# Patient Record
Sex: Female | Born: 1970 | Race: White | Hispanic: Yes | Marital: Married | Smoking: Never smoker
Health system: Southern US, Community
[De-identification: ages and names within clinical notes are randomized; demographics above are authoritative.]

## PROBLEM LIST (undated history)

## (undated) DIAGNOSIS — C539 Malignant neoplasm of cervix uteri, unspecified: Secondary | ICD-10-CM

## (undated) DIAGNOSIS — I8390 Asymptomatic varicose veins of unspecified lower extremity: Secondary | ICD-10-CM

## (undated) DIAGNOSIS — Z8742 Personal history of other diseases of the female genital tract: Secondary | ICD-10-CM

## (undated) HISTORY — DX: Asymptomatic varicose veins of unspecified lower extremity: I83.90

---

## 2003-07-25 ENCOUNTER — Inpatient Hospital Stay (HOSPITAL_COMMUNITY): Admission: AD | Admit: 2003-07-25 | Discharge: 2003-07-28 | Payer: Self-pay | Admitting: *Deleted

## 2003-07-25 ENCOUNTER — Encounter (INDEPENDENT_AMBULATORY_CARE_PROVIDER_SITE_OTHER): Payer: Self-pay | Admitting: *Deleted

## 2004-11-09 ENCOUNTER — Encounter (INDEPENDENT_AMBULATORY_CARE_PROVIDER_SITE_OTHER): Payer: Self-pay | Admitting: Nurse Practitioner

## 2004-11-09 LAB — CONVERTED CEMR LAB

## 2004-11-13 ENCOUNTER — Encounter (INDEPENDENT_AMBULATORY_CARE_PROVIDER_SITE_OTHER): Payer: Self-pay | Admitting: Nurse Practitioner

## 2004-11-13 LAB — CONVERTED CEMR LAB: Chlamydia, DNA Probe: NEGATIVE

## 2007-10-29 ENCOUNTER — Ambulatory Visit (HOSPITAL_COMMUNITY): Admission: RE | Admit: 2007-10-29 | Discharge: 2007-10-29 | Payer: Self-pay | Admitting: Obstetrics

## 2007-12-27 ENCOUNTER — Inpatient Hospital Stay (HOSPITAL_COMMUNITY): Admission: AD | Admit: 2007-12-27 | Discharge: 2007-12-27 | Payer: Self-pay | Admitting: Obstetrics

## 2007-12-27 ENCOUNTER — Inpatient Hospital Stay (HOSPITAL_COMMUNITY): Admission: AD | Admit: 2007-12-27 | Discharge: 2007-12-30 | Payer: Self-pay | Admitting: Obstetrics

## 2007-12-28 ENCOUNTER — Encounter: Payer: Self-pay | Admitting: Obstetrics & Gynecology

## 2008-08-15 ENCOUNTER — Encounter (INDEPENDENT_AMBULATORY_CARE_PROVIDER_SITE_OTHER): Payer: Self-pay | Admitting: Internal Medicine

## 2008-08-15 ENCOUNTER — Ambulatory Visit: Payer: Self-pay | Admitting: Nurse Practitioner

## 2008-08-15 LAB — CONVERTED CEMR LAB
Glucose, Urine, Semiquant: NEGATIVE
Nitrite: NEGATIVE
Protein, U semiquant: NEGATIVE

## 2008-08-16 ENCOUNTER — Encounter (INDEPENDENT_AMBULATORY_CARE_PROVIDER_SITE_OTHER): Payer: Self-pay | Admitting: Nurse Practitioner

## 2008-08-23 ENCOUNTER — Encounter (INDEPENDENT_AMBULATORY_CARE_PROVIDER_SITE_OTHER): Payer: Self-pay | Admitting: Nurse Practitioner

## 2008-09-05 ENCOUNTER — Encounter (INDEPENDENT_AMBULATORY_CARE_PROVIDER_SITE_OTHER): Payer: Self-pay | Admitting: Nurse Practitioner

## 2008-09-19 ENCOUNTER — Ambulatory Visit: Payer: Self-pay | Admitting: Nurse Practitioner

## 2008-09-19 ENCOUNTER — Encounter (INDEPENDENT_AMBULATORY_CARE_PROVIDER_SITE_OTHER): Payer: Self-pay | Admitting: Nurse Practitioner

## 2008-09-19 LAB — CONVERTED CEMR LAB
ALT: 10 units/L (ref 0–35)
AST: 13 units/L (ref 0–37)
Basophils Absolute: 0 10*3/uL (ref 0.0–0.1)
Basophils Relative: 1 % (ref 0–1)
Bilirubin Urine: NEGATIVE
Calcium: 8.9 mg/dL (ref 8.4–10.5)
Chlamydia, DNA Probe: NEGATIVE
Chloride: 107 meq/L (ref 96–112)
Creatinine, Ser: 0.62 mg/dL (ref 0.40–1.20)
KOH Prep: NEGATIVE
LDL Cholesterol: 88 mg/dL (ref 0–99)
MCHC: 31.6 g/dL (ref 30.0–36.0)
Monocytes Absolute: 0.5 10*3/uL (ref 0.1–1.0)
Neutro Abs: 3.5 10*3/uL (ref 1.7–7.7)
Neutrophils Relative %: 58 % (ref 43–77)
Potassium: 4.8 meq/L (ref 3.5–5.3)
RDW: 13.5 % (ref 11.5–15.5)
Specific Gravity, Urine: 1.015
TSH: 0.898 microintl units/mL (ref 0.350–4.50)
Total CHOL/HDL Ratio: 3.1
pH: 6.5

## 2008-09-20 ENCOUNTER — Encounter (INDEPENDENT_AMBULATORY_CARE_PROVIDER_SITE_OTHER): Payer: Self-pay | Admitting: Nurse Practitioner

## 2008-10-06 ENCOUNTER — Ambulatory Visit: Payer: Self-pay | Admitting: Nurse Practitioner

## 2009-09-19 ENCOUNTER — Ambulatory Visit: Payer: Self-pay | Admitting: Nurse Practitioner

## 2009-09-19 DIAGNOSIS — R3129 Other microscopic hematuria: Secondary | ICD-10-CM | POA: Insufficient documentation

## 2009-09-19 LAB — CONVERTED CEMR LAB
ALT: 10 U/L
AST: 12 U/L
Albumin: 4 g/dL
Alkaline Phosphatase: 87 U/L
BUN: 18 mg/dL
Basophils Absolute: 0 10*3/uL
Basophils Relative: 0 %
CO2: 26 meq/L
Calcium: 8.4 mg/dL
Chlamydia, DNA Probe: NEGATIVE
Chloride: 106 meq/L
Cholesterol: 154 mg/dL
Creatinine, Ser: 0.63 mg/dL
Eosinophils Absolute: 0.1 10*3/uL
Eosinophils Relative: 1 %
GC Probe Amp, Genital: NEGATIVE
Glucose, Bld: 82 mg/dL
Glucose, Urine, Semiquant: NEGATIVE
HCT: 41.1 %
HDL: 48 mg/dL
Hemoglobin: 12.9 g/dL
KOH Prep: NEGATIVE
Ketones, urine, test strip: NEGATIVE
LDL Cholesterol: 90 mg/dL
Lymphocytes Relative: 33 %
Lymphs Abs: 2.2 10*3/uL
MCHC: 31.4 g/dL
MCV: 89 fL
Monocytes Absolute: 0.5 10*3/uL
Monocytes Relative: 7 %
Neutro Abs: 3.9 10*3/uL
Neutrophils Relative %: 59 %
Platelets: 179 10*3/uL
Potassium: 4.5 meq/L
RBC: 4.62 M/uL
RDW: 13.8 %
Sodium: 141 meq/L
Specific Gravity, Urine: 1.02
TSH: 1.426 u[IU]/mL
Total Bilirubin: 0.5 mg/dL
Total CHOL/HDL Ratio: 3.2
Total Protein: 7.2 g/dL
Triglycerides: 78 mg/dL
VLDL: 16 mg/dL
WBC: 6.7 10*3/uL
pH: 7

## 2009-09-20 ENCOUNTER — Ambulatory Visit: Payer: Self-pay | Admitting: Nurse Practitioner

## 2009-10-27 ENCOUNTER — Ambulatory Visit: Payer: Self-pay | Admitting: Nurse Practitioner

## 2009-10-27 DIAGNOSIS — IMO0002 Reserved for concepts with insufficient information to code with codable children: Secondary | ICD-10-CM | POA: Insufficient documentation

## 2009-10-27 DIAGNOSIS — B354 Tinea corporis: Secondary | ICD-10-CM | POA: Insufficient documentation

## 2010-09-23 ENCOUNTER — Encounter: Payer: Self-pay | Admitting: Obstetrics

## 2010-10-02 NOTE — Letter (Signed)
Summary: *HSN Results Follow up  [facility]  [address] [place], [place] [address]   Phone: [phone]  Fax: [phone]      09/20/2009   Krista Cross [address] [place], [place]  [address]   Dear  Ms. Krista Cross,                            ____S.Drinkard,FNP   ____D. Gore,FNP       ____B. McPherson,MD   ____V. Rankins,MD    ____E. Mulberry,MD    _X___N. Daphine Deutscher, FNP  ____D. Reche Dixon, MD    ____K. Philipp Deputy, MD    ____Other     This letter is to inform you that your recent test(s):  ___X____Pap Smear    ___X____Lab Test     _______X-ray    ___X____ is within acceptable limits  _______ requires a medication change  _______ requires a follow-up lab visit  _______ requires a follow-up visit with your provider   Comments: Labs done during recent office visit are normal.  Pap Smear results _____________________________.     _________________________________________________________ If you have any questions, please contact our office [phone].                    Sincerely,    Lehman Prom FNP [facility]

## 2010-10-02 NOTE — Progress Notes (Signed)
Summary: Office Visit/DEPRESSION SCREENING  Office Visit/DEPRESSION SCREENING   Imported By: Arta Bruce 11/02/2009 12:55:22  _____________________________________________________________________  External Attachment:    Type:   Image     Comment:   External Document

## 2010-10-02 NOTE — Assessment & Plan Note (Signed)
Summary: CLEANING OF LEFT EAR///GK  Nurse Visit   Allergies: No Known Drug Allergies  Orders Added: 1)  Est. Patient Nurse visit [09003] 2)  Cerumen Impaction Removal [16109] Pt tolerated procedure well................... Tiffany McCoy CMA  September 20, 2009 10:10 AM

## 2010-10-02 NOTE — Assessment & Plan Note (Signed)
Summary: Complete Physical Exam   Vital Signs:  Patient profile:   40 year old female Menstrual status:  regular LMP:     08/22/2009 Height:      58.50 inches Weight:      139.1 pounds BMI:     28.68 BSA:     1.57 Temp:     98.0 degrees F oral Pulse rate:   77 / minute Pulse rhythm:   regular Resp:     20 per minute BP sitting:   94 / 65  (left arm) Cuff size:   regular  Vitals Entered By: Levon Hedger (September 19, 2009 9:23 AM) CC: CPP Is Patient Diabetic? No Pain Assessment Patient in pain? no       Does patient need assistance? Functional Status Self care Ambulation Normal LMP (date): 08/22/2009 LMP - Character: normal     Menstrual flow (days): 6 Menstrual Status regular Enter LMP: 08/22/2009 Last PAP Result  Specimen Adequacy: Satisfactory for evaluation.   Interpretation/Result:Negative for intraepithelial Lesion or Malignancy.      CC:  CPP.  History of Present Illness:  Pt into the office for a complete physical exam  PAP - done last year. All normal PAP's S/p tubal ligation 4 children Regular menses - monthly No family hx of cervical or ovarian cancer  Mammogram - never had a mammogram. No family hx of breast cancer  Optho - no glasses or contacts. no current problems with her eyes  Dental - no recent dental exam.  She has been but it has been over 1 year.  tdap - up to date; done last in 2004  No current medications.  No acute problems today  Habits & Providers  Alcohol-Tobacco-Diet     Alcohol drinks/day: 0     Tobacco Status: never  Exercise-Depression-Behavior     Does Patient Exercise: yes     Exercise Counseling: to improve exercise regimen     Type of exercise: gym     Have you felt down or hopeless? no     Have you felt little pleasure in things? no     Depression Counseling: not indicated; screening negative for depression     Drug Use: no     Seat Belt Use: 100     Sun Exposure: occasionally  Comments: PHQ- 9  score = 1  Medications Prior to Update: 1)  None  Allergies (verified): No Known Drug Allergies  Review of Systems General:  Denies fever. Eyes:  Denies blurring. ENT:  Denies earache. CV:  Denies chest pain or discomfort. Resp:  Denies cough. GI:  Denies abdominal pain, constipation, nausea, and vomiting. GU:  Denies discharge. MS:  Denies joint pain. Derm:  Denies rash. Neuro:  Denies headaches. Psych:  Denies anxiety and depression. Endo:  Denies excessive urination.  Physical Exam  General:  alert.   Head:  normocephalic.   Eyes:  vision grossly intact, pupils equal, and pupils round.   Ears:  left ear - cerumen impaction right ear - TM visible, no erythema Nose:  no external deformity.   Mouth:  pharynx pink and moist and fair dentition.   Neck:  supple.   Chest Wall:  no mass.   Breasts:  skin/areolae normal, no masses, and no abnormal thickening.   Lungs:  normal breath sounds.   Heart:  normal rate and regular rhythm.   Abdomen:  soft, non-tender, and normal bowel sounds.   Rectal:  no external abnormalities.    Pelvic Exam  Vulva:  normal appearance.   Urethra and Bladder:      Urethra--normal.   Vagina:      physiologic discharge.   Cervix:      left side deviation Uterus:      smooth.   Adnexa:      nontender bilaterally.      Impression & Recommendations:  Problem # 1:  ROUTINE GYNECOLOGICAL EXAMINATION (ICD-V72.31) PAP done labs done PHQ- 9 score is 1 rec optho and dental exams self breast exam handout given Orders: UA Dipstick w/o Micro (manual) (16109) T-Lipid Profile (60454-09811) T-Comprehensive Metabolic Panel [phone]) T-CBC w/Diff (13086-57846) T-HIV Antibody  (Reflex) [phone]) T-TSH [phone]) T- GC Chlamydia (36644) KOH/ WET Mount [phone]) Pap Smear, Thin Prep ( Collection of) (Q5956) T-Culture, Urine ([id number])  Problem # 2:  NEED PROPHYLACTIC VACCINATION&INOCULATION FLU (ICD-V04.81) given today in  office  Other Orders: Flu Vaccine 63yrs + (51884) Admin 1st Vaccine (16606) Admin 1st Vaccine [facility]) (30160F)  Patient Instructions: 1)  You will be informed of any abnormal lab results. 2)  Schedule a nurse visit for cleaning of your left ear. 3)  Follow up for a complete physical in 1 year or sooner if necessary  Laboratory Results   Urine Tests  Date/Time Received: September 19, 2009 9:32 AM  Date/Time Reported: September 19, 2009 9:32 AM   Routine Urinalysis   Color: yellow Appearance: Clear Glucose: negative   (Normal Range: Negative) Bilirubin: negative   (Normal Range: Negative) Ketone: negative   (Normal Range: Negative) Spec. Gravity: 1.020   (Normal Range: 1.003-1.035) Blood: trace-lysed   (Normal Range: Negative) pH: 7.0   (Normal Range: 5.0-8.0) Protein: trace   (Normal Range: Negative) Urobilinogen: 0.2   (Normal Range: 0-1) Nitrite: negative   (Normal Range: Negative) Leukocyte Esterace: negative   (Normal Range: Negative)    Date/Time Received: September 19, 2009 1:46 PM   Wet Mount/KOH Source: vaginal WBC/hpf: 1-5 Bacteria/hpf: rare Clue cells/hpf: none Yeast/hpf: none Trichomonas/hpf: none    Prevention & Chronic Care Immunizations   Influenza vaccine: Fluvax 3+  (09/19/2009)   Influenza vaccine due: 05/03/2010    Tetanus booster: 09/02/2002: historical per pt    Pneumococcal vaccine: Not documented  Other Screening   Pap smear:  Specimen Adequacy: Satisfactory for evaluation.   Interpretation/Result:Negative for intraepithelial Lesion or Malignancy.     (09/19/2008)   Pap smear action/deferral: PAP smear done  (09/19/2008)   Pap smear due: 09/19/2010   Smoking status: never  (09/19/2009)  Lipids   Total Cholesterol: 150  (09/19/2008)   Lipid panel action/deferral: Lipid Panel ordered   LDL: 88  (09/19/2008)   LDL Direct: Not documented   HDL: 49  (09/19/2008)   Triglycerides: 63  (09/19/2008)   Nursing Instructions: Give Flu  vaccine today    Influenza Vaccine    Vaccine Type: Fluvax 3+    Site: right deltoid    Mfr: Sanofi Pasteur    Dose: 0.5 ml    Route: IM    Given by: Levon Hedger    Exp. Date: 03/01/2010    Lot #: [id number]    VIS given: 03/26/07 version given September 19, 2009.  Flu Vaccine Consent Questions    Do you have a history of severe allergic reactions to this vaccine? no    Any prior history of allergic reactions to egg and/or gelatin? no    Do you have a sensitivity to the preservative Thimersol? no    Do you have a past history of Guillan-Barre Syndrome? no  Do you currently have an acute febrile illness? no    Have you ever had a severe reaction to latex? no    Vaccine information given and explained to patient? yes    Are you currently pregnant? no   Laboratory Results   Urine Tests    Routine Urinalysis   Color: yellow Appearance: Clear Glucose: negative   (Normal Range: Negative) Bilirubin: negative   (Normal Range: Negative) Ketone: negative   (Normal Range: Negative) Spec. Gravity: 1.020   (Normal Range: 1.003-1.035) Blood: trace-lysed   (Normal Range: Negative) pH: 7.0   (Normal Range: 5.0-8.0) Protein: trace   (Normal Range: Negative) Urobilinogen: 0.2   (Normal Range: 0-1) Nitrite: negative   (Normal Range: Negative) Leukocyte Esterace: negative   (Normal Range: Negative)      Wet Mount Wet Mount KOH: Negative

## 2010-10-02 NOTE — Letter (Signed)
Summary: Handout Printed  Printed Handout:  - Ringworm - Body (Tinea Corporis) 

## 2010-10-02 NOTE — Assessment & Plan Note (Signed)
Summary: Acute - Tinea Corporis   Vital Signs:  Patient profile:   40 year old female Menstrual status:  regular Weight:      137.3 pounds Temp:     98.3 degrees F oral Pulse rate:   82 / minute Pulse rhythm:   regular Resp:     20 per minute BP sitting:   92 / 61  (left arm) Cuff size:   regular  Vitals Entered By: Levon Hedger (October 27, 2009 2:15 PM)  History of Present Illness:  Pt into the office with complaints of pain in her back. Right mid - back present for the past week. Denies nausea or vomiting No fever Denies any heavy lifting or straining No previous history of pain in this area in the past  Skin rash - presented about 2 weeks ago.  She has been using a cream to the affected area. She has been applying vasoline to the affected area Only itching when she applies the cream otherwise no itching area on left upper arm present initially +discoloration Reports that daughter had a similar rash on her face and her provider gave her cream which she used and it improved.  Pt states that she used some of her daughter's cream but her areas did not resolve. No pets  Pt is not employed outside her home  Spanish speaking  Allergies: No Known Drug Allergies  Review of Systems General:  Denies fever. CV:  Denies chest pain or discomfort. Resp:  Denies cough. GI:  Denies abdominal pain, nausea, and vomiting. MS:  left posterior shoulder. Derm:  Complains of lesion(s) and rash; rash to arm and legs.  Physical Exam  General:  alert.   Head:  normocephalic.   Eyes:  vision grossly intact, pupils equal, and pupils round.   Lungs:  normal breath sounds.   Heart:  normal rate and regular rhythm.   Msk:  tenderness with palpation of right scapula active Rom  Neurologic:  alert & oriented X3.   Skin:  left upper arm (1)  right upper arm (1) right lower ext (1) circumscribed, central clearing Psych:  Oriented X3.     Impression & Recommendations:  Problem  # 1:  TINEA CORPORIS (ICD-110.5) handout given pt advised of Dx  Problem # 2:  SPRAIN&STRAIN UNSPEC SITE SHOULDER&UPPER ARM (ICD-840.9) anti-inflammatories as needed   Complete Medication List: 1)  Ibuprofen 800 Mg Tabs (Ibuprofen) .... One tablet by mouth two times a day as needed for pain 2)  Clotrimazole-betamethasone 1-0.05 % Crea (Clotrimazole-betamethasone) .... One application topically two times a day to affected area until healed  Patient Instructions: 1)  Rash - apply to affected area two times a day  2)  right shoulder - most likely from overuse 3)  Take ibuprofen twice daily for 2 days then as needed 4)  Follow up as needed Prescriptions: CLOTRIMAZOLE-BETAMETHASONE 1-0.05 % CREA (CLOTRIMAZOLE-BETAMETHASONE) One application topically two times a day to affected area until healed  #30gm x 1   Entered and Authorized by:   Lehman Prom FNP   Signed by:   Lehman Prom FNP on 10/27/2009   Method used:   Print then Give to Patient   RxID:   [id number] IBUPROFEN 800 MG TABS (IBUPROFEN) One tablet by mouth two times a day as needed for pain  #30 x 0   Entered and Authorized by:   Lehman Prom FNP   Signed by:   Lehman Prom FNP on 10/27/2009   Method used:  Print then Give to Patient   RxID:   [id number]

## 2011-01-15 NOTE — Op Note (Signed)
Krista Cross, Krista Cross NO.:  [id number]   MEDICAL RECORD NO.:  [id number]          PATIENT TYPE:  INP   LOCATION:  9141                          FACILITY:  [facility]   PHYSICIAN:  Roseanna Rainbow, M.D.DATE OF BIRTH:  22-Jul-1971   DATE OF PROCEDURE:  12/28/2007  DATE OF DISCHARGE:                               OPERATIVE REPORT   PREOPERATIVE DIAGNOSES:  1. Intrauterine pregnancy at 35+ weeks.  2. Rule out abruptio placentae.  3. History of previous cesarean delivery.  4. Suspicious fetal heart tracing with variable decelerations.  5. Multiparity, desires a sterilization procedure.   PROCEDURES:  1. Repeat low uterine flap elliptical cesarean delivery.  2. Bilateral tubal ligation.   SURGEON:  Roseanna Rainbow, M.D.   ANESTHESIA:  Epidural.   PATHOLOGY:  Placenta.   ESTIMATED BLOOD LOSS:  800 mL.   PROCEDURE:  The patient was taken to the operating room with an IV  running.  She was placed in the dorsal supine position with a leftward  tilt and prepped and draped in the usual sterile fashion.  After the  time-out had been completed, the previous scar was then incised with the  scalpel and carried down to the underlying fascia.  The fascia was  nicked in the midline.  The fascial incision was then extended  bilaterally.  The superior aspect of the fascial incision was tented up  and the underlying rectus muscles dissected off.  The inferior aspect of  the fascial incision was manipulated in a similar fashion.  The rectus  muscles were separated in the midline.  The parietal peritoneum was  tented up and entered sharply.  This incision was then extended  superiorly and inferiorly with good visualization of the bladder.  There  were adhesions involving the bladder flap.  The vesicouterine peritoneum  was tented up and entered sharply.  This incision was then extended  bilaterally and the bladder flap created sharply.  The bladder blade was  placed.  The  lower uterine segment was then incised in a transverse  fashion with the scalpel.  The incision was then extended bluntly.  The  infant's head was delivered atraumatically.  The oropharynx was  suctioned with the bulb suction.  There was a loose nuchal cord noted.  The infant was then handed off to the awaiting neonatologist.  An  umbilical artery pH was sent.  The placenta was then removed.  The  uterus was then evacuated of any remaining amniotic fluid, clots and  debris with a moistened laparotomy sponge.  The uterine incision was  then reapproximated in a running interlocking fashion using 0 Monocryl.  There was extension of the left margin of the uterine incision into the  broad ligament and there was a bleeder noted, likely a branch of the  uterine vessels.  The uterus was then exteriorized and a figure-of-eight  O'Leary stitch was placed.  Adequate hemostasis was noted.  The left  fallopian tube was then grasped with a Babcock clamp.  A 2-cm segment of  tube was doubly ligated with 0 plain and excised.  Adequate hemostasis  was noted.  The midisthmic  portion of the right fallopian tube was then  grasped with a Babcock clamp.  A window was made in the mesosalpinx.  The afferent and efferent ends of the tube were tied off using 0 plain  sutures.  The intervening segment of tube approximately 1-2 cm in length  was excised.  Adequate hemostasis was noted.  The uterus was returned to  the abdomen.  The paracolic gutters were then irrigated.  The parietal  peritoneum was then reapproximated in a running fashion using 2-0  Vicryl.  The fascia was closed in a running fashion using two running  sutures of 0 PDS.  The skin was closed in a subcuticular fashion using 3-  0 Monocryl.  At the close of the procedure the instrument and pack  counts were said to be correct x2.  The patient was taken to the PACU  awake and in stable condition.      Roseanna Rainbow, M.D.  Electronically  Signed     LAJ/MEDQ  D:  12/28/2007  T:  12/28/2007  Job:  [id number]

## 2011-01-15 NOTE — H&P (Signed)
Krista Cross, Krista Cross NO.:  [id number]   MEDICAL RECORD NO.:  [id number]          PATIENT TYPE:  INP   LOCATION:  9141                          [facility]ILITY:  [facility]   PHYSICIAN:  Roseanna Rainbow, M.D.DATE OF BIRTH:  04/19/1971   DATE OF ADMISSION:  12/27/2007  DATE OF DISCHARGE:                              HISTORY & PHYSICAL   CHIEF COMPLAINT:  The patient is a 40 year old para 3 with an estimated  date of confinement of May 27 with an intrauterine pregnancy at 35 plus  weeks, complaining of contractions.   HISTORY OF PRESENT ILLNESS:  Please see the above.   OB RISK FACTORS:  History of previous cesarean delivery.  A second  trimester ultrasound for anatomy demonstrated a placenta previa per the  patient's report with a subsequent study showing resolution of the  previa.  Chlamydia probe was negative.  Urine culture and sensitivity  November 2008:  GBS.  Pap smear negative, GC probe negative.  One-hour  GTT 135.  Hepatitis B surface antigen negative.  Hematocrit 37.3,  hemoglobin 12/  HIV nonreactive.  Platelets 197,000.  Blood type is A+,  antibody screen negative.  RPR nonreactive.  Rubella immune.  Sickle  cell is negative.   PAST OBSTETRICAL HISTORY:  In April 1991 she was delivered of a liveborn  female, 5 pounds 9 ounces, a vaginal delivery, questionable preterm  delivery.  In March 1996 she was delivered of a liveborn female, 7  pounds 1 ounce, vaginal delivery, full-term, no complications.  In  November 2004 she was delivered of a liveborn female, cesarean delivery,  full-term, 7 pounds 13 ounces.  There is a history of a spontaneous  abortion.   PAST GYN HISTORY:  There is a history of abnormal Pap smears--  >colposcopy.   PAST MEDICAL HISTORY:  No significant history of medical diseases.   PAST SURGICAL HISTORY:  Cesarean delivery, and please see the above.   SOCIAL HISTORY:  She works at [employer].  She is married, living  with her  spouse.  Does not give any significant history of alcohol  usage.  Has no significant smoking history.  Denies illicit drug use.   FAMILY HISTORY:  Ischemic heart disease, arthritis, adult-onset  diabetes, hypercholesterolemia.   PHYSICAL EXAM:  Vital signs stable, afebrile.  Fetal heart tracing reassuring.  Tocodynamometer:  Contractions every 2-  5 minutes.  STERILE VAGINAL EXAM:  There is heavy show.  The cervix is 3 cm dilated,  80% effaced.  The membranes were artificially ruptured for clear fluid.  An intrauterine pressure catheter and fetal scalp electrode were placed.   ASSESSMENT:  1. Multipara at 35+ weeks, early labor.  2. History of a placenta previa on ultrasound with documented      resolution per the patient's report, with heavy      show versus marginal separation.  3. History of a previous cesarean delivery and two vaginal deliveries.   PLAN:  Admission.  Monitor closely.      Roseanna Rainbow, M.D.  Electronically Signed     LAJ/MEDQ  D:  12/27/2007  T:  12/28/2007  Job:  [id number] 

## 2011-01-18 NOTE — Discharge Summary (Signed)
Krista Cross, Krista Cross NO.:  [id number]   MEDICAL RECORD NO.:  [id number]          PATIENT TYPE:  INP   LOCATION:  9141                          [facility]   PHYSICIAN:  Roseanna Rainbow, M.D.DATE OF BIRTH:  Dec 17, 1970   DATE OF ADMISSION:  12/27/2007  DATE OF DISCHARGE:  12/30/2007                               DISCHARGE SUMMARY   CHIEF COMPLAINT:  The patient is a 40 year old, para 3 with an estimated  date of confinement of Jan 27, 2008, with an intrauterine pregnancy at  35 plus weeks, complaining of contractions.   HOSPITAL COURSE:  Please see the dictated history and physical for  further details.  The patient was admitted felt to be in early labor.  She was noted to have heavy show versus marginal placental separation.  There were repetitive moderate variable decelerations.  At this point,  the cervical exam was 4 cm.  In the context of her previous cesarean  delivery, the decision was made to proceed with a repeat cesarean  delivery.  Please see the dictated operative summary.  In the PACU,  there was atony noted that responded to uterotonic agents.  She remained  hemodynamically stable.  On postoperative day #1, her hemoglobin was 8.  The remainder of her hospital course was uneventful.  She was discharged  to home.   DISCHARGED DIAGNOSES:  Intrauterine pregnancy at 35 plus weeks, history  of previous cesarean delivery, abruptio placentae, postpartum hemorrhage  with uterine atony.   CONDITION:  Stable.   DIET:  Regular.   ACTIVITY:  Pelvic rest and progressive activity.   MEDICATIONS:  Included Percocet.   DISPOSITION:  The patient was to follow up in the office in 2 weeks.      Roseanna Rainbow, M.D.  Electronically Signed     LAJ/MEDQ  D:  01/13/2008  T:  01/13/2008  Job:  [id number]

## 2011-01-18 NOTE — Op Note (Signed)
NAMECLORINDA, Krista Cross NO.:  [id number]   MEDICAL RECORD NO.:  [id number]                   PATIENT TYPE:  INP   LOCATION:  9139                                 FACILITY:  [facility]   PHYSICIAN:  Roseanna Rainbow, M.D.         DATE OF BIRTH:  06/23/1971   DATE OF PROCEDURE:  07/26/2003  DATE OF DISCHARGE:                                 OPERATIVE REPORT   PREOPERATIVE DIAGNOSES:  1. Term intrauterine pregnancy.  2. Arrest of dilatation, active phase of labor.   POSTOPERATIVE DIAGNOSES:  1. Term intrauterine pregnancy.  2. Arrest of dilatation, active phase of labor.   PROCEDURE:  Primary low transverse cesarean section via Pfannenstiel.   SURGEON:  Roseanna Rainbow, M.D.   ASSISTANT:  Marlinda Mike, C.N.M.   ANESTHESIA:  Epidural.   COMPLICATIONS:  None.   ESTIMATED BLOOD LOSS:  1 L.   FLUIDS AND URINE OUTPUT:  As per anesthesiology.  At the end of the  procedure there was small heme noted in the urine.   INDICATIONS:  The patient is a 40 year old para 2 at term who progressed to  6-7 cm dilatation with oxytocin without further progress.   FINDINGS:  Female infant in cephalic presentation.  Neonatology present at  delivery.  Apgars 8 and 9.  Weight 7 pounds 3 ounces.  Normal uterus, tubes,  and ovaries.   DESCRIPTION OF PROCEDURE:  The patient was taken to the operating room,  where her epidural anesthetic was found to be adequate.  She was then  prepped and draped in the normal sterile fashion in the dorsal supine  position with a leftward tilt.  A Pfannenstiel skin incision was then made  with a scalpel and carried through to the underlying layer of fascia with  the Bovie.  The fascia was incised in the midline and the incision extended  laterally with the Mayo scissors.  The superior aspect of the fascial  incision was then grasped with Kocher clamps, elevated, and the underlying  rectus muscles dissected off.  Attention was then  turned to the inferior  aspect of this incision, which was manipulated in a similar fashion.  The  rectus muscles were separated in the midline, the parietal peritoneum tented  up and entered sharply with Metzenbaum scissors.  The peritoneal incision  was then extended superiorly and inferiorly with good visualization of the  bladder.  The bladder blade was then inserted and the vesicouterine  peritoneum identified, grasped with pickups, and entered sharply with the  Metzenbaum scissors.  This incision was extended laterally and the bladder  flap created digitally.  The bladder blade was then reinserted and the lower  uterine segment incised in a transverse fashion with a scalpel.  The uterine  incision was then extended bluntly.  The bladder blade was removed and the  infant's head delivered atraumatically.  The nose and mouth were suctioned  with the bulb suction and the cord was clamped and cut.  The infant was  handed off to the awaiting neonatologist.  The placenta was then removed,  the uterus exteriorized and cleared of all clots and debris.  The uterine  incision was then repaired with 0 Monocryl in a running locked fashion.  A  second layer of the same suture was used to obtain excellent hemostasis.  There were bleeding points noted along the peritoneum of the bladder flap,  which were cauterized with the Bovie.  The gutters were cleared of all clots  and the parietal peritoneum was closed with 2-0 Vicryl.  The fascia was  reapproximated with 0 Vicryl in a running fashion.  The skin was closed with  staples.  The patient tolerated the procedure well.  Sponge, lap, and needle  counts were correct x2.  Cefotan 2 g were given at cord clamp.  The patient  was taken to the PACU in stable condition.                                               Roseanna Rainbow, M.D.    Judee Clara  D:  07/26/2003  T:  07/26/2003  Job:  [id number]

## 2011-01-18 NOTE — Discharge Summary (Signed)
NAMEHENRINE, HAYTER NO.:  [id number]   MEDICAL RECORD NO.:  [id number]                   PATIENT TYPE:  INP   LOCATION:  9139                                 [facility]ACILITY:  [facility]   PHYSICIAN:  Roseanna Rainbow, M.D.         DATE OF BIRTH:  1971/03/14   DATE OF ADMISSION:  07/25/2003  DATE OF DISCHARGE:  07/28/2003                                 DISCHARGE SUMMARY   CHIEF COMPLAINT:  The patient is a 40 year old gravida 4, para 2 with  estimated date of confinement July 24, 2003 who presents complaining of  ruptured membranes, clear fluid, and uterine contractions.   HISTORY OF PRESENT ILLNESS:  As above.   PHYSICAL EXAMINATION:  VITAL SIGNS:  Stable.  Afebrile.  HEENT:  Normocephalic, atraumatic.  LUNGS:  Clear to auscultation.  HEART:  Regular rate and rhythm.  ABDOMEN:  Gravid.  Estimated fetal weight 8 pounds.  PELVIC:  Sterile vaginal examination:  Cervix 1-2 cm dilated, 90% effaced  with the vertex at a -3 station.  EXTREMITIES:  Lower extremities nontender.   ASSESSMENT:  Multipara at term in early labor.  GBS negative.   PLAN:  Admission.  Expectant management.   HOSPITAL COURSE:  The patient was admitted, progressed to 6 cm dilatation  and at this point failed to make any further progress and she was brought to  the operating room for a cesarean delivery for arrest of dilatation active  phase.  Please see the dictated operative summary for further details.  The  remainder of her hospital course was uneventful.  She was discharged to home  on postoperative day #3 tolerating a regular diet.   DISCHARGE DIAGNOSES:  1. Intrauterine pregnancy at term.  2. Arrest of dilatation, active phase.   PROCEDURE:  Cesarean delivery.   CONDITION ON DISCHARGE:  Stable.   DIET:  Regular.   ACTIVITY:  No strenuous activity.  Pelvic rest.   MEDICATIONS:  1. Percocet.  2. Ibuprofen.  3. Micronor.  4. Prenatal vitamins.  5. Ferrous  sulfate.   DISPOSITION:  The patient is to follow up in the office in one week.                                               Roseanna Rainbow, M.D.    Judee Clara  D:  08/22/2003  T:  08/22/2003  Job:  [id number]

## 2011-05-28 LAB — CBC
HCT: 37.4
Hemoglobin: 8.1 — ABNORMAL LOW
MCHC: 33.9
MCV: 91
MCV: 92.7
Platelets: 173
RBC: 2.58 — ABNORMAL LOW
RDW: 13.8

## 2011-05-28 LAB — TYPE AND SCREEN: Antibody Screen: NEGATIVE

## 2011-12-08 LAB — HM PAP SMEAR: HM PAP: NORMAL

## 2012-12-09 LAB — HM MAMMOGRAPHY: HM MAMMO: NORMAL

## 2013-06-28 ENCOUNTER — Emergency Department (HOSPITAL_COMMUNITY)
Admission: EM | Admit: 2013-06-28 | Discharge: 2013-06-28 | Disposition: A | Discharge status: Home / Self Care | Payer: BC Managed Care – PPO | Source: Home / Self Care | Attending: Emergency Medicine | Admitting: Emergency Medicine

## 2013-06-28 ENCOUNTER — Encounter (HOSPITAL_COMMUNITY): Payer: Self-pay | Admitting: Emergency Medicine

## 2013-06-28 DIAGNOSIS — N390 Urinary tract infection, site not specified: Secondary | ICD-10-CM

## 2013-06-28 DIAGNOSIS — L509 Urticaria, unspecified: Secondary | ICD-10-CM

## 2013-06-28 LAB — POCT URINALYSIS DIP (DEVICE)
Bilirubin Urine: NEGATIVE
Glucose, UA: NEGATIVE mg/dL
Ketones, ur: NEGATIVE mg/dL
Nitrite: NEGATIVE
Protein, ur: NEGATIVE mg/dL
Specific Gravity, Urine: 1.01 (ref 1.005–1.030)
Urobilinogen, UA: 0.2 mg/dL (ref 0.0–1.0)
pH: 7 (ref 5.0–8.0)

## 2013-06-28 MED ORDER — CEPHALEXIN 500 MG PO CAPS: 500.0000 mg | ORAL_CAPSULE | Freq: Four times a day (QID) | ORAL | Status: DC

## 2013-06-28 NOTE — ED Notes (Signed)
C/o pressure and pain with urinating. Incontinence.  Mild irritation, denies vaginal discharge. No otc meds taken for symptoms. Onset last night.  Denies fever and any other symptoms.

## 2013-06-28 NOTE — ED Provider Notes (Signed)
Medical screening examination/treatment/procedure(s) were performed by non-physician practitioner and as supervising physician I was immediately available for consultation/collaboration.  Kaiyana Bedore, M.D.  Nekeya Briski C Mayla Biddy, MD 06/28/13 1444 

## 2013-06-28 NOTE — ED Provider Notes (Signed)
CSN: [id number]     Arrival date & time 06/28/13  1051 History   First MD Initiated Contact with Patient 06/28/13 1206     Chief Complaint  Patient presents with  . Dysuria    onset last night. having pressure and pain with urninating.   Marland Kitchen Urinary Incontinence   (Consider location/radiation/quality/duration/timing/severity/associated sxs/prior Treatment) HPI Comments: Period stopped yesterday. Has tubal ligation for birth control. At discharge, pt also reports that for the last 2 weeks she has had hives and itching of her face at night while she sleeps. She is fine during the day, but she knows it happens at night because she wakes up with it. The hives and itching go away by themselves, she has not tried anything to help the problem.   Patient is a 42 y.o. female presenting with dysuria. The history is provided by the patient.  Dysuria Pain quality:  Burning Pain severity:  Mild Onset quality:  Sudden Duration: since last night. Timing:  Intermittent (with urination) Progression:  Unchanged Chronicity:  New Recent urinary tract infections: no (last was 5 months ago)   Relieved by:  None tried Worsened by:  Nothing tried Ineffective treatments:  None tried Associated symptoms: abdominal pain   Associated symptoms: no fever, no flank pain, no nausea, no vaginal discharge and no vomiting     History reviewed. No pertinent past medical history. History reviewed. No pertinent past surgical history. History reviewed. No pertinent family history. History  Substance Use Topics  . Smoking status: Never Smoker   . Smokeless tobacco: Not on file  . Alcohol Use: No   OB History   Grav Para Term Preterm Abortions TAB SAB Ect Mult Living                 Review of Systems  Constitutional: Negative for fever and chills.  Gastrointestinal: Positive for abdominal pain. Negative for nausea and vomiting.  Genitourinary: Positive for dysuria and frequency. Negative for hematuria, flank  pain, vaginal discharge and vaginal pain.  Skin:       Hives and itching of face    Allergies  Review of patient's allergies indicates no known allergies.  Home Medications   Current Outpatient Rx  Name  Route  Sig  Dispense  Refill  . cephALEXin (KEFLEX) 500 MG capsule   Oral   Take 1 capsule (500 mg total) by mouth 4 (four) times daily.   20 capsule   0    BP 103/67  Pulse 83  Temp(Src) 98.1 F (36.7 C) (Oral)  Resp 16  SpO2 98%  LMP 06/22/2013 Physical Exam  Constitutional: She appears well-developed and well-nourished. No distress.  Cardiovascular: Normal rate and regular rhythm.   Pulmonary/Chest: Effort normal and breath sounds normal.  Abdominal: Soft. Bowel sounds are normal. She exhibits no distension. There is tenderness in the suprapubic area. There is no rigidity, no rebound, no guarding and no CVA tenderness.  Tenderness to palp is mild  Skin: No rash noted. Rash is not urticarial.    ED Course  Procedures (including critical care time) Labs Review Labs Reviewed  POCT URINALYSIS DIP (DEVICE) - Abnormal; Notable for the following:    Hgb urine dipstick LARGE (*)    Leukocytes, UA SMALL (*)    All other components within normal limits   Imaging Review No results found.  EKG Interpretation     Ventricular Rate:    PR Interval:    QRS Duration:   QT Interval:  QTC Calculation:   R Axis:     Text Interpretation:              MDM   1. UTI (lower urinary tract infection)   2. Hives   rx keflex 500mg  QID for 5 days #20; recommended benadryl one pill at bedtime to help relieve nighttime hives.      Cathlyn Parsons, NP 06/28/13 1259

## 2013-10-07 ENCOUNTER — Telehealth: Payer: Self-pay

## 2013-10-07 NOTE — Telephone Encounter (Signed)
Patient made aware that the provider will be out of the office on Friday, February 6 due to a family emergency and that her appt. will need to be rescheduled.  She stated understanding. Office will reschedule her appt.

## 2013-10-08 ENCOUNTER — Ambulatory Visit: Payer: BC Managed Care – PPO | Admitting: Family Medicine

## 2013-11-24 ENCOUNTER — Ambulatory Visit (INDEPENDENT_AMBULATORY_CARE_PROVIDER_SITE_OTHER): Payer: BC Managed Care – PPO

## 2013-11-24 ENCOUNTER — Encounter: Payer: Self-pay | Admitting: Podiatrist

## 2013-11-24 ENCOUNTER — Ambulatory Visit (INDEPENDENT_AMBULATORY_CARE_PROVIDER_SITE_OTHER): Payer: BC Managed Care – PPO | Admitting: Podiatrist

## 2013-11-24 VITALS — BP 99/55 | HR 93 | Resp 16 | Ht 60.0 in | Wt 140.0 lb

## 2013-11-24 DIAGNOSIS — M79673 Pain in unspecified foot: Secondary | ICD-10-CM

## 2013-11-24 DIAGNOSIS — M2040 Other hammer toe(s) (acquired), unspecified foot: Secondary | ICD-10-CM

## 2013-11-24 DIAGNOSIS — M79609 Pain in unspecified limb: Secondary | ICD-10-CM

## 2013-11-24 DIAGNOSIS — M216X9 Other acquired deformities of unspecified foot: Secondary | ICD-10-CM

## 2013-11-24 DIAGNOSIS — M216X2 Other acquired deformities of left foot: Secondary | ICD-10-CM

## 2013-11-24 DIAGNOSIS — M7150 Other bursitis, not elsewhere classified, unspecified site: Secondary | ICD-10-CM

## 2013-11-24 NOTE — Progress Notes (Signed)
   Subjective:    Patient ID: Krista Cross, female    DOB: 1971-05-25, 43 y.o.   MRN: [id number]  HPI Comments: "I have a hard bump on my foot"  Patient presents with an interpreter, c/o painful callused area plantar forefoot left for over 1 year. She stands a lot and it becomes uncomfortable. She did see a doc months ago and they treated with a "brown liquid" and said to cover for 2 days. Helped but came back.     Review of Systems  All other systems reviewed and are negative.       Objective:   Physical Exam GENERAL APPEARANCE: Alert, conversant. Appropriately groomed. No acute distress.  VASCULAR: Pedal pulses palpable at 2/4 DP and PT bilateral.  Capillary refill time is immediate to all digits,  Proximal to distal cooling it warm to warm.  Digital hair growth is present bilateral  NEUROLOGIC: sensation is intact epicritically and protectively to 5.07 monofilament at 5/5 sites bilateral.  Light touch is intact bilateral, vibratory sensation intact bilateral, achilles tendon reflex is intact bilateral.  MUSCULOSKELETAL: hammertoe of the left 2nd toe is present.  Prominent plantarflexed 2nd metatarsal is also noted causing an overlying hyperkeratotic lesion to form.  Otherwise rectus foot type is seen. DERMATOLOGIC: hyperkeratotic lesion is present submet 2 left.  A ground glass appearance is noted with a waxy plug present.  Intact integument present post debridement    Assessment & Plan:  Prominent 2nd metatarsal with porokeratotic lesion Plan:  Debrided the lesion. Recommended orthotics with an offloading pad on the submet 2 area to decrease pressure and friction on the site.  She was scanned and will return when orthotics are ready for pick up

## 2013-11-24 NOTE — Patient Instructions (Signed)
Callos y callosidades  (Corns and Calluses)  Un callo es una pequeña zona de piel más gruesa que se desarrolla en la parte superior, los costados, y las puntas de un dedo del pie. Contienen un núcleo con forma de cono que puede presionar un nervio que se encuentre debajo y causar dolor. Los callos son áreas de piel engrosada que se desarrollan en las manos, los dedos, las palmas de las manos, las plantas de los pies y los talones. Estas son las áreas que experimentan fricción o presión frecuente.   CAUSAS   Los callos son generalmente el resultado de roce (fricción) o la presión de los zapatos que son demasiado apretados o no se ajustan adecuadamente. Los callos son causados   por la fricción y la presión repetida sobre las zonas afectadas.   SÍNTOMAS  · Un crecimiento de piel dura en los dedos de los pies.  · Dolor o sensibilidad en la piel.  · A veces, enrojecimiento e hinchazón.  · Mayor dolor con el uso de zapatos ajustados.  DIAGNÓSTICO   El médico puede diagnosticar el problema haciendo un examen físico.   TRATAMIENTO   Eliminación de la causa de la fricción o la presión es generalmente el único tratamiento necesario. Sin embargo, a veces pueden utilizarse medicamentos para ayudar a ablandar las zonas endurecidas y engrosadas. Estos medicamentos incluyen apósitos de ácido salicílico y una loción de lactato de amonio al 12%. Estos medicamentos sólo deben utilizarse bajo la supervisión de su médico.   INSTRUCCIONES PARA EL CUIDADO DOMICILIARIO  · Intente eliminar la presión de la zona afectada.  · Puede proteger la piel con almohadillas para callos con forma de aro.  · Puede usar una piedra pómez o una lima de uñas metálica con suavidad para reducir el grosor del callo.  · Use calzado bien ajustado.  · Si tiene callos en las manos, utilice guantes al realizar actividades que puedan causar fricción.  · Las personas diabéticas deber controlar regularmente sus pies y comunicarse con el médico de cabecera si notan  problemas en ellos.  SOLICITE ATENCIÓN MÉDICA DE INMEDIATO SI:  · Ha aumentado el dolor, hinchazón, enrojecimiento o calor en la zona afectada.  · Su callo comienza a drenar el líquido o sangra.  · Usted no está mejorando, incluso con tratamiento.  Document Released: 05/28/2008 Document Revised: 11/11/2011  [employer]® Patient Information ©2014 [employer].

## 2013-12-06 ENCOUNTER — Telehealth: Payer: Self-pay

## 2013-12-06 NOTE — Telephone Encounter (Signed)
Confirmed appt with patient's daughter.  Patient does not speak English and will need an interpreter.  New patient  Pap- 09/19/09- negative Td-

## 2013-12-07 ENCOUNTER — Encounter: Payer: Self-pay | Admitting: Family Medicine

## 2013-12-07 ENCOUNTER — Ambulatory Visit (INDEPENDENT_AMBULATORY_CARE_PROVIDER_SITE_OTHER): Payer: BC Managed Care – PPO | Admitting: Family Medicine

## 2013-12-07 VITALS — BP 106/68 | HR 74 | Temp 98.4°F | Resp 16 | Ht 59.0 in | Wt 142.1 lb

## 2013-12-07 DIAGNOSIS — Z23 Encounter for immunization: Secondary | ICD-10-CM

## 2013-12-07 DIAGNOSIS — Z0000 Encounter for general adult medical examination without abnormal findings: Secondary | ICD-10-CM

## 2013-12-07 LAB — HEPATIC FUNCTION PANEL
ALT: 21 U/L (ref 0–35)
AST: 24 U/L (ref 0–37)
Albumin: 3.9 g/dL (ref 3.5–5.2)
Alkaline Phosphatase: 78 U/L (ref 39–117)
BILIRUBIN TOTAL: 0.6 mg/dL (ref 0.3–1.2)
Bilirubin, Direct: 0 mg/dL (ref 0.0–0.3)
TOTAL PROTEIN: 7.5 g/dL (ref 6.0–8.3)

## 2013-12-07 LAB — CBC WITH DIFFERENTIAL/PLATELET
Basophils Absolute: 0 10*3/uL (ref 0.0–0.1)
Basophils Relative: 0.6 % (ref 0.0–3.0)
EOS ABS: 0.1 10*3/uL (ref 0.0–0.7)
EOS PCT: 1.2 % (ref 0.0–5.0)
HCT: 37.8 % (ref 36.0–46.0)
Hemoglobin: 12.4 g/dL (ref 12.0–15.0)
LYMPHS PCT: 37.1 % (ref 12.0–46.0)
Lymphs Abs: 2.1 10*3/uL (ref 0.7–4.0)
MCHC: 32.9 g/dL (ref 30.0–36.0)
MCV: 84.2 fl (ref 78.0–100.0)
MONO ABS: 0.4 10*3/uL (ref 0.1–1.0)
Monocytes Relative: 7.1 % (ref 3.0–12.0)
NEUTROS PCT: 54 % (ref 43.0–77.0)
Neutro Abs: 3 10*3/uL (ref 1.4–7.7)
PLATELETS: 194 10*3/uL (ref 150.0–400.0)
RBC: 4.49 Mil/uL (ref 3.87–5.11)
RDW: 15.2 % — ABNORMAL HIGH (ref 11.5–14.6)
WBC: 5.6 10*3/uL (ref 4.5–10.5)

## 2013-12-07 LAB — BASIC METABOLIC PANEL
BUN: 14 mg/dL (ref 6–23)
CALCIUM: 8.8 mg/dL (ref 8.4–10.5)
CHLORIDE: 106 meq/L (ref 96–112)
CO2: 28 meq/L (ref 19–32)
CREATININE: 0.6 mg/dL (ref 0.4–1.2)
GFR: 122.92 mL/min (ref >60.00)
GLUCOSE: 83 mg/dL (ref 70–99)
Potassium: 3.8 mEq/L (ref 3.5–5.1)
Sodium: 138 mEq/L (ref 135–145)

## 2013-12-07 LAB — LIPID PANEL
CHOL/HDL RATIO: 3
CHOLESTEROL: 172 mg/dL (ref 0–200)
HDL: 52.8 mg/dL (ref >39.00)
LDL Cholesterol: 104 mg/dL — ABNORMAL HIGH (ref 0–99)
TRIGLYCERIDES: 77 mg/dL (ref 0.0–149.0)
VLDL: 15.4 mg/dL (ref 0.0–40.0)

## 2013-12-07 LAB — TSH: TSH: 0.81 u[IU]/mL (ref 0.35–5.50)

## 2013-12-07 NOTE — Progress Notes (Signed)
Pre visit review using our clinic review tool, if applicable. No additional management support is needed unless otherwise documented below in the visit note. 

## 2013-12-07 NOTE — Assessment & Plan Note (Signed)
Pt UTD on GYN, plans to schedule mammo.  Check labs.  Anticipatory guidance provided.

## 2013-12-07 NOTE — Patient Instructions (Signed)
Follow up in 1 year or as needed We'll notify you of your lab results and make any changes if needed Schedule your mammogram at your convenience Call with any questions or concerns Welcome!  We're glad to have you!!

## 2013-12-07 NOTE — Progress Notes (Signed)
   Subjective:    Patient ID: Krista Cross, female    DOB: 11-01-1970, 43 y.o.   MRN: [id number]  HPI New to establish.  Previous MD- [facility].  GYN- no GYN, last pap 2 yrs ago.  UTD on mammo- plans to schedule.  CPE- no concerns   Review of Systems Patient reports no vision/ hearing changes, adenopathy,fever, weight change,  persistant/recurrent hoarseness , swallowing issues, chest pain, palpitations, edema, persistant/recurrent cough, hemoptysis, dyspnea (rest/exertional/paroxysmal nocturnal), gastrointestinal bleeding (melena, rectal bleeding), abdominal pain, significant heartburn, bowel changes, GU symptoms (dysuria, hematuria, incontinence), Gyn symptoms (abnormal  bleeding, pain),  syncope, focal weakness, memory loss, numbness & tingling, skin/hair/nail changes, abnormal bruising or bleeding, anxiety, or depression.     Objective:   Physical Exam General Appearance:    Alert, cooperative, no distress, appears stated age  Head:    Normocephalic, without obvious abnormality, atraumatic  Eyes:    PERRL, conjunctiva/corneas clear, EOM's intact, fundi    benign, both eyes  Ears:    Normal TM's and external ear canals, both ears  Nose:   Nares normal, septum midline, mucosa normal, no drainage    or sinus tenderness  Throat:   Lips, mucosa, and tongue normal; teeth and gums normal  Neck:   Supple, symmetrical, trachea midline, no adenopathy;    Thyroid: no enlargement/tenderness/nodules  Back:     Symmetric, no curvature, ROM normal, no CVA tenderness  Lungs:     Clear to auscultation bilaterally, respirations unlabored  Chest Wall:    No tenderness or deformity   Heart:    Regular rate and rhythm, S1 and S2 normal, no murmur, rub   or gallop  Breast Exam:    Deferred to mammo  Abdomen:     Soft, non-tender, bowel sounds active all four quadrants,    no masses, no organomegaly  Genitalia:    Deferred to GYN  Rectal:    Extremities:   Extremities normal, atraumatic, no  cyanosis or edema  Pulses:   2+ and symmetric all extremities  Skin:   Skin color, texture, turgor normal, no rashes or lesions  Lymph nodes:   Cervical, supraclavicular, and axillary nodes normal  Neurologic:   CNII-XII intact, normal strength, sensation and reflexes    throughout          Assessment & Plan:

## 2013-12-08 ENCOUNTER — Encounter: Payer: Self-pay | Admitting: General Practice

## 2013-12-10 ENCOUNTER — Ambulatory Visit (INDEPENDENT_AMBULATORY_CARE_PROVIDER_SITE_OTHER): Payer: BC Managed Care – PPO | Admitting: Podiatrist

## 2013-12-10 ENCOUNTER — Encounter: Payer: Self-pay | Admitting: Podiatrist

## 2013-12-10 VITALS — BP 95/44 | HR 77 | Resp 18

## 2013-12-10 DIAGNOSIS — M2040 Other hammer toe(s) (acquired), unspecified foot: Secondary | ICD-10-CM

## 2013-12-10 DIAGNOSIS — M7150 Other bursitis, not elsewhere classified, unspecified site: Secondary | ICD-10-CM

## 2013-12-10 DIAGNOSIS — M216X2 Other acquired deformities of left foot: Secondary | ICD-10-CM

## 2013-12-10 DIAGNOSIS — M216X9 Other acquired deformities of unspecified foot: Secondary | ICD-10-CM

## 2013-12-10 NOTE — Progress Notes (Signed)
Better than it was on my left foot  Patient presents today for followup of soft tissue lesion submetatarsal 2 left foot and to pick up her orthotics.  Objective: Hyperkeratotic lesion present submetatarsal 2 left foot neurovascular status intact  Assessment: Plantarflexed metatarsal with porokeratotic lesion left foot  Plan: Orthotics are not ready for pick up at today's visit. We will call her when these are ready and they will be dispensed for her. Make sure they are spenco or similar soft top cover.

## 2013-12-13 LAB — VITAMIN D 1,25 DIHYDROXY
VITAMIN D 1, 25 (OH) TOTAL: 60 pg/mL (ref 18–72)
VITAMIN D3 1, 25 (OH): 60 pg/mL

## 2013-12-14 ENCOUNTER — Encounter: Payer: Self-pay | Admitting: General Practice

## 2013-12-20 LAB — HM MAMMOGRAPHY: HM MAMMO: NORMAL

## 2013-12-22 ENCOUNTER — Other Ambulatory Visit: Payer: Self-pay | Admitting: *Deleted

## 2013-12-22 DIAGNOSIS — I83893 Varicose veins of bilateral lower extremities with other complications: Secondary | ICD-10-CM

## 2013-12-22 DIAGNOSIS — M7989 Other specified soft tissue disorders: Secondary | ICD-10-CM

## 2013-12-22 DIAGNOSIS — M79609 Pain in unspecified limb: Secondary | ICD-10-CM

## 2013-12-27 ENCOUNTER — Encounter: Payer: Self-pay | Admitting: General Practice

## 2014-01-04 ENCOUNTER — Encounter: Payer: BC Managed Care – PPO | Admitting: Vascular Surgery

## 2014-01-04 ENCOUNTER — Encounter (HOSPITAL_COMMUNITY): Payer: BC Managed Care – PPO

## 2014-01-06 ENCOUNTER — Encounter: Payer: Self-pay | Admitting: Vascular Surgery

## 2014-01-07 ENCOUNTER — Ambulatory Visit: Payer: BC Managed Care – PPO | Admitting: *Deleted

## 2014-01-07 ENCOUNTER — Ambulatory Visit: Payer: BC Managed Care – PPO | Admitting: Podiatrist

## 2014-01-07 ENCOUNTER — Encounter: Payer: Self-pay | Admitting: Vascular Surgery

## 2014-01-07 ENCOUNTER — Ambulatory Visit (INDEPENDENT_AMBULATORY_CARE_PROVIDER_SITE_OTHER): Payer: BC Managed Care – PPO | Admitting: Vascular Surgery

## 2014-01-07 ENCOUNTER — Ambulatory Visit (HOSPITAL_COMMUNITY)
Admission: RE | Admit: 2014-01-07 | Discharge: 2014-01-07 | Disposition: A | Discharge status: Home / Self Care | Payer: BC Managed Care – PPO | Source: Ambulatory Visit | Attending: Vascular Surgery | Admitting: Vascular Surgery

## 2014-01-07 VITALS — BP 96/55 | HR 73 | Ht 59.0 in | Wt 141.0 lb

## 2014-01-07 DIAGNOSIS — I83893 Varicose veins of bilateral lower extremities with other complications: Secondary | ICD-10-CM | POA: Insufficient documentation

## 2014-01-07 DIAGNOSIS — M79609 Pain in unspecified limb: Secondary | ICD-10-CM

## 2014-01-07 DIAGNOSIS — M7989 Other specified soft tissue disorders: Secondary | ICD-10-CM | POA: Insufficient documentation

## 2014-01-07 DIAGNOSIS — M216X2 Other acquired deformities of left foot: Secondary | ICD-10-CM

## 2014-01-07 DIAGNOSIS — I8390 Asymptomatic varicose veins of unspecified lower extremity: Secondary | ICD-10-CM | POA: Insufficient documentation

## 2014-01-07 NOTE — Progress Notes (Signed)
   Subjective:    Patient ID: Krista Cross, female    DOB: 06-May-1971, 43 y.o.   MRN: [id number]  HPI Comments: Pt states she is improving.  Oral and written instructions were given, and encouraged pt to make a 4 week appt.  I placed orthotics in pt's athletic shoes, and informed pt she would benefit from a size larger athletic shoe.  She agreed.     Review of Systems     Objective:   Physical Exam        Assessment & Plan:

## 2014-01-07 NOTE — Patient Instructions (Signed)

## 2014-01-07 NOTE — Progress Notes (Signed)
Referred by:  Dillon Bjork, NP No address on file  Reason for referral: Painful right leg  History of Present Illness  Krista Cross is a 43 y.o. (Feb 20, 1971) female who presents with chief complaint: painful right leg.  History is obtained through a Optometrist.  Patient notes, pain in the right leg years ago, associated with extended standing.  The patient's symptoms include: burning sensation, heaviness, tiredness, and cramping in right leg,.  The patient has had no history of DVT, known history of pregnancy, known history of varicose vein, no history of venous stasis ulcers, no history of  Lymphedema and no history of skin changes in lower legs.  There is known family history of venous disorders.  The patient has never used compression stockings in the past.  Past Medical History  Diagnosis Date  . History of chicken pox   . Hx: UTI (urinary tract infection)     Past Surgical History  Procedure Laterality Date  . Cesarean section      two     History   Social History  . Marital Status: Married    Spouse Name: N/A    Number of Children: N/A  . Years of Education: N/A   Occupational History  . Not on file.   Social History Main Topics  . Smoking status: Never Smoker   . Smokeless tobacco: Not on file  . Alcohol Use: No  . Drug Use: No  . Sexual Activity: Yes    Birth Control/ Protection: None   Other Topics Concern  . Not on file   Social History Narrative  . No narrative on file    Family History  Problem Relation Age of Onset  . Hyperlipidemia Mother   . Diabetes Mother   . Hyperlipidemia Father   . Varicose Veins Sister    No current outpatient prescriptions on file.   No current facility-administered medications for this visit.   Allergies  Allergen Reactions  . Ibuprofen Swelling    REVIEW OF SYSTEMS:  (Positives checked otherwise negative)  CARDIOVASCULAR:  []  chest pain, []  chest pressure, []  palpitations, []  shortness of  breath when laying flat, []  shortness of breath with exertion,  []  pain in feet when walking, []  pain in feet when laying flat, []  history of blood clot in veins (DVT), []  history of phlebitis, []  swelling in legs, [x]  varicose veins  PULMONARY:  []  productive cough, []  asthma, []  wheezing  NEUROLOGIC:  []  weakness in arms or legs, []  numbness in arms or legs, []  difficulty speaking or slurred speech, []  temporary loss of vision in one eye, []  dizziness  HEMATOLOGIC:  []  bleeding problems, []  problems with blood clotting too easily  MUSCULOSKEL:  []  joint pain, []  joint swelling  GASTROINTEST:  []  vomiting blood, []  blood in stool     GENITOURINARY:  []  burning with urination, []  blood in urine  PSYCHIATRIC:  []  history of major depression  INTEGUMENTARY:  []  rashes, []  ulcers  CONSTITUTIONAL:  []  fever, []  chills   Physical Examination Filed Vitals:   01/07/14 1347  BP: 96/55  Pulse: 73  Height: 4\' 11"  (1.499 m)  Weight: 141 lb (63.957 kg)  SpO2: 100%   Body mass index is 28.46 kg/(m^2).  General: A&O x 3, WDWN  Head: [place]/AT  Ear/Nose/Throat: Hearing grossly intact, nares w/o erythema or drainage, oropharynx w/o Erythema/Exudate  Eyes: PERRLA, EOMI  Neck: Supple, no nuchal rigidity, no palpable LAD  Pulmonary: Sym exp, good air movt,  CTAB, no rales, rhonchi, & wheezing  Cardiac: RRR, Nl S1, S2, no Murmurs, rubs or gallops  Vascular: Vessel Right Left  Radial Palpable Palpable  Brachial Palpable Palpable  Carotid Palpable, without bruit Palpable, without bruit  Aorta Not palpable N/A  Femoral Palpable Palpable  Popliteal Not palpable Not palpable  PT Palpable Palpable  DP Palpable Palpable   Gastrointestinal: soft, NTND, -G/R, - HSM, - masses, - CVAT B  Musculoskeletal: M/S 5/5 throughout , Extremities without ischemic changes , no LDS, faint varicosities R>>L  Neurologic: CN 2-12 intact , Pain and light touch intact in extremities , Motor exam as listed  above  Psychiatric: Judgment intact, Mood & affect appropriate for pt's clinical situation  Dermatologic: See M/S exam for extremity exam, no rashes otherwise noted  Lymph : No Cervical, Axillary, or Inguinal lymphadenopathy   Non-Invasive Vascular Imaging  BLE Venous Insufficiency Duplex (Date: 01/07/2014):   RLE: no DVT and SVT, + GSV reflux, + deep venous reflux  LLE: no DVT and SVT, no GSV reflux, no deep venous reflux  Medical Decision Making  Krista Cross is a 43 y.o. female who presents with: RLE chronic venous insufficiency (C2).   Based on the patient's history and examination, I recommend: compressive therapy.  I discussed with the patient the use of her 20-30 mm thigh high compression stockings and need for 3 month trial of such.  The patient will follow up in 3 months with my partners in the [facility] for evaluation for: possible EVLA R GSV.  Thank you for allowing [place] to participate in this patient's care.  Adele Barthel, MD [facility] Office: [phone] Pager: [phone]  01/07/2014, 2:19 PM

## 2014-02-11 ENCOUNTER — Ambulatory Visit: Payer: BC Managed Care – PPO | Admitting: Podiatrist

## 2014-04-04 ENCOUNTER — Encounter: Payer: Self-pay | Admitting: Vascular Surgery

## 2014-04-05 ENCOUNTER — Ambulatory Visit (INDEPENDENT_AMBULATORY_CARE_PROVIDER_SITE_OTHER): Payer: BC Managed Care – PPO | Admitting: Vascular Surgery

## 2014-04-05 ENCOUNTER — Encounter: Payer: Self-pay | Admitting: Vascular Surgery

## 2014-04-05 VITALS — BP 93/60 | HR 85 | Resp 18 | Ht 60.0 in | Wt 142.5 lb

## 2014-04-05 DIAGNOSIS — I83893 Varicose veins of bilateral lower extremities with other complications: Secondary | ICD-10-CM

## 2014-04-05 NOTE — Progress Notes (Signed)
Problems with Activities of Daily Living Secondary to Leg Pain  1. 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  2. Krista Cross states that all activities that require prolonged standing (cooking, cleaning, standing) are difficult for her due to leg pain.       Failure of  Conservative Therapy:  1. Worn 20-30 mm Hg thigh high compression hose >3 months with no relief of symptoms.  2. Frequently elevates legs-no relief of symptoms  3. Taken Ibuprofen 600 Mg TID with no relief of symptoms.  The patient reports today for continued discussion of her venous hypertension. Krista Cross is here with the interpreter. Krista Cross reports that Krista Cross continues to have persistent discomfort in her right medial calf despite the elevation compression and ibuprofen. Krista Cross does have an itchy itching sensation over the medial aspect of her left knee but no swelling and no pain.  Past Medical History  Diagnosis Date  . History of chicken pox   . Hx: UTI (urinary tract infection)   . Varicose veins     History  Substance Use Topics  . Smoking status: Never Smoker   . Smokeless tobacco: Not on file  . Alcohol Use: No    Family History  Problem Relation Age of Onset  . Hyperlipidemia Mother   . Diabetes Mother   . Hyperlipidemia Father   . Varicose Veins Sister     Allergies  Allergen Reactions  . Ibuprofen Swelling    No current outpatient prescriptions on file.  BP 93/60  Pulse 85  Resp 18  Ht 5' (1.524 m)  Wt 142 lb 8 oz (64.638 kg)  BMI 27.83 kg/m2  Body mass index is 27.83 kg/(m^2).       Physical exam Krista Cross does have some small tributary I. reticular veins over the posterior right calf and scattered telangiectasia or both lower extremities.  I reimaged her saphenous veins. This does show enlarged saphenous vein on the right leg from the knee up to the saphenofemoral junction.  I feel that Krista Cross has failed conservative treatment. I have  discussed the option for laser ablation of her right great saphenous vein for improvement of her venous hypertension. Think is an outpatient procedure under local anesthesia. Krista Cross wishes to proceed for symptom relief. I did explain to the telangiectasia with the sclerotherapy which began as a separate setting. I explained this would be done for appearance reasons it would not be covered by insurance. Krista Cross wish to proceed with right leg ablation as soon as possible

## 2014-04-13 ENCOUNTER — Telehealth: Payer: Self-pay | Admitting: Vascular Surgery

## 2014-04-13 ENCOUNTER — Other Ambulatory Visit: Payer: Self-pay | Admitting: *Deleted

## 2014-04-13 DIAGNOSIS — I83893 Varicose veins of bilateral lower extremities with other complications: Secondary | ICD-10-CM

## 2014-04-13 NOTE — Telephone Encounter (Signed)
Message copied by Gena Fray on Wed Apr 13, 2014  3:04 PM ------      Message from: Norberto Sorenson D      Created: Wed Apr 13, 2014  2:27 PM      Regarding: scheduling       Please schedule Rayven Kyne for post LA duplex (right leg, order in EPIC) and VV FU with Dr. Donnetta Hutching on 05-12-2014.  She will need a Spanish interpreter for the office surgery from (8A-10A) on 05-05-2014 and for the LA duplex and FU appt.with Dr. Donnetta Hutching  On 05-12-2014. Thanks!   ------

## 2014-05-04 ENCOUNTER — Encounter: Payer: Self-pay | Admitting: Vascular Surgery

## 2014-05-05 ENCOUNTER — Encounter: Payer: Self-pay | Admitting: Vascular Surgery

## 2014-05-05 ENCOUNTER — Ambulatory Visit (INDEPENDENT_AMBULATORY_CARE_PROVIDER_SITE_OTHER): Payer: BC Managed Care – PPO | Admitting: Vascular Surgery

## 2014-05-05 VITALS — BP 97/51 | HR 73 | Resp 14 | Ht 60.0 in | Wt 140.0 lb

## 2014-05-05 DIAGNOSIS — I83893 Varicose veins of bilateral lower extremities with other complications: Secondary | ICD-10-CM

## 2014-05-05 HISTORY — PX: ENDOVENOUS ABLATION SAPHENOUS VEIN W/ LASER: SUR449

## 2014-05-05 NOTE — Progress Notes (Signed)
   Laser Ablation Procedure      Date: 05/05/2014    Krista Cross DOB:Jan 23, 1971  Consent signed: Yes  Surgeon:T.F. Jabri Blancett  Procedure: Laser Ablation: right Greater Saphenous Vein  BP 97/51  Pulse 73  Resp 14  Ht 5' (1.524 m)  Wt 140 lb (63.504 kg)  BMI 27.34 kg/m2  Start time: 0845   End time: 0935  Tumescent Anesthesia: 325 cc 0.9% NaCl with 50 cc Lidocaine HCL with 1% Epi and 15 cc 8.4% NaHCO3  Local Anesthesia: 2 cc Lidocaine HCL and NaHCO3 (ratio 2:1)  Continuous Mode: 15 Total Energy 1422 Total Time1:34      Patient tolerated procedure well: Yes  Notes: Spanish interpreter was present for the procedure. Ablation from midthigh to saphenofemoral junction  Description of Procedure:  After marking the course of the saphenous vein and the secondary varicosities in the standing position, the patient was placed on the operating table in the supine position, and the right leg was prepped and draped in sterile fashion. Local anesthetic was administered, and under ultrasound guidance the saphenous vein was accessed with a micro needle and guide wire; then the micro puncture sheath was placed. A guide wire was inserted to the saphenofemoral junction, followed by a 5 french sheath.  The position of the sheath and then the laser fiber below the junction was confirmed using the ultrasound and visualization of the aiming beam.  Tumescent anesthesia was administered along the course of the saphenous vein using ultrasound guidance. Protective laser glasses were placed on the patient, and the laser was fired at 15 W continuous mode. For a total of 1422 joules.  A steri strip was applied to the puncture site.   ABD pads and thigh high compression stockings were applied.  Ace wrap bandages were applied  at the top of the saphenofemoral junction.  Blood loss was less than 15 cc.  The patient ambulated out of the operating room having tolerated the procedure well.

## 2014-05-06 ENCOUNTER — Encounter: Payer: Self-pay | Admitting: Vascular Surgery

## 2014-05-10 ENCOUNTER — Telehealth: Payer: Self-pay | Admitting: *Deleted

## 2014-05-10 NOTE — Telephone Encounter (Signed)
    05/10/2014  Time: 9:00 AM   Patient Name: Krista Cross  Patient of: T.F. Early  Procedure:Laser Ablation right greater saphenous vein 05-05-2014  Reached patient at home and checked  Her status  Yes    Comments/Actions Taken: Spoke with Baxter Kail- patient's daughter who speaks [place]).  She states her mother is doing well and not having leg discomfort or swelling.  Reviewed post procedural instructions with Baxter Kail and reminded her of her mother's post laser ablation duplex and VV FU with Dr. Donnetta Hutching on 05-12-2014.      @SIGNATURE @

## 2014-05-11 ENCOUNTER — Encounter: Payer: Self-pay | Admitting: Vascular Surgery

## 2014-05-12 ENCOUNTER — Ambulatory Visit (INDEPENDENT_AMBULATORY_CARE_PROVIDER_SITE_OTHER): Payer: BC Managed Care – PPO | Admitting: Vascular Surgery

## 2014-05-12 ENCOUNTER — Encounter: Payer: Self-pay | Admitting: Vascular Surgery

## 2014-05-12 ENCOUNTER — Ambulatory Visit (HOSPITAL_COMMUNITY)
Admission: RE | Admit: 2014-05-12 | Discharge: 2014-05-12 | Disposition: A | Discharge status: Home / Self Care | Payer: BC Managed Care – PPO | Source: Ambulatory Visit | Attending: Vascular Surgery | Admitting: Vascular Surgery

## 2014-05-12 VITALS — BP 96/66 | HR 74 | Temp 97.5°F | Resp 14 | Ht 60.0 in | Wt 140.0 lb

## 2014-05-12 DIAGNOSIS — Z9889 Other specified postprocedural states: Secondary | ICD-10-CM | POA: Insufficient documentation

## 2014-05-12 DIAGNOSIS — I83893 Varicose veins of bilateral lower extremities with other complications: Secondary | ICD-10-CM | POA: Diagnosis present

## 2014-05-12 NOTE — Progress Notes (Signed)
Today for followup of her laser ablation of right great saphenous vein. She hasn't been very compliant with her compression garments. She has a mild tenderness.  On physical exam she does have mild to moderate bruising in the area of her ablation throughout her thigh. No evidence of skin burn. No distal swelling.  Venous duplex reveals closure of her great saphenous vein from the distal insertion site to approximately 1.2 cm from the saphenofemoral junction and no evidence of DVT  Impression and plan successful laser ablation of right greater is in her usual activities. We'll were compression garments one additional week and then on a when necessary basis. She will see [place] again if she develops recurrent issues.

## 2014-12-26 LAB — HM MAMMOGRAPHY

## 2014-12-29 ENCOUNTER — Encounter: Payer: Self-pay | Admitting: General Practice

## 2015-02-07 ENCOUNTER — Telehealth: Payer: Self-pay | Admitting: Family Medicine

## 2015-02-07 NOTE — Telephone Encounter (Signed)
Pre Visit letter sent  °

## 2015-02-24 ENCOUNTER — Telehealth: Payer: Self-pay | Admitting: Behavioral Health

## 2015-02-24 ENCOUNTER — Encounter: Payer: Self-pay | Admitting: Behavioral Health

## 2015-02-24 NOTE — Telephone Encounter (Signed)
Pre-Visit Call completed with patient and chart updated.   Pre-Visit Info documented in Specialty Comments under Snap[employer].    

## 2015-02-27 ENCOUNTER — Encounter: Payer: Self-pay | Admitting: Family Medicine

## 2015-02-27 ENCOUNTER — Ambulatory Visit (INDEPENDENT_AMBULATORY_CARE_PROVIDER_SITE_OTHER): Payer: BLUE CROSS/BLUE SHIELD | Admitting: Family Medicine

## 2015-02-27 ENCOUNTER — Other Ambulatory Visit (HOSPITAL_COMMUNITY)
Admission: RE | Admit: 2015-02-27 | Discharge: 2015-02-27 | Disposition: A | Discharge status: Home / Self Care | Payer: BLUE CROSS/BLUE SHIELD | Source: Ambulatory Visit | Attending: Family Medicine | Admitting: Family Medicine

## 2015-02-27 VITALS — BP 120/80 | HR 82 | Temp 98.5°F | Resp 16 | Ht 60.0 in | Wt 139.2 lb

## 2015-02-27 DIAGNOSIS — Z124 Encounter for screening for malignant neoplasm of cervix: Secondary | ICD-10-CM | POA: Diagnosis not present

## 2015-02-27 DIAGNOSIS — Z1151 Encounter for screening for human papillomavirus (HPV): Secondary | ICD-10-CM | POA: Insufficient documentation

## 2015-02-27 DIAGNOSIS — Z01411 Encounter for gynecological examination (general) (routine) with abnormal findings: Secondary | ICD-10-CM | POA: Diagnosis present

## 2015-02-27 DIAGNOSIS — Z0000 Encounter for general adult medical examination without abnormal findings: Secondary | ICD-10-CM | POA: Diagnosis not present

## 2015-02-27 DIAGNOSIS — R87810 Cervical high risk human papillomavirus (HPV) DNA test positive: Secondary | ICD-10-CM | POA: Insufficient documentation

## 2015-02-27 NOTE — Progress Notes (Signed)
   Subjective:    Patient ID: Krista Cross, female    DOB: 02-18-1971, 44 y.o.   MRN: [id number]  HPI CPE- UTD on mammo.  Due for pap.   Review of Systems Patient reports no vision/ hearing changes, adenopathy,fever, weight change,  persistant/recurrent hoarseness , swallowing issues, chest pain, palpitations, edema, persistant/recurrent cough, hemoptysis, dyspnea (rest/exertional/paroxysmal nocturnal), gastrointestinal bleeding (melena, rectal bleeding), abdominal pain, significant heartburn, bowel changes, GU symptoms (dysuria, hematuria, incontinence), Gyn symptoms (abnormal  bleeding, pain),  syncope, focal weakness, memory loss, numbness & tingling, skin/hair/nail changes, abnormal bruising or bleeding, anxiety, or depression.     Objective:   Physical Exam  General Appearance:    Alert, cooperative, no distress, appears stated age  Head:    Normocephalic, without obvious abnormality, atraumatic  Eyes:    PERRL, conjunctiva/corneas clear, EOM's intact, fundi    benign, both eyes  Ears:    Normal TM's and external ear canals, both ears  Nose:   Nares normal, septum midline, mucosa normal, no drainage    or sinus tenderness  Throat:   Lips, mucosa, and tongue normal; teeth and gums normal  Neck:   Supple, symmetrical, trachea midline, no adenopathy;    Thyroid: no enlargement/tenderness/nodules  Back:     Symmetric, no curvature, ROM normal, no CVA tenderness  Lungs:     Clear to auscultation bilaterally, respirations unlabored  Chest Wall:    No tenderness or deformity   Heart:    Regular rate and rhythm, S1 and S2 normal, no murmur, rub   or gallop  Breast Exam:    No tenderness, masses, or nipple abnormality  Abdomen:     Soft, non-tender, bowel sounds active all four quadrants,    no masses, no organomegaly  Genitalia:    External genitalia normal, cervix normal in appearance, no CMT, uterus in normal size and position, adnexa w/out mass or tenderness, mucosa pink and  moist, no lesions or discharge present  Rectal:    Normal external appearance  Extremities:   Extremities normal, atraumatic, no cyanosis or edema  Pulses:   2+ and symmetric all extremities  Skin:   Skin color, texture, turgor normal, no rashes or lesions  Lymph nodes:   Cervical, supraclavicular, and axillary nodes normal  Neurologic:   CNII-XII intact, normal strength, sensation and reflexes    throughout          Assessment & Plan:

## 2015-02-27 NOTE — Progress Notes (Signed)
Pre visit review using our clinic review tool, if applicable. No additional management support is needed unless otherwise documented below in the visit note. 

## 2015-02-27 NOTE — Patient Instructions (Signed)
Follow up in 1 year or as needed We'll notify you of your lab results and make any changes if needed Keep up the good work on healthy diet and regular exercise- you look great! Call with any questions or concerns Have a great trip!!!

## 2015-02-28 LAB — HEPATIC FUNCTION PANEL
ALBUMIN: 4 g/dL (ref 3.5–5.2)
ALK PHOS: 83 U/L (ref 39–117)
ALT: 10 U/L (ref 0–35)
AST: 16 U/L (ref 0–37)
Bilirubin, Direct: 0 mg/dL (ref 0.0–0.3)
Total Bilirubin: 0.4 mg/dL (ref 0.2–1.2)
Total Protein: 7.5 g/dL (ref 6.0–8.3)

## 2015-02-28 LAB — LIPID PANEL
Cholesterol: 161 mg/dL (ref 0–200)
HDL: 54.4 mg/dL (ref >39.00)
LDL Cholesterol: 92 mg/dL (ref 0–99)
NONHDL: 106.6
TRIGLYCERIDES: 74 mg/dL (ref 0.0–149.0)
Total CHOL/HDL Ratio: 3
VLDL: 14.8 mg/dL (ref 0.0–40.0)

## 2015-02-28 LAB — CBC WITH DIFFERENTIAL/PLATELET
BASOS PCT: 0.7 % (ref 0.0–3.0)
Basophils Absolute: 0.1 10*3/uL (ref 0.0–0.1)
EOS ABS: 0.1 10*3/uL (ref 0.0–0.7)
Eosinophils Relative: 1.3 % (ref 0.0–5.0)
HCT: 33.2 % — ABNORMAL LOW (ref 36.0–46.0)
Hemoglobin: 10.6 g/dL — ABNORMAL LOW (ref 12.0–15.0)
Lymphocytes Relative: 40 % (ref 12.0–46.0)
Lymphs Abs: 2.8 10*3/uL (ref 0.7–4.0)
MCHC: 32 g/dL (ref 30.0–36.0)
MCV: 80 fl (ref 78.0–100.0)
MONOS PCT: 9.6 % (ref 3.0–12.0)
Monocytes Absolute: 0.7 10*3/uL (ref 0.1–1.0)
NEUTROS PCT: 48.4 % (ref 43.0–77.0)
Neutro Abs: 3.4 10*3/uL (ref 1.4–7.7)
PLATELETS: 196 10*3/uL (ref 150.0–400.0)
RBC: 4.15 Mil/uL (ref 3.87–5.11)
RDW: 15.9 % — ABNORMAL HIGH (ref 11.5–15.5)
WBC: 7 10*3/uL (ref 4.0–10.5)

## 2015-02-28 LAB — BASIC METABOLIC PANEL
BUN: 15 mg/dL (ref 6–23)
CALCIUM: 8.9 mg/dL (ref 8.4–10.5)
CHLORIDE: 105 meq/L (ref 96–112)
CO2: 28 meq/L (ref 19–32)
CREATININE: 0.65 mg/dL (ref 0.40–1.20)
GFR: 105.03 mL/min (ref >60.00)
GLUCOSE: 81 mg/dL (ref 70–99)
Potassium: 4 mEq/L (ref 3.5–5.1)
Sodium: 139 mEq/L (ref 135–145)

## 2015-02-28 LAB — TSH: TSH: 1.74 u[IU]/mL (ref 0.35–4.50)

## 2015-02-28 LAB — VITAMIN D 25 HYDROXY (VIT D DEFICIENCY, FRACTURES): VITD: 13.49 ng/mL — ABNORMAL LOW (ref 30.00–100.00)

## 2015-03-01 ENCOUNTER — Encounter: Payer: Self-pay | Admitting: General Practice

## 2015-03-01 ENCOUNTER — Other Ambulatory Visit: Payer: Self-pay | Admitting: Family Medicine

## 2015-03-01 DIAGNOSIS — D509 Iron deficiency anemia, unspecified: Secondary | ICD-10-CM

## 2015-03-01 LAB — CYTOLOGY - PAP

## 2015-03-01 MED ORDER — VITAMIN D (ERGOCALCIFEROL) 1.25 MG (50000 UNIT) PO CAPS: 50000.0000 [IU] | ORAL_CAPSULE | ORAL | Status: DC

## 2015-03-03 NOTE — Progress Notes (Signed)
Attempted to call patient at [phone] [facility]) using [employer]- interpreter # [phone], but message stated that patient was out of service area.  Will re-attempt at a later time.

## 2015-03-03 NOTE — Progress Notes (Signed)
Called patient at [phone] [facility]) with [employer], interpreter # [phone] and patient was unavailable.  Message stated to call back at a later time due to customer being out of service area.  Will re-attempt at later time.

## 2015-03-06 NOTE — Assessment & Plan Note (Signed)
Pt's PE WNL.  UTD on mammo.  Pap collected today.  Check labs.  Anticipatory guidance provided.

## 2015-03-06 NOTE — Assessment & Plan Note (Signed)
Pap collected. 

## 2015-07-06 ENCOUNTER — Encounter: Payer: Self-pay | Admitting: Family Medicine

## 2015-07-06 ENCOUNTER — Encounter: Payer: Self-pay | Admitting: General Practice

## 2015-07-06 ENCOUNTER — Ambulatory Visit (INDEPENDENT_AMBULATORY_CARE_PROVIDER_SITE_OTHER): Payer: BLUE CROSS/BLUE SHIELD | Admitting: Family Medicine

## 2015-07-06 VITALS — BP 110/78 | HR 83 | Temp 98.2°F | Resp 16 | Ht 60.0 in | Wt 139.5 lb

## 2015-07-06 DIAGNOSIS — E559 Vitamin D deficiency, unspecified: Secondary | ICD-10-CM

## 2015-07-06 DIAGNOSIS — R87619 Unspecified abnormal cytological findings in specimens from cervix uteri: Secondary | ICD-10-CM

## 2015-07-06 DIAGNOSIS — Z23 Encounter for immunization: Secondary | ICD-10-CM

## 2015-07-06 LAB — VITAMIN D 25 HYDROXY (VIT D DEFICIENCY, FRACTURES): VITD: 33.89 ng/mL (ref 30.00–100.00)

## 2015-07-06 NOTE — Assessment & Plan Note (Signed)
Noted on labs done at last visit.  Pt wants to repeat labs and determine if additional medication is required.  Encouraged pt to start daily OTC supplement.  Will follow.

## 2015-07-06 NOTE — Progress Notes (Signed)
Pre visit review using our clinic review tool, if applicable. No additional management support is needed unless otherwise documented below in the visit note. 

## 2015-07-06 NOTE — Patient Instructions (Signed)
Schedule your complete physical for after 02/27/16 We'll notify you of your lab results and make any changes if needed Start taking a daily over the counter Vit D supplement of at least 2,000 units We'll call you with your GYN appt Call with any questions or concerns If you want to join [place] at the new [place] office, any scheduled appointments will automatically transfer and we will see you at [address] [place] [address] [place], [place] [address]  Happy Thanksgiving!!

## 2015-07-06 NOTE — Assessment & Plan Note (Signed)
New.  Noted on pap done in June.  I explained her results today and she understands and is willing to proceed w/ GYN referral.

## 2015-07-06 NOTE — Progress Notes (Signed)
   Subjective:    Patient ID: Krista Cross, female    DOB: Nov 12, 1970, 44 y.o.   MRN: [id number]  HPI Vit D deficiency- pt is here today b/c she wants to repeat Vit D labs after completing 12 weeks of 50,000 units daily.  Not currently taking daily Vit D supplement.  Pt reports a little fatigue.    AGUS pap- pt had abnormal pap in June.  Multiple attempts were made to reach pt but she was in [place] at the time.  Pt needs GYN referral.     Review of Systems For ROS see HPI     Objective:   Physical Exam  Constitutional: She is oriented to person, place, and time. She appears well-developed and well-nourished. No distress.  HENT:  Head: Normocephalic and atraumatic.  Neck: Normal range of motion. Neck supple. No thyromegaly present.  Cardiovascular: Normal rate, regular rhythm, normal heart sounds and intact distal pulses.   Pulmonary/Chest: Effort normal and breath sounds normal. No respiratory distress. She has no wheezes. She has no rales.  Lymphadenopathy:    She has no cervical adenopathy.  Neurological: She is alert and oriented to person, place, and time.  Skin: Skin is warm and dry.  Psychiatric: She has a normal mood and affect. Her behavior is normal. Thought content normal.  Vitals reviewed.         Assessment & Plan:

## 2015-07-24 ENCOUNTER — Encounter: Payer: Self-pay | Admitting: Gynecology

## 2015-07-24 ENCOUNTER — Ambulatory Visit (INDEPENDENT_AMBULATORY_CARE_PROVIDER_SITE_OTHER): Payer: BLUE CROSS/BLUE SHIELD | Admitting: Gynecology

## 2015-07-24 VITALS — BP 108/70 | Ht 59.25 in | Wt 142.4 lb

## 2015-07-24 DIAGNOSIS — R896 Abnormal cytological findings in specimens from other organs, systems and tissues: Secondary | ICD-10-CM

## 2015-07-24 DIAGNOSIS — IMO0002 Reserved for concepts with insufficient information to code with codable children: Secondary | ICD-10-CM

## 2015-07-24 NOTE — Progress Notes (Signed)
    44 year old who was referred to her primary is a courtesy of her primary care physician Dr. Birdie Riddle as a result of patient's recent Pap smear which demonstrated atypical glandular cells of undetermined significance. Patient is days on her first ever menses and will reschedule this colposcopy and possible biopsy for next week when she is not menstruating. She did bring to my attention that in [place] in 2002 she had CO2 laser ablation for some form of cervical dysplasia and subsequent Paps was of been normal until recently. She has had a tubal ligation. There has been no change in sexual partners.

## 2015-07-24 NOTE — Patient Instructions (Signed)
Colposcopa - Cuidados posteriores (Colposcopy, Care After) Siga estas instrucciones durante las prximas semanas. Estas indicaciones le proporcionan informacin general acerca de cmo deber cuidarse despus del procedimiento. El mdico tambin podr darle instrucciones ms especficas. El tratamiento se ha planificado de acuerdo a las prcticas mdicas actuales, pero a veces se producen problemas. Comunquese con el mdico si tiene algn problema o tiene dudas despus del procedimiento. QU ESPERAR DESPUS DEL PROCEDIMIENTO  Despus del procedimiento, es tpico tener las siguientes sensaciones:  Clicos. Generalmente se calman en algunos minutos.  Dolor. [place].  Aturdimiento. Si esto le ocurre, recustese durante algunos minutos. Podr tener un sangrado leve o una secrecin oscura que debe detenerse en [place]. Durante este tiempo deber usar un apsito sanitario. [place] vaginales y el uso de tampones durante 3 das, o segn lo que le indique su mdico.  Tome slo medicamentos de venta libre o recetados, segn las indicaciones del mdico. No tome aspirina, ya que puede causar hemorragias.  Si utiliza pldoras anticonceptivas, contine tomndolas.  No todos los resultados estarn disponibles durante su visita. En este caso, tenga otra entrevista con su mdico para conocerlos. No suponga que es normal si no tiene noticias de su mdico o del establecimiento de salud. Es Building services engineer seguimiento de todos los [place].  Siga los consejos de su mdico con respecto a los [place], [place], visitas y [place] de control. SOLICITE ATENCIN MDICA SI:  Aparece una erupcin cutnea.  Tiene problemas con los medicamentos. SOLICITE ATENCIN MDICA DE INMEDIATO SI:  Tiene una hemorragia abundante o elimina cogulos.  Tiene fiebre.  Tiene flujo vaginal  anormal.  Tiene clicos que no se alivian luego de tomar analgsicos.  Se siente mareada, tiene vahdos o se desmaya.  Siente Research scientist (life sciences).   Esta informacin no tiene Marine scientist el consejo del mdico. Asegrese de hacerle al mdico cualquier pregunta que tenga.   Document Released: 06/09/2013 Elsevier Interactive Patient Education [employer].

## 2015-08-03 ENCOUNTER — Ambulatory Visit (INDEPENDENT_AMBULATORY_CARE_PROVIDER_SITE_OTHER): Payer: BLUE CROSS/BLUE SHIELD | Admitting: Gynecology

## 2015-08-03 ENCOUNTER — Encounter: Payer: Self-pay | Admitting: Gynecology

## 2015-08-03 VITALS — BP 112/80

## 2015-08-03 DIAGNOSIS — R87619 Unspecified abnormal cytological findings in specimens from cervix uteri: Secondary | ICD-10-CM | POA: Diagnosis not present

## 2015-08-03 NOTE — Progress Notes (Signed)
   44 year old who was referred to her primary is a courtesy of her primary care physician Dr. Birdie Riddle as a result of patient's recent Pap smear which demonstrated atypical glandular cells of undetermined significance. She was seen in the office on November 21 but she was right in the middle her menstrual cycles it was rescheduled for today.She did bring to my attention that in [place] in 2002 she had CO2 laser ablation for some form of cervical dysplasia and subsequent Paps was of been normal until recently. She has had a tubal ligation. There has been no change in sexual partners.  Recent Pap smear demonstrated the following: Diagnosis ATYPICAL GLANDULAR CELLS. SEE COMMENT. COMMENT: THERE ARE A FEW GROUPS OF ATYPICAL GLANDULAR CELLS WHICH APPEAR TO BE OF ENDOCERVICAL ORIGIN. TISSUE STUDIES, SUCH AS A CURETTAGE, MAY HELP BETTER EVALUATE THE EXTENT AND SEVERITY OF THESE ATYPICAL CELLS. JOSHUA  Patient underwent a detail colposcopic evaluation. The external genitalia, perineum, and perirectal region were inspected no lesions were seen. The speculum was then introduced into the vagina. Acetic as was applied. Systematic inspection didn't demonstrate any lesions and the vagina or fornices or ectocervix. Endocervical speculum was utilized transformation zone visualized no lesions seen. An ECC was obtained along with a separate endometrial biopsy and submitted for histological evaluation.  Assessment/plan: 44 year old patient who many years ago had CO2 laser ablation for cervical dysplasia in [place] and subsequent patches of been normal until recently at her PCPs office which demonstrated atypical gland cells of undetermined significance. No lesions seen on colposcopic evaluation. Vigorous ECC and endometrial biopsy was submitted for histological evaluation will await the results and manage accordingly. Literature information was provided.

## 2015-08-03 NOTE — Patient Instructions (Signed)
Colposcopa - Cuidados posteriores (Colposcopy, Care After) Siga estas instrucciones durante las prximas semanas. Estas indicaciones le proporcionan informacin general acerca de cmo deber cuidarse despus del procedimiento. El mdico tambin podr darle instrucciones ms especficas. El tratamiento se ha planificado de acuerdo a las prcticas mdicas actuales, pero a veces se producen problemas. Comunquese con el mdico si tiene algn problema o tiene dudas despus del procedimiento. QU ESPERAR DESPUS DEL PROCEDIMIENTO  Despus del procedimiento, es tpico tener las siguientes sensaciones:  Clicos. Generalmente se calman en algunos minutos.  Dolor. [place].  Aturdimiento. Si esto le ocurre, recustese durante algunos minutos. Podr tener un sangrado leve o una secrecin oscura que debe detenerse en [place]. Durante este tiempo deber usar un apsito sanitario. [place] vaginales y el uso de tampones durante 3 das, o segn lo que le indique su mdico.  Tome slo medicamentos de venta libre o recetados, segn las indicaciones del mdico. No tome aspirina, ya que puede causar hemorragias.  Si utiliza pldoras anticonceptivas, contine tomndolas.  No todos los resultados estarn disponibles durante su visita. En este caso, tenga otra entrevista con su mdico para conocerlos. No suponga que es normal si no tiene noticias de su mdico o del establecimiento de salud. Es Building services engineer seguimiento de todos los [place].  Siga los consejos de su mdico con respecto a los [place], [place], visitas y [place] de control. SOLICITE ATENCIN MDICA SI:  Aparece una erupcin cutnea.  Tiene problemas con los medicamentos. SOLICITE ATENCIN MDICA DE INMEDIATO SI:  Tiene una hemorragia abundante o elimina cogulos.  Tiene fiebre.  Tiene flujo vaginal  anormal.  Tiene clicos que no se alivian luego de tomar analgsicos.  Se siente mareada, tiene vahdos o se desmaya.  Siente Research scientist (life sciences).   Esta informacin no tiene Marine scientist el consejo del mdico. Asegrese de hacerle al mdico cualquier pregunta que tenga.   Document Released: 06/09/2013 Elsevier Interactive Patient Education [employer].

## 2015-08-03 NOTE — Addendum Note (Signed)
Addended by: Alen Blew on: 08/03/2015 12:04 PM   Modules accepted: Orders

## 2015-08-07 ENCOUNTER — Telehealth: Payer: Self-pay | Admitting: Gynecology

## 2015-08-07 ENCOUNTER — Telehealth: Payer: Self-pay | Admitting: *Deleted

## 2015-08-07 NOTE — Telephone Encounter (Signed)
Appointment with Dr.Rossi on 08/18/15 @ 11:45am at [facility] [place]. [address] will have claudia relay to patient. Dr.Rossi reviewed the path report and said okay for appointment on 08/18/15.

## 2015-08-07 NOTE — Telephone Encounter (Signed)
-----  Message from Terrance Mass, MD sent at 08/07/2015 12:09 PM EST ----- Krista Cross, please make appointment for this patient with Dr. Roanna Raider oncologist ASAP as a result of her aggressive endocervical cancer detected: Diagnosis 1. 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 SEE COMMENT 2. Endometrium, biopsy, uterus PROLIFERATIVE ENDOMETRIUM WITH SIMPLE HYPERPLASIA WITHOUT ATYPIA NEGATIVE FOR ATYPIA OR MALIGNANCY Microscopic Comment 1. 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 The immunohistochemistry pattern can not clearly differentiates endocervical vs endometrium carcinoma. Clinical correlation is recommended.  I have spoken with patient in [place]. I told her she needs to bring interpreter to the appointment. Make sure that either [place] or Rosemarie Ax gives her the information clearly in [place]

## 2015-08-07 NOTE — Telephone Encounter (Signed)
I spoke with Krista Cross this morning in reference to her recent biopsy of her cervix and uterus that was done because of her Pap smear done at her PCP office demonstrating atypical granular cells of undetermined significance. I explained to her that I had received a phone call from the pathologist as well indicating that her ECC showed evidence of adenocarcinoma an endometrial biopsy simple hyperplasia. I'm going to refer her to the GYN oncologist Dr. Denman George to take over her case so she is going to need possibly a radical hysterectomy. All this was discussed in [place] and Dr. Denman George will have my office note as well be able to review the pathology report as well.

## 2015-08-08 NOTE — Telephone Encounter (Signed)
Claudia informed patient.  °

## 2015-08-18 ENCOUNTER — Ambulatory Visit: Payer: BLUE CROSS/BLUE SHIELD | Attending: Gynecologic Oncology | Admitting: Gynecologic Oncology

## 2015-08-18 ENCOUNTER — Encounter: Payer: Self-pay | Admitting: Gynecologic Oncology

## 2015-08-18 VITALS — BP 106/44 | HR 84 | Temp 98.2°F | Resp 18 | Ht 59.25 in | Wt 142.3 lb

## 2015-08-18 DIAGNOSIS — C539 Malignant neoplasm of cervix uteri, unspecified: Secondary | ICD-10-CM | POA: Diagnosis not present

## 2015-08-18 DIAGNOSIS — C801 Malignant (primary) neoplasm, unspecified: Secondary | ICD-10-CM

## 2015-08-18 DIAGNOSIS — C530 Malignant neoplasm of endocervix: Secondary | ICD-10-CM | POA: Diagnosis not present

## 2015-08-18 NOTE — Patient Instructions (Signed)
Plan for a cold knife conization of the cervix on August 31, 2015 at [facility] on [address] will receive a phone call from the surgery center with instructions.  Please call for any questions or concerns.  Conizacin cervical (Conization of the Cervix) La conizacin es el corte (escisin) de una porcin del cuello del tero en forma de cono. El procedimiento se realiza a travs de la vagina, en el consultorio mdico o en una sala de operaciones. Este procedimiento se realiza generalmente cuando hay una hemorragia anormal que proviene del cuello del tero. Tambin se realiza para evaluar un [place] anormal o anormalidades observadas en el cuello durante un examen. El tejido se examina bajo para determinar si hay clulas precancerosas o cncer.  Este procedimiento no se Metallurgist periodo menstrual o Water quality scientist.  INFORME A SU MDICO:  Cualquier alergia que tenga.   Todos los [employer] [place], incluyendo vitaminas, hierbas, gotas oftlmicas, cremas y medicamentos de venta libre.   Problemas previos que usted o los [employer] de su familia hayan tenido con el uso de anestsicos.   Enfermedades de [employer].   Cirugas previas.   Padecimientos mdicos.   Su hbito de fumar.   Si existe un posible embarazo.  RIESGOS Y COMPLICACIONES  Generalmente, la conizacin del cuello uterino es un procedimiento seguro. Sin embargo, Games developer procedimiento, pueden surgir complicaciones. Las complicaciones posibles son:  Retail buyer un sangrado durante [employer] o semanas despus del procedimiento. Un ligero sangrado o manchado luego del procedimiento es normal.  Infeccin (raro).  Lesin en el cuello uterino o en los rganos que lo rodean (poco frecuente).   Problemas con la anestesia.   Aumento del riesgo de parto prematuro en futuros [employer]. ANTES DEL PROCEDIMIENTO  No debe comer ni beber nada durante las 6 - 8 horas  previas al examen.   No tome aspirina ni anticoagulantes durante la semana previa al procedimiento, o segn le hayan indicado.   Pdale a alguna persona que la lleve a su casa luego del procedimiento.  PROCEDIMIENTO Hay tres mtodos diferentes para Customer service manager conizacin del cuello del tero. Ellos son:   Mtodo con bistur fro: es este mtodo se corta una pequea muestra de tejido con forma de cono con un bistur (escalpelo) en el canal cervical y en la zona de transformacin (dnde terminan las clulas normales y [place] anormales).   El mtodo LEEP: en este mtodo se corta una pequea muestra de tejido en forma de cono con un cable delgado que quema (cauteriza) el tejido del cuello uterino con corriente Art therapist.   Tratamiento con lser: en este mtodo se corta una pequea muestra de tejido en forma de cono y luego se cauteriza con un rayo lser para evitar el sangrado.  El procedimiento se realizar como sigue:   Segn el mtodo, le administrarn medicamentos para Nature conservation officer dormir (anestesia general) o medicamentos para adormecer la zona (anestesia local). Podrn administrarle un medicamento para adormecer el cuello del tero (boqueo cervical).   Se inserta un dispositivo lubricado llamado espculo dentro de la vagina para separar las paredes. Esto ayudar a que el mdico pueda ver mejor el interior de la vagina y el cuello del tero.   El tejido se extirpar y ser examinado.   Los resultados sel procedimiento ayudarn al mdico a decidir si es Public house manager. Tambin ayudar al mdico a decidir el mejor tratamiento, si el resultado no es normal. DESPUS DEL  PROCEDIMIENTO  Si le han administrado anestesia general se sentir mareada durante 2-3 horas despus del procedimiento.   Si le han aplicado un anestsico local, le indicarn que haga reposo en el centro quirrgico o en el consultorio del mdico hasta que se encuentre estable y se sienta lista por  volver a su casa.   La recuperacin puede demorar hasta 3 semanas.   Podr sentir algunos clicos durante alrededor de 1 semana.   El sangrado o la hemorragia vaginal pueden durar entre 2 y 4 semanas.   Puede observar una secrecin de color negro que proviene de la vagina. Es la crema colocada en el cuello para Recruitment consultant. Esto es normal.    Esta informacin no tiene Marine scientist el consejo del mdico. Asegrese de hacerle al mdico cualquier pregunta que tenga.   Document Released: 05/29/2005 Document Revised: 04/21/2013 Elsevier Interactive Patient Education [employer].

## 2015-08-18 NOTE — Progress Notes (Signed)
Consult Note: Gyn-Onc  Consult was requested by Dr. Toney Rakes for the evaluation of Krista Cross 44 y.o. female with adenocarcinoma with villoglandular features on endocervical biopsy.   CC:  Chief Complaint  Patient presents with  . adenocarcinoma    New Consult    Assessment/Plan:  Ms. Krista Cross  is a 44 y.o.  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   I held a discussion with Krista Cross and her spouse with a translator present.  I explained that she has an endocervical cancer but that it is microscopic. I discussed that she will require a hysterectomy for treatment of this malignancy but that the decision for extrafascial vs radical hysterectomy is contingent upon the microscopic dimensions of the tumor. Therefore, we have scheduled Krista Cross for a cold knife conization of the cervix this month. 6 weeks after that procedure we will perform a hysterectomy (extrafascial vs radical).   I discussed risks of CKC including bleeding, infection and damage to internal organs.   HPI: Krista Cross is a 44 year old P4 who is seen in consultation at the request of Dr Toney Rakes for villoglandular adenocarcinoma of the endocervix . The patient had an AGUS pap smear in  June, 2016. 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 Colposcopy and directed biopsies as performed on 08/03/15 which revealed a normal appearing cervix. The endometrium was sampled and showed proliferative endometrium with simple hyperplasia (no atypia), and the endocervical biopsy revealed adenocarcinoma with villoglandular endometrioid features.   The patient denies abnromal bleeding, intermenstrual bleeding, abnormal dyscharge or post coital bleeding. She has a history of a pap smear in 2000 that was abnormal after which she had cryotherapy. But states that all pap smears since that time have been normal until June, 2016.  She has a past surgical history for 2 cesarean sections  and a tubal ligation. She states that she has completed child bearing.    Current Meds:  Outpatient Encounter Prescriptions as of 08/18/2015  Medication Sig  . Cholecalciferol (VITAMIN D) 2000 UNITS tablet Take 2,000 Units by mouth daily.   No facility-administered encounter medications on file as of 08/18/2015.    Allergy:  Allergies  Allergen Reactions  . Ibuprofen Swelling    Social Hx:   Social History   Social History  . Marital Status: Married    Spouse Name: N/A  . Number of Children: N/A  . Years of Education: N/A   Occupational History  . Not on file.   Social History Main Topics  . Smoking status: Never Smoker   . Smokeless tobacco: Never Used  . Alcohol Use: No  . Drug Use: No  . Sexual Activity: Yes    Birth Control/ Protection: None, Surgical   Other Topics Concern  . Not on file   Social History Narrative    Past Surgical Hx:  Past Surgical History  Procedure Laterality Date  . Cesarean section      two   . Endovenous ablation saphenous vein w/ laser Right 05-05-2014    EVLA RIGHT GREATER SAPHENOUS VEIN  BY TODD EARLY MD  . Tubal ligation      Past Medical Hx:  Past Medical History  Diagnosis Date  . History of chicken pox   . Hx: UTI (urinary tract infection)   . Varicose veins     Past Gynecological History:  SVD x2, c/s x 2  Patient's last menstrual period was 07/24/2015.  Family Hx:  Family History  Problem Relation Age of Onset  .  Hyperlipidemia Mother   . Diabetes Mother   . Varicose Veins Sister     Review of Systems:  Constitutional  Feels well,    ENT Normal appearing ears and nares bilaterally Skin/Breast  No rash, sores, jaundice, itching, dryness Cardiovascular  No chest pain, shortness of breath, or edema  Pulmonary  No cough or wheeze.  Gastro Intestinal  No nausea, vomitting, or diarrhoea. No bright red blood per rectum, no abdominal pain, change in bowel movement, or constipation.  Genito Urinary  No  frequency, urgency, dysuria, no abnormal bleeding Musculo Skeletal  No myalgia, arthralgia, joint swelling or pain  Neurologic  No weakness, numbness, change in gait,  Psychology  No depression, anxiety, insomnia.   Vitals:  Blood pressure 106/44, pulse 84, temperature 98.2 F (36.8 C), temperature source Oral, resp. rate 18, height 4' 11.25" (1.505 m), weight 142 lb 4.8 oz (64.547 kg), last menstrual period 07/24/2015, SpO2 100 %.  Physical Exam: WD in NAD Neck  Supple NROM, without any enlargements.  Lymph Node Survey No cervical supraclavicular or inguinal adenopathy Cardiovascular  Pulse normal rate, regularity and rhythm. S1 and S2 normal.  Lungs  Clear to auscultation bilateraly, without wheezes/crackles/rhonchi. Good air movement.  Skin  No rash/lesions/breakdown  Psychiatry  Alert and oriented to person, place, and time  Abdomen  Normoactive bowel sounds, abdomen soft, non-tender and thin without evidence of hernia. Back No CVA tenderness Genito Urinary  Vulva/vagina: Normal external female genitalia.  No lesions. No discharge or bleeding.  Bladder/urethra:  No lesions or masses, well supported bladder  Vagina: grossly normal  Cervix: Normal appearing, no lesions. 3cm diameter. No palpable abnormalities in lower uterine segment or endocervix.  Uterus: Small, mobile, no parametrial involvement or nodularity.  Adnexa: no palpable masses. Rectal  Good tone, no masses no cul de sac nodularity.  Extremities  No bilateral cyanosis, clubbing or edema.   Donaciano Eva, MD  08/18/2015, 3:41 PM

## 2015-08-29 ENCOUNTER — Encounter (HOSPITAL_BASED_OUTPATIENT_CLINIC_OR_DEPARTMENT_OTHER): Payer: Self-pay | Admitting: *Deleted

## 2015-08-29 NOTE — Progress Notes (Signed)
SPOKE W/ PT THRU HER DAUGHTER, PATRICIA , AS INTERPRETOR.  NPO AFTER MN.  ARRIVE AT 0630. NEEDS HG AND URINE PREG.  FEMALE SPANISH INTERPRETER ARRANGED THRU CARE MANAGEMENT TO ARRIVE AT 0615. WILL PUT CONFIRMATION IN CHART.

## 2015-08-30 ENCOUNTER — Telehealth: Payer: Self-pay

## 2015-08-30 NOTE — Anesthesia Preprocedure Evaluation (Signed)
Anesthesia Evaluation  Patient identified by MRN, date of birth, ID band Patient awake    Reviewed: Allergy & Precautions, H&P , NPO status , Patient's Chart, lab work & pertinent test results  Airway Mallampati: II  TM Distance: >3 FB Neck ROM: full    Dental no notable dental hx. (+) Dental Advisory Given, Teeth Intact   Pulmonary neg pulmonary ROS,    Pulmonary exam normal breath sounds clear to auscultation       Cardiovascular Exercise Tolerance: Good negative cardio ROS Normal cardiovascular exam Rhythm:regular Rate:Normal     Neuro/Psych negative neurological ROS  negative psych ROS   GI/Hepatic negative GI ROS, Neg liver ROS,   Endo/Other  negative endocrine ROS  Renal/GU negative Renal ROS  negative genitourinary   Musculoskeletal   Abdominal   Peds  Hematology negative hematology ROS (+)   Anesthesia Other Findings   Reproductive/Obstetrics negative OB ROS Cervical cancer                             Anesthesia Physical Anesthesia Plan  ASA: II  Anesthesia Plan: General   Post-op Pain Management:    Induction: Intravenous  Airway Management Planned: LMA  Additional Equipment:   Intra-op Plan:   Post-operative Plan:   Informed Consent: I have reviewed the patients History and Physical, chart, labs and discussed the procedure including the risks, benefits and alternatives for the proposed anesthesia with the patient or authorized representative who has indicated his/her understanding and acceptance.   Dental Advisory Given  Plan Discussed with: CRNA and Surgeon  Anesthesia Plan Comments:         Anesthesia Quick Evaluation

## 2015-08-30 NOTE — Telephone Encounter (Signed)
Incoming call , spoke with the patient's daughter re: patient is spanish speaking only . Daughter was updated that it is "ok" to proceed with conization of the cervix during her menses . Daughter states understanding , denies further questions at this time .

## 2015-08-31 ENCOUNTER — Encounter (HOSPITAL_BASED_OUTPATIENT_CLINIC_OR_DEPARTMENT_OTHER): Payer: Self-pay | Admitting: *Deleted

## 2015-08-31 ENCOUNTER — Encounter (HOSPITAL_BASED_OUTPATIENT_CLINIC_OR_DEPARTMENT_OTHER)
Admission: RE | Disposition: A | Discharge status: Home / Self Care | Payer: Self-pay | Source: Ambulatory Visit | Attending: Gynecologic Oncology

## 2015-08-31 ENCOUNTER — Ambulatory Visit (HOSPITAL_BASED_OUTPATIENT_CLINIC_OR_DEPARTMENT_OTHER): Payer: BLUE CROSS/BLUE SHIELD | Admitting: Anesthesiology

## 2015-08-31 ENCOUNTER — Ambulatory Visit (HOSPITAL_BASED_OUTPATIENT_CLINIC_OR_DEPARTMENT_OTHER)
Admission: RE | Admit: 2015-08-31 | Discharge: 2015-08-31 | Disposition: A | Discharge status: Home / Self Care | Payer: BLUE CROSS/BLUE SHIELD | Source: Ambulatory Visit | Attending: Gynecologic Oncology | Admitting: Gynecologic Oncology

## 2015-08-31 DIAGNOSIS — C539 Malignant neoplasm of cervix uteri, unspecified: Secondary | ICD-10-CM | POA: Diagnosis present

## 2015-08-31 DIAGNOSIS — Z886 Allergy status to analgesic agent status: Secondary | ICD-10-CM | POA: Diagnosis not present

## 2015-08-31 DIAGNOSIS — C530 Malignant neoplasm of endocervix: Secondary | ICD-10-CM | POA: Insufficient documentation

## 2015-08-31 HISTORY — PX: CERVICAL CONIZATION W/BX: SHX1330

## 2015-08-31 HISTORY — DX: Personal history of other diseases of the female genital tract: Z87.42

## 2015-08-31 HISTORY — DX: Malignant neoplasm of cervix uteri, unspecified: C53.9

## 2015-08-31 LAB — POCT HEMOGLOBIN-HEMACUE: HEMOGLOBIN: 10.5 g/dL — AB (ref 12.0–15.0)

## 2015-08-31 LAB — POCT PREGNANCY, URINE: Preg Test, Ur: NEGATIVE

## 2015-08-31 SURGERY — CONE BIOPSY, CERVIX
Anesthesia: General

## 2015-08-31 MED ORDER — PROPOFOL 10 MG/ML IV BOLUS
INTRAVENOUS | Status: DC | PRN
  Administered 2015-08-31: 200 mg via INTRAVENOUS

## 2015-08-31 MED ORDER — LACTATED RINGERS IV SOLN
INTRAVENOUS | Status: DC
  Filled 2015-08-31: qty 1000

## 2015-08-31 MED ORDER — LIDOCAINE HCL (CARDIAC) 20 MG/ML IV SOLN
INTRAVENOUS | Status: AC
  Filled 2015-08-31: qty 5

## 2015-08-31 MED ORDER — HYDROMORPHONE HCL 1 MG/ML IJ SOLN
0.2500 mg | INTRAMUSCULAR | Status: DC | PRN
  Filled 2015-08-31: qty 1

## 2015-08-31 MED ORDER — CEFAZOLIN SODIUM-DEXTROSE 2-3 GM-% IV SOLR
INTRAVENOUS | Status: AC
  Filled 2015-08-31: qty 50

## 2015-08-31 MED ORDER — 0.9 % SODIUM CHLORIDE (POUR BTL) OPTIME
TOPICAL | Status: DC | PRN
  Administered 2015-08-31: 500 mL

## 2015-08-31 MED ORDER — FENTANYL CITRATE (PF) 100 MCG/2ML IJ SOLN
INTRAMUSCULAR | Status: AC
  Filled 2015-08-31: qty 2

## 2015-08-31 MED ORDER — ONDANSETRON HCL 4 MG/2ML IJ SOLN
INTRAMUSCULAR | Status: AC
  Filled 2015-08-31: qty 2

## 2015-08-31 MED ORDER — FERRIC SUBSULFATE SOLN
Status: DC | PRN
  Administered 2015-08-31: 1 via TOPICAL

## 2015-08-31 MED ORDER — DOCUSATE SODIUM 100 MG PO CAPS: 100.0000 mg | ORAL_CAPSULE | Freq: Two times a day (BID) | ORAL | Status: DC

## 2015-08-31 MED ORDER — ACETIC ACID 5 % SOLN
Status: AC
  Filled 2015-08-31: qty 500

## 2015-08-31 MED ORDER — ONDANSETRON HCL 4 MG/2ML IJ SOLN
INTRAMUSCULAR | Status: DC | PRN
  Administered 2015-08-31: 4 mg via INTRAVENOUS

## 2015-08-31 MED ORDER — DEXAMETHASONE SODIUM PHOSPHATE 10 MG/ML IJ SOLN
INTRAMUSCULAR | Status: AC
  Filled 2015-08-31: qty 1

## 2015-08-31 MED ORDER — OXYCODONE-ACETAMINOPHEN 5-325 MG PO TABS: 1.0000 | ORAL_TABLET | ORAL | Status: DC | PRN

## 2015-08-31 MED ORDER — ACETIC ACID 5 % SOLN
Status: DC | PRN
  Administered 2015-08-31: 1 via TOPICAL

## 2015-08-31 MED ORDER — LACTATED RINGERS IV SOLN
INTRAVENOUS | Status: DC
  Administered 2015-08-31 (×2): via INTRAVENOUS
  Filled 2015-08-31: qty 1000

## 2015-08-31 MED ORDER — PROPOFOL 10 MG/ML IV BOLUS
INTRAVENOUS | Status: AC
  Filled 2015-08-31: qty 20

## 2015-08-31 MED ORDER — FERRIC SUBSULFATE 259 MG/GM EX SOLN
CUTANEOUS | Status: AC
  Filled 2015-08-31: qty 8

## 2015-08-31 MED ORDER — FENTANYL CITRATE (PF) 100 MCG/2ML IJ SOLN
INTRAMUSCULAR | Status: DC | PRN
  Administered 2015-08-31: 25 ug via INTRAVENOUS
  Administered 2015-08-31: 50 ug via INTRAVENOUS
  Administered 2015-08-31: 25 ug via INTRAVENOUS

## 2015-08-31 MED ORDER — DEXAMETHASONE SODIUM PHOSPHATE 10 MG/ML IJ SOLN
INTRAMUSCULAR | Status: DC | PRN
  Administered 2015-08-31: 10 mg via INTRAVENOUS

## 2015-08-31 MED ORDER — LIDOCAINE HCL (CARDIAC) 20 MG/ML IV SOLN
INTRAVENOUS | Status: DC | PRN
  Administered 2015-08-31: 60 mg via INTRAVENOUS

## 2015-08-31 MED ORDER — LIDOCAINE-EPINEPHRINE 0.5 %-1:200000 IJ SOLN
INTRAMUSCULAR | Status: DC | PRN
  Administered 2015-08-31: 10 mL

## 2015-08-31 MED ORDER — MIDAZOLAM HCL 5 MG/5ML IJ SOLN
INTRAMUSCULAR | Status: DC | PRN
  Administered 2015-08-31: 2 mg via INTRAVENOUS

## 2015-08-31 MED ORDER — MIDAZOLAM HCL 2 MG/2ML IJ SOLN
INTRAMUSCULAR | Status: AC
  Filled 2015-08-31: qty 2

## 2015-08-31 MED ORDER — SILVER NITRATE-POT NITRATE 75-25 % EX MISC
CUTANEOUS | Status: AC
  Filled 2015-08-31: qty 1

## 2015-08-31 SURGICAL SUPPLY — 31 items
APPLICATOR COTTON TIP 6IN STRL (MISCELLANEOUS) ×2 IMPLANT
CANISTER SUCTION 2500CC (MISCELLANEOUS) ×1 IMPLANT
CATH ROBINSON RED A/P 16FR (CATHETERS) ×2 IMPLANT
CLEANER CAUTERY TIP 5X5 PAD (MISCELLANEOUS) ×1 IMPLANT
CLOTH BEACON ORANGE TIMEOUT ST (SAFETY) ×1 IMPLANT
COVER BACK TABLE 60X90IN (DRAPES) ×2 IMPLANT
DRAPE LG THREE QUARTER DISP (DRAPES) ×2 IMPLANT
DRAPE UNDERBUTTOCKS STRL (DRAPE) ×2 IMPLANT
ELECT BALL LEEP 5MM RED (ELECTRODE) ×1 IMPLANT
ELECT BLADE 6.5 .24CM SHAFT (ELECTRODE) ×2 IMPLANT
ELECT REM PT RETURN 9FT ADLT (ELECTROSURGICAL) ×2
ELECTRODE REM PT RTRN 9FT ADLT (ELECTROSURGICAL) ×1 IMPLANT
GLOVE BIO SURGEON STRL SZ 6 (GLOVE) ×2 IMPLANT
GOWN STRL REUS W/ TWL LRG LVL3 (GOWN DISPOSABLE) ×1 IMPLANT
GOWN STRL REUS W/TWL LRG LVL3 (GOWN DISPOSABLE) ×2
KIT ROOM TURNOVER WOR (KITS) ×2 IMPLANT
LEGGING LITHOTOMY PAIR STRL (DRAPES) ×2 IMPLANT
NS IRRIG 500ML POUR BTL (IV SOLUTION) ×2 IMPLANT
PAD CLEANER CAUTERY TIP 5X5 (MISCELLANEOUS)
PENCIL BUTTON HOLSTER BLD 10FT (ELECTRODE) ×1 IMPLANT
SCOPETTES 8  STERILE (MISCELLANEOUS) ×1
SCOPETTES 8 STERILE (MISCELLANEOUS) ×1 IMPLANT
SPONGE SURGIFOAM ABS GEL 12-7 (HEMOSTASIS) IMPLANT
SURGIFLO ×1 IMPLANT
SURGIFLO W/THROMBIN 8M KIT (HEMOSTASIS) ×1 IMPLANT
SUT VIC AB 0 CT1 36 (SUTURE) ×2 IMPLANT
SUT VICRYL 0 UR6 27IN ABS (SUTURE) ×4 IMPLANT
SYR BULB IRRIGATION 50ML (SYRINGE) ×2 IMPLANT
TUBE CONNECTING 12X1/4 (SUCTIONS) ×2 IMPLANT
UNDERPAD 30X30 INCONTINENT (UNDERPADS AND DIAPERS) ×2 IMPLANT
YANKAUER SUCT BULB TIP NO VENT (SUCTIONS) ×2 IMPLANT

## 2015-08-31 NOTE — Anesthesia Postprocedure Evaluation (Signed)
Anesthesia Post Note  Patient: Krista Cross  Procedure(s) Performed: Procedure(s) (LRB): CONIZATION CERVIX WITH BIOPSY (N/A)  Patient location during evaluation: PACU Anesthesia Type: General Level of consciousness: awake and alert Pain management: pain level controlled Vital Signs Assessment: post-procedure vital signs reviewed and stable Respiratory status: spontaneous breathing, nonlabored ventilation, respiratory function stable and patient connected to nasal cannula oxygen Cardiovascular status: blood pressure returned to baseline and stable Postop Assessment: no signs of nausea or vomiting Anesthetic complications: no    Last Vitals:  Filed Vitals:   08/31/15 0930 08/31/15 0945  BP: 105/56 101/50  Pulse: 85 74  Temp:    Resp: 17 19    Last Pain:  Filed Vitals:   08/31/15 0948  PainSc: Asleep                 Tydarius Yawn L

## 2015-08-31 NOTE — H&P (View-Only) (Signed)
Consult Note: Gyn-Onc  Consult was requested by Dr. Toney Rakes for the evaluation of Lesly Narayan 44 y.o. female with adenocarcinoma with villoglandular features on endocervical biopsy.   CC:  Chief Complaint  Patient presents with  . adenocarcinoma    New Consult    Assessment/Plan:  Ms. Layce Ryall  is a 44 y.o.  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   I held a discussion with Jakyra and her spouse with a translator present.  I explained that she has an endocervical cancer but that it is microscopic. I discussed that she will require a hysterectomy for treatment of this malignancy but that the decision for extrafascial vs radical hysterectomy is contingent upon the microscopic dimensions of the tumor. Therefore, we have scheduled [place] for a cold knife conization of the cervix this month. 6 weeks after that procedure we will perform a hysterectomy (extrafascial vs radical).   I discussed risks of CKC including bleeding, infection and damage to internal organs.   HPI: Erdine Marcoe is a 44 year old P4 who is seen in consultation at the request of Dr Toney Rakes for villoglandular adenocarcinoma of the endocervix . The patient had an AGUS pap smear in  June, 2016. 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 Colposcopy and directed biopsies as performed on 08/03/15 which revealed a normal appearing cervix. The endometrium was sampled and showed proliferative endometrium with simple hyperplasia (no atypia), and the endocervical biopsy revealed adenocarcinoma with villoglandular endometrioid features.   The patient denies abnromal bleeding, intermenstrual bleeding, abnormal dyscharge or post coital bleeding. She has a history of a pap smear in 2000 that was abnormal after which she had cryotherapy. But states that all pap smears since that time have been normal until June, 2016.  She has a past surgical history for 2 cesarean sections  and a tubal ligation. She states that she has completed child bearing.    Current Meds:  Outpatient Encounter Prescriptions as of 08/18/2015  Medication Sig  . Cholecalciferol (VITAMIN D) 2000 UNITS tablet Take 2,000 Units by mouth daily.   No facility-administered encounter medications on file as of 08/18/2015.    Allergy:  Allergies  Allergen Reactions  . Ibuprofen Swelling    Social Hx:   Social History   Social History  . Marital Status: Married    Spouse Name: N/A  . Number of Children: N/A  . Years of Education: N/A   Occupational History  . Not on file.   Social History Main Topics  . Smoking status: Never Smoker   . Smokeless tobacco: Never Used  . Alcohol Use: No  . Drug Use: No  . Sexual Activity: Yes    Birth Control/ Protection: None, Surgical   Other Topics Concern  . Not on file   Social History Narrative    Past Surgical Hx:  Past Surgical History  Procedure Laterality Date  . Cesarean section      two   . Endovenous ablation saphenous vein w/ laser Right 05-05-2014    EVLA RIGHT GREATER SAPHENOUS VEIN  BY TODD EARLY MD  . Tubal ligation      Past Medical Hx:  Past Medical History  Diagnosis Date  . History of chicken pox   . Hx: UTI (urinary tract infection)   . Varicose veins     Past Gynecological History:  SVD x2, c/s x 2  Patient's last menstrual period was 07/24/2015.  Family Hx:  Family History  Problem Relation Age of Onset  .  Hyperlipidemia Mother   . Diabetes Mother   . Varicose Veins Sister     Review of Systems:  Constitutional  Feels well,    ENT Normal appearing ears and nares bilaterally Skin/Breast  No rash, sores, jaundice, itching, dryness Cardiovascular  No chest pain, shortness of breath, or edema  Pulmonary  No cough or wheeze.  Gastro Intestinal  No nausea, vomitting, or diarrhoea. No bright red blood per rectum, no abdominal pain, change in bowel movement, or constipation.  Genito Urinary  No  frequency, urgency, dysuria, no abnormal bleeding Musculo Skeletal  No myalgia, arthralgia, joint swelling or pain  Neurologic  No weakness, numbness, change in gait,  Psychology  No depression, anxiety, insomnia.   Vitals:  Blood pressure 106/44, pulse 84, temperature 98.2 F (36.8 C), temperature source Oral, resp. rate 18, height 4' 11.25" (1.505 m), weight 142 lb 4.8 oz (64.547 kg), last menstrual period 07/24/2015, SpO2 100 %.  Physical Exam: WD in NAD Neck  Supple NROM, without any enlargements.  Lymph Node Survey No cervical supraclavicular or inguinal adenopathy Cardiovascular  Pulse normal rate, regularity and rhythm. S1 and S2 normal.  Lungs  Clear to auscultation bilateraly, without wheezes/crackles/rhonchi. Good air movement.  Skin  No rash/lesions/breakdown  Psychiatry  Alert and oriented to person, place, and time  Abdomen  Normoactive bowel sounds, abdomen soft, non-tender and thin without evidence of hernia. Back No CVA tenderness Genito Urinary  Vulva/vagina: Normal external female genitalia.  No lesions. No discharge or bleeding.  Bladder/urethra:  No lesions or masses, well supported bladder  Vagina: grossly normal  Cervix: Normal appearing, no lesions. 3cm diameter. No palpable abnormalities in lower uterine segment or endocervix.  Uterus: Small, mobile, no parametrial involvement or nodularity.  Adnexa: no palpable masses. Rectal  Good tone, no masses no cul de sac nodularity.  Extremities  No bilateral cyanosis, clubbing or edema.   Donaciano Eva, MD  08/18/2015, 3:41 PM

## 2015-08-31 NOTE — Interval H&P Note (Signed)
History and Physical Interval Note:  08/31/2015 8:14 AM  Krista Cross  has presented today for surgery, with the diagnosis of CERVICAL CANCER  The various methods of treatment have been discussed with the patient and family. After consideration of risks, benefits and other options for treatment, the patient has consented to  Procedure(s): CONIZATION CERVIX WITH BIOPSY (N/A) as a surgical intervention .  The patient's history has been reviewed, patient examined, no change in status, stable for surgery.  I have reviewed the patient's chart and labs.  Questions were answered to the patient's satisfaction.     Donaciano Eva

## 2015-08-31 NOTE — Transfer of Care (Signed)
Immediate Anesthesia Transfer of Care Note  Patient: Krista Cross  Procedure(s) Performed: Procedure(s) (LRB): CONIZATION CERVIX WITH BIOPSY (N/A)  Patient Location: PACU  Anesthesia Type: General  Level of Consciousness: awake, sedated, patient cooperative and responds to stimulation  Airway & Oxygen Therapy: Patient Spontanous Breathing and Patient connected to face mask oxygen  Post-op Assessment: Report given to PACU RN, Post -op Vital signs reviewed and stable and Patient moving all extremities  Post vital signs: Reviewed and stable  Complications: No apparent anesthesia complications

## 2015-08-31 NOTE — Op Note (Signed)
PATIENT: Krista Cross DATE: 08/31/15   Preop Diagnosis: Cervical cancer  Postoperative Diagnosis: same  Surgery: cold knife conization of cervix   Surgeons:  Donaciano Eva, MD Assistant: none  Anesthesia: General   Estimated blood loss: <56ml  IVF:  228ml   Urine output: 123XX123 ml   Complications: None   Pathology: cervical cone with marking stitch at 12 o'clock, post cone ECC  Operative findings: grossly normal appearing size and consistency of cervix. Normal ectocervix. Palpation of the cone specimen revealed a 1cm nodule on the posterior right lateral wall of the endocervix.  Procedure: The patient was identified in the preoperative holding area. Informed consent was signed on the chart. Patient was seen history was reviewed and exam was performed.   The patient was then taken to the operating room and placed in the supine position with SCD hose on. General anesthesia was then induced without difficulty. She was then placed in the dorsolithotomy position. The perineum was prepped with Betadine. The vagina was prepped with Betadine. The patient was then draped after the prep was dried. An in and out catheterization to empty the bladder was performed under sterile conditions.  Timeout was performed the patient, procedure, antibiotic, allergy, and length of procedure.   The weighted speculum was placed in the posterior vagina. The right angle retractor was placed anteriorally to visualize the cervix. A 0-vicryl suture on a UR-6 needle was used to place stay sutures (which were tagged) at 3 and 9 o'clock of the cervicovaginal junction. 10cc of 1% lidocaine with epinephrine was infiltrated into 3 and 9 o'clock of the cervicovaginal junction, circumferentially around the face of the cervix, and deep in the endocervical canal. The uterine sound was placed in the cervix to delineate the course of the endocervical canal. An 11 blade scalpel on a long knife handle was  used to make an incision around the face of the cervix, inside of the stay sutures, circumferentially. A single tooth tenaculum grasped the specimen to manipulate it. The incision was then angled towards the endocervix to amputate the specimen. It was removed, oriented with a marking stitch at the 12 o'clock ectocervix and sent to pathology. It was measured to be 3cm deep, by 2x2cm on the ectocervical face.   A post-cone ECC was collected from the endocervical canal using a kavorkian currette.  The bovie with the paddle attachment was used at 69 coag to create hemostasis at the surgical bed. Monsels solution was applied to the surgical bed to consolidate this hemostasis. This was reinforced with surgiflow hemostatic agent.  The vagina was irrigated.  All instrument, suture, laparotomy, Ray-Tec, and needle counts were correct x2. The patient tolerated the procedure well and was taken recovery room in stable condition. This is Everitt Amber dictating an operative note on Krista Cross.

## 2015-08-31 NOTE — Discharge Instructions (Signed)
08/31/2015  Return to work: 48 hours  Activity: 1. Be up and out of the bed during the day.  Take a nap if needed.  You may walk up steps but be careful and use the hand rail.  Stair climbing will tire you more than you think, you may need to stop part way and rest.   2. No lifting or straining restrictions  3. No driving for 1 day. Do Not drive if you are taking narcotic pain medicine.  4. Shower daily.    5. No sexual activity and nothing in the vagina for 4 weeks.  Diet: 1. Low sodium Heart Healthy Diet is recommended.  2. It is safe to use a laxative if you have difficulty moving your bowels.   Wound Care: 1. Keep clean and dry.  Shower daily.  Reasons to call the Doctor:   Fever - Oral temperature greater than 100.4 degrees Fahrenheit  Foul-smelling vaginal discharge  Difficulty urinating  Nausea and vomiting  Increased pain in the pelvis that is unrelieved with pain medicine.  Difficulty breathing with or without chest pain  New calf pain especially if only on one side  Sudden, continuing increased vaginal bleeding with or without clots.   Follow-up: 1. See Everitt Amber in 3 weeks.  Contacts: For questions or concerns you should contact:  Dr. Everitt Amber at [phone]  or at [place] Instructions  Activity: Get plenty of rest for the remainder of the day. A responsible adult should stay with you for 24 hours following the procedure.  For the next 24 hours, DO NOT: -Drive a car -Paediatric nurse -Drink alcoholic beverages -Take any medication unless instructed by your physician -Make any legal decisions or sign important papers.  Meals: Start with liquid foods such as gelatin or soup. Progress to regular foods as tolerated. Avoid greasy, spicy, heavy foods. If nausea and/or vomiting occur, drink only clear liquids until the nausea and/or vomiting subsides. Call your physician if vomiting  continues.  Special Instructions/Symptoms: Your throat may feel dry or sore from the anesthesia or the breathing tube placed in your throat during surgery. If this causes discomfort, gargle with warm salt water. The discomfort should disappear within 24 hours.  If you had a scopolamine patch placed behind your ear for the management of post- operative nausea and/or vomiting:  1. The medication in the patch is effective for 72 hours, after which it should be removed.  Wrap patch in a tissue and discard in the trash. Wash hands thoroughly with soap and water. 2. You may remove the patch earlier than 72 hours if you experience unpleasant side effects which may include dry mouth, dizziness or visual disturbances. 3. Avoid touching the patch. Wash your hands with soap and water after contact with the patch.

## 2015-08-31 NOTE — Anesthesia Procedure Notes (Signed)
Procedure Name: LMA Insertion Date/Time: 08/31/2015 8:24 AM Performed by: Justice Rocher Pre-anesthesia Checklist: Patient identified, Emergency Drugs available, Suction available and Patient being monitored Patient Re-evaluated:Patient Re-evaluated prior to inductionOxygen Delivery Method: Circle System Utilized Preoxygenation: Pre-oxygenation with 100% oxygen Intubation Type: IV induction Ventilation: Mask ventilation without difficulty LMA: LMA inserted LMA Size: 4.0 Number of attempts: 1 Airway Equipment and Method: Bite block Placement Confirmation: positive ETCO2 Tube secured with: Tape Dental Injury: Teeth and Oropharynx as per pre-operative assessment

## 2015-09-01 ENCOUNTER — Encounter (HOSPITAL_BASED_OUTPATIENT_CLINIC_OR_DEPARTMENT_OTHER): Payer: Self-pay | Admitting: Gynecologic Oncology

## 2015-09-08 ENCOUNTER — Telehealth: Payer: Self-pay | Admitting: Gynecologic Oncology

## 2015-09-08 NOTE — Telephone Encounter (Signed)
Informed patient of findings of IB1 cervical cancer on CKC. Recommend radical hysterectomy. This is scheduled for 10/17/15 (to allow adequate healing after CKC).  The conversation was conducted with the aid of an interpretor.

## 2015-09-25 ENCOUNTER — Ambulatory Visit: Payer: BLUE CROSS/BLUE SHIELD | Attending: Gynecologic Oncology | Admitting: Gynecologic Oncology

## 2015-09-25 ENCOUNTER — Encounter: Payer: Self-pay | Admitting: Gynecologic Oncology

## 2015-09-25 VITALS — BP 107/57 | HR 84 | Temp 98.4°F | Resp 18 | Ht 59.25 in | Wt 144.6 lb

## 2015-09-25 DIAGNOSIS — C801 Malignant (primary) neoplasm, unspecified: Secondary | ICD-10-CM | POA: Insufficient documentation

## 2015-09-25 NOTE — Progress Notes (Signed)
Follow-up Note: Gyn-Onc  Consult was requested by Dr. Toney Rakes for the evaluation of Krista Cross 45 y.o. 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   CC:  Chief Complaint  Patient presents with  . adenocarcinoma    follow up    Assessment/Plan:  Ms. Krista Cross  is a 45 y.o.  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   I held a discussion with Cariann and her spouse with a translator present.  I reviewed her pathology with them and our recommendation for a type III radical hysterectomy, bilateral salpingectomy, sentinel lymph node mapping (robotic). I discussed risks associated with this procedure including risks of neurogenic bladder, GI or GU fistula formation, ureteral stricture, infection, bleeding, damage to adjacent structures, reoperation.  I discussed that this will result in permanent infertility.  I discussed anticipated postop recovery including overnight hospital stay and discharge to home with a foley catheter.  HPI: Krista Cross is a 45 year old P4 who is seen in consultation at the request of Dr Toney Rakes for villoglandular adenocarcinoma of the endocervix . The patient had an AGUS pap smear in  June, 2016. 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 Colposcopy and directed biopsies as performed on 08/03/15 which revealed a normal appearing cervix. The endometrium was sampled and showed proliferative endometrium with simple hyperplasia (no atypia), and the endocervical biopsy revealed adenocarcinoma with villoglandular endometrioid features.   The patient denies abnromal bleeding, intermenstrual bleeding, abnormal dyscharge or post coital bleeding. She has a history of a pap smear in 2000 that was abnormal after which she had cryotherapy. But states that all pap smears since that time have been normal until June,  2016.  She has a past surgical history for 2 cesarean sections and a tubal ligation. She states that she has completed child bearing.   Interval Hx:  On 08/31/15 she underwent CKC of the cervix with pathology showing an endometrioid type adecnoarcinoma of the endocervix measuring 1.3cm in horizontal extent and 35mm in depth. The endocervical resection margin was positive from 12-6 o'clock. No LVSI was seen. The post-cone ECC was positive for a detached small fragment of adenocarcinoma. It arose in a background of AIS.   Current Meds:  Outpatient Encounter Prescriptions as of 09/25/2015  Medication Sig  . Cholecalciferol (VITAMIN D) 2000 UNITS tablet Take 2,000 Units by mouth daily.  . [DISCONTINUED] docusate sodium (COLACE) 100 MG capsule Take 1 capsule (100 mg total) by mouth 2 (two) times daily.  . [DISCONTINUED] oxyCODONE-acetaminophen (PERCOCET) 5-325 MG tablet Take 1-2 tablets by mouth every 4 (four) hours as needed for severe pain.   No facility-administered encounter medications on file as of 09/25/2015.    Allergy:  Allergies  Allergen Reactions  . Ibuprofen Swelling    Social Hx:   Social History   Social History  . Marital Status: Married    Spouse Name: N/A  . Number of Children: N/A  . Years of Education: N/A   Occupational History  . Not on file.   Social History Main Topics  . Smoking status: Never Smoker   . Smokeless tobacco: Never Used  . Alcohol Use: No  . Drug Use: No  . Sexual Activity: Yes    Birth Control/ Protection: Surgical   Other Topics Concern  . Not on file   Social History Narrative    Past Surgical Hx:  Past Surgical History  Procedure Laterality Date  .  Cesarean section  07-26-2003  &  12-28-2007    Bilateral Tubal Ligation with last one  . Endovenous ablation saphenous vein w/ laser Right 05-05-2014    EVLA RIGHT GREATER SAPHENOUS VEIN  BY TODD EARLY MD  . Cervical conization w/bx N/A 08/31/2015    Procedure: CONIZATION CERVIX  WITH BIOPSY;  Surgeon: Everitt Amber, MD;  Location: [facility];  Service: Gynecology;  Laterality: N/A;    Past Medical Hx:  Past Medical History  Diagnosis Date  . Varicose veins   . Cervical adenocarcinoma ([place])   . History of abnormal cervical Pap smear     w/ cryoablation in 2000    Past Gynecological History:  SVD x2, c/s x 2  Patient's last menstrual period was 08/26/2015 (exact date).  Family Hx:  Family History  Problem Relation Age of Onset  . Hyperlipidemia Mother   . Diabetes Mother   . Varicose Veins Sister     Review of Systems:  Constitutional  Feels well,    ENT Normal appearing ears and nares bilaterally Skin/Breast  No rash, sores, jaundice, itching, dryness Cardiovascular  No chest pain, shortness of breath, or edema  Pulmonary  No cough or wheeze.  Gastro Intestinal  No nausea, vomitting, or diarrhoea. No bright red blood per rectum, no abdominal pain, change in bowel movement, or constipation.  Genito Urinary  No frequency, urgency, dysuria, no abnormal bleeding Musculo Skeletal  No myalgia, arthralgia, joint swelling or pain  Neurologic  No weakness, numbness, change in gait,  Psychology  No depression, anxiety, insomnia.   Vitals:  Blood pressure 107/57, pulse 84, temperature 98.4 F (36.9 C), temperature source Oral, resp. rate 18, height 4' 11.25" (1.505 m), weight 144 lb 9.6 oz (65.59 kg), last menstrual period 08/26/2015, SpO2 100 %.  Physical Exam: WD in NAD Neck  Supple NROM, without any enlargements.  Lymph Node Survey No cervical supraclavicular or inguinal adenopathy Cardiovascular  Pulse normal rate, regularity and rhythm. S1 and S2 normal.  Lungs  Clear to auscultation bilateraly, without wheezes/crackles/rhonchi. Good air movement.  Skin  No rash/lesions/breakdown  Psychiatry  Alert and oriented to person, place, and time  Abdomen  Normoactive bowel sounds, abdomen soft, non-tender and thin without evidence  of hernia. Back No CVA tenderness Genito Urinary  Vulva/vagina: Normal external female genitalia.  No lesions. No discharge or bleeding.  Bladder/urethra:  No lesions or masses, well supported bladder  Vagina: grossly normal  Cervix: Normal appearing, cone site well healed. 3cm cervix, mobile, no parametrial induration or extension, no palpable or visible tumor.  Uterus: Small, mobile, no parametrial involvement or nodularity.  Adnexa: no palpable masses. Rectal  Good tone, no masses no cul de sac nodularity.  Extremities  No bilateral cyanosis, clubbing or edema.   Donaciano Eva, MD  09/25/2015, 3:33 PM

## 2015-09-25 NOTE — Patient Instructions (Signed)
Preparing for your Surgery  Plan for surgery on October 17, 2015 with Dr. Everitt Amber at [facility] will be scheduled for a robotic assisted radical hysterectomy, bilateral salpingectomy (removal of fallopian tubes), and sentinel lymph node biopsy.    Pre-operative Testing -You will receive a phone call from presurgical testing at [facility] to arrange for a pre-operative testing appointment before your surgery.  This appointment normally occurs one to two weeks before your scheduled surgery.   -Bring your insurance card, copy of an advanced directive if applicable, medication list  -At that visit, you will be asked to sign a consent for a possible blood transfusion in case a transfusion becomes necessary during surgery.  The need for a blood transfusion is rare but having consent is a necessary part of your care.     -You should not be taking blood thinners or aspirin at least ten days prior to surgery unless instructed by your surgeon.  Day Before Surgery at [place] will be asked to take in only clear liquids the day before surgery.  Examples of clear liquids include broths, jello, and clear juices.  Avoid carbonated beverages.  You will be advised to have nothing to eat or drink after midnight the evening before.    Your role in recovery Your role is to become active as soon as directed by your doctor, while still giving yourself time to heal.  Rest when you feel tired. You will be asked to do the following in order to speed your recovery:  - Cough and breathe deeply. This helps toclear and expand your lungs and can prevent pneumonia. You may be given a spirometer to practice deep breathing. A staff member will show you how to use the spirometer. - Do mild physical activity. Walking or moving your legs help your circulation and body functions return to normal. A staff member will help you when you try to walk and will provide you with simple exercises.  Do not try to get up or walk alone the first time. - Actively manage your pain. Managing your pain lets you move in comfort. We will ask you to rate your pain on a scale of zero to 10. It is your responsibility to tell your doctor or nurse where and how much you hurt so your pain can be treated.  Special Considerations -If you are diabetic, you may be placed on insulin after surgery to have closer control over your blood sugars to promote healing and recovery.  This does not mean that you will be discharged on insulin.  If applicable, your oral antidiabetics will be resumed when you are tolerating a solid diet.  -Your final pathology results from surgery should be available by the Friday after surgery and the results will be relayed to you when available.  Transfusin de sangre  (Blood Transfusion) Ardelia Mems transfusin de sangre es un procedimiento en el que se administra sangre donada a travs de una va intravenosa. Puede necesitar sangre por una enfermedad, ciruga o lesin. La sangre puede provenir de un donante. Tambin puede ser su propia sangre que haya donado previamente. La sangre que recibe se compone de diferentes tipos de clulas. Puede recibir lo siguiente:   Glbulos rojos. Estos transportan oxgeno y Optician, dispensing perdida.   Plaquetas. Estas controlan el sangrado.   Plasma. Este ayuda a la coagulacin sangunea. Si tiene un trastorno de Proofreader, tambin puede recibir otro tipo de hemoderivados.  ANTES DEL PROCEDIMIENTO  Posiblemente  deba realizarse un anlisis de sangre para determinar qu tipo de [place] tiene y qu clase de sangre aceptar el cuerpo.   Si planifica someterse a [place], puede donar su propia sangre. Esto es en caso de que necesite una transfusin.   Si ha tenido Nurse, mental health a una transfusin en el pasado, es posible que reciba medicamentos para ayudar a Conservation officer, historic buildings. Tome los medicamentos solamente como se lo haya indicado el  mdico.  Le controlarn la temperatura, la presin arterial y el pulso. PROCEDIMIENTO   Le insertarn una va intravenosa en el brazo o la [place].   La bolsa de sangre donada se conectar a la va intravenosa y Pharmacist, community en la vena.   Un mdico le controlar con frecuencia la temperatura, la presin arterial y el pulso durante el procedimiento. Esto se realiza para detectar signos tempranos de una reaccin a la transfusin.  Si tiene signos o sntomas de una reaccin, es posible que se suspenda el procedimiento y Psychiatric nurse administren medicamentos.   Cuando finalice la transfusin, le retirarn la va intravenosa.   Se puede aplicar presin en el lugar donde se coloc la va intravenosa.   Le colocarn una venda (vendaje).  Este procedimiento puede variar segn el mdico y el hospital.  DESPUS DEL PROCEDIMIENTO  Le controlarn con frecuencia la temperatura, la presin arterial y el pulso.   Esta informacin no tiene Marine scientist el consejo del mdico. Asegrese de hacerle al mdico cualquier pregunta que tenga.   Document Released: 09/21/2010 Document Revised: 09/09/2014 Elsevier Interactive Patient Education [employer].

## 2015-10-12 NOTE — Patient Instructions (Addendum)
YOUR PROCEDURE IS SCHEDULED ON :  10/17/15  REPORT TO [facility] MAIN ENTRANCE FOLLOW SIGNS TO EAST ELEVATOR - GO TO 3rd FLOOR CHECK IN AT 3 EAST NURSES STATION (SHORT STAY) AT: 5:30 AM  CALL THIS NUMBER IF YOU HAVE PROBLEMS THE MORNING OF SURGERY [phone]  REMEMBER:ONLY 1 PER PERSON MAY GO TO SHORT STAY WITH YOU TO GET READY THE MORNING OF YOUR SURGERY  DO NOT EAT FOOD OR DRINK LIQUIDS AFTER MIDNIGHT  TAKE THESE MEDICINES THE MORNING OF SURGERY:NONE  CLEAR LIQUIDS ONLY THE DAY BEFORE SURGERY  CLEAR LIQUID DIET  Foods Allowed                                                                     Foods Excluded  Coffee and tea, regular and decaf                             liquids that you cannot  Plain Jell-O in any flavor                                             see through such as: Fruit ices (not with fruit pulp)                                     milk, soups, orange juice  Iced Popsicles                                                        All solid food Cranberry, grape and apple juices Sports drinks like Gatorade Lightly seasoned clear broth or consume Sugar, honey syrup   _____________________________________________________________________    YOU MAY NOT HAVE ANY METAL ON YOUR BODY INCLUDING HAIR PINS AND PIERCING'S. DO NOT WEAR JEWELRY, MAKEUP, LOTIONS, POWDERS OR PERFUMES. DO NOT WEAR NAIL POLISH. DO NOT SHAVE 48 HRS PRIOR TO SURGERY. MEN MAY SHAVE FACE AND NECK.  DO NOT [place]. [facility] IS NOT RESPONSIBLE FOR VALUABLES.  CONTACTS, DENTURES OR PARTIALS MAY NOT BE WORN TO SURGERY. LEAVE SUITCASE IN CAR. CAN BE BROUGHT TO ROOM AFTER SURGERY.  PATIENTS DISCHARGED THE DAY OF SURGERY WILL NOT BE ALLOWED TO DRIVE HOME.  PLEASE READ OVER THE FOLLOWING INSTRUCTION SHEETS _________________________________________________________________________________                                          [place] - PREPARING  FOR SURGERY  Before surgery, you can play an important role.  Because skin is not sterile, your skin needs to be as free of germs as possible.  You can reduce the number of germs on your skin by washing with CHG (chlorahexidine gluconate) soap before surgery.  CHG is an  antiseptic cleaner which kills germs and bonds with the skin to continue killing germs even after washing. Please DO NOT use if you have an allergy to CHG or antibacterial soaps.  If your skin becomes reddened/irritated stop using the CHG and inform your nurse when you arrive at Short Stay. Do not shave (including legs and underarms) for at least 48 hours prior to the first CHG shower.  You may shave your face. Please follow these instructions carefully:   1.  Shower with CHG Soap the night before surgery and the  morning of Surgery.   2.  If you choose to wash your hair, wash your hair first as usual with your  normal  Shampoo.   3.  After you shampoo, rinse your hair and body thoroughly to remove the  shampoo.                                         4.  Use CHG as you would any other liquid soap.  You can apply chg directly  to the skin and wash . Gently wash with scrungie or clean wascloth    5.  Apply the CHG Soap to your body ONLY FROM THE NECK DOWN.   Do not use on open                           Wound or open sores. Avoid contact with eyes, ears mouth and genitals (private parts).                        Genitals (private parts) with your normal soap.              6.  Wash thoroughly, paying special attention to the area where your surgery  will be performed.   7.  Thoroughly rinse your body with warm water from the neck down.   8.  DO NOT shower/wash with your normal soap after using and rinsing off  the CHG Soap .                9.  Pat yourself dry with a clean towel.             10.  Wear clean night clothes to bed after shower             11.  Place clean sheets on your bed the night of your first shower and do  not  sleep with pets.  Day of Surgery : Do not apply any lotions/deodorants the morning of surgery.  Please wear clean clothes to the hospital/surgery center.  FAILURE TO FOLLOW THESE INSTRUCTIONS MAY RESULT IN THE CANCELLATION OF YOUR SURGERY    PATIENT SIGNATURE_________________________________  ______________________________________________________________________     Krista Cross  An incentive spirometer is a tool that can help keep your lungs clear and active. This tool measures how well you are filling your lungs with each breath. Taking long deep breaths may help reverse or decrease the chance of developing breathing (pulmonary) problems (especially infection) following:  A long period of time when you are unable to move or be active. BEFORE THE PROCEDURE   If the spirometer includes an indicator to show your best effort, your nurse or respiratory therapist will set it to a desired goal.  If possible, sit up straight or lean slightly  forward. Try not to slouch.  Hold the incentive spirometer in an upright position. INSTRUCTIONS FOR USE   Sit on the edge of your bed if possible, or sit up as far as you can in bed or on a chair.  Hold the incentive spirometer in an upright position.  Breathe out normally.  Place the mouthpiece in your mouth and seal your lips tightly around it.  Breathe in slowly and as deeply as possible, raising the piston or the ball toward the top of the column.  Hold your breath for 3-5 seconds or for as long as possible. Allow the piston or ball to fall to the bottom of the column.  Remove the mouthpiece from your mouth and breathe out normally.  Rest for a few seconds and repeat Steps 1 through 7 at least 10 times every 1-2 hours when you are awake. Take your time and take a few normal breaths between deep breaths.  The spirometer may include an indicator to show your best effort. Use the indicator as a goal to work toward during each  repetition.  After each set of 10 deep breaths, practice coughing to be sure your lungs are clear. If you have an incision (the cut made at the time of surgery), support your incision when coughing by placing a pillow or rolled up towels firmly against it. Once you are able to get out of bed, walk around indoors and cough well. You may stop using the incentive spirometer when instructed by your caregiver.  RISKS AND COMPLICATIONS  Take your time so you do not get dizzy or light-headed.  If you are in pain, you may need to take or ask for pain medication before doing incentive spirometry. It is harder to take a deep breath if you are having pain. AFTER USE  Rest and breathe slowly and easily.  It can be helpful to keep track of a log of your progress. Your caregiver can provide you with a simple table to help with this. If you are using the spirometer at home, follow these instructions: [place] IF:   You are having difficultly using the spirometer.  You have trouble using the spirometer as often as instructed.  Your pain medication is not giving enough relief while using the spirometer.  You develop fever of 100.5 F (38.1 C) or higher. SEEK IMMEDIATE MEDICAL CARE IF:   You cough up bloody sputum that had not been present before.  You develop fever of 102 F (38.9 C) or greater.  You develop worsening pain at or near the incision site. MAKE SURE YOU:   Understand these instructions.  Will watch your condition.  Will get help right away if you are not doing well or get worse. Document Released: 12/30/2006 Document Revised: 11/11/2011 Document Reviewed: 03/02/2007 ExitCare Patient Information 2014 ExitCare, [place].   ________________________________________________________________________  WHAT IS A BLOOD TRANSFUSION? Blood Transfusion Information  A transfusion is the replacement of blood or some of its parts. Blood is made up of multiple cells which provide  different functions.  Red blood cells carry oxygen and are used for blood loss replacement.  White blood cells fight against infection.  Platelets control bleeding.  Plasma helps clot blood.  Other blood products are available for specialized needs, such as hemophilia or other clotting disorders. BEFORE THE TRANSFUSION  Who gives blood for transfusions?   Healthy volunteers who are fully evaluated to make sure their blood is safe. This is blood bank blood. Transfusion therapy  is the safest it has ever been in the practice of medicine. Before blood is taken from a donor, a complete history is taken to make sure that person has no history of diseases nor engages in risky social behavior (examples are intravenous drug use or sexual activity with multiple partners). The donor's travel history is screened to minimize risk of transmitting infections, such as malaria. The donated blood is tested for signs of infectious diseases, such as HIV and hepatitis. The blood is then tested to be sure it is compatible with you in order to minimize the chance of a transfusion reaction. If you or a relative donates blood, this is often done in anticipation of surgery and is not appropriate for emergency situations. It takes many days to process the donated blood. RISKS AND COMPLICATIONS Although transfusion therapy is very safe and saves many lives, the main dangers of transfusion include:   Getting an infectious disease.  Developing a transfusion reaction. This is an allergic reaction to something in the blood you were given. Every precaution is taken to prevent this. The decision to have a blood transfusion has been considered carefully by your caregiver before blood is given. Blood is not given unless the benefits outweigh the risks. AFTER THE TRANSFUSION  Right after receiving a blood transfusion, you will usually feel much better and more energetic. This is especially true if your red blood cells have  gotten low (anemic). The transfusion raises the level of the red blood cells which carry oxygen, and this usually causes an energy increase.  The nurse administering the transfusion will monitor you carefully for complications. HOME CARE INSTRUCTIONS  No special instructions are needed after a transfusion. You may find your energy is better. Speak with your caregiver about any limitations on activity for underlying diseases you may have. SEEK MEDICAL CARE IF:   Your condition is not improving after your transfusion.  You develop redness or irritation at the intravenous (IV) site. SEEK IMMEDIATE MEDICAL CARE IF:  Any of the following symptoms occur over the next 12 hours:  Shaking chills.  You have a temperature by mouth above 102 F (38.9 C), not controlled by medicine.  Chest, back, or muscle pain.  People around you feel you are not acting correctly or are confused.  Shortness of breath or difficulty breathing.  Dizziness and fainting.  You get a rash or develop hives.  You have a decrease in urine output.  Your urine turns a dark color or changes to pink, red, or brown. Any of the following symptoms occur over the next 10 days:  You have a temperature by mouth above 102 F (38.9 C), not controlled by medicine.  Shortness of breath.  Weakness after normal activity.  The white part of the eye turns yellow (jaundice).  You have a decrease in the amount of urine or are urinating less often.  Your urine turns a dark color or changes to pink, red, or brown. Document Released: 08/16/2000 Document Revised: 11/11/2011 Document Reviewed: 04/04/2008 [facility] Patient Information 2014 [place], [place].  _______________________________________________________________________

## 2015-10-13 ENCOUNTER — Encounter (HOSPITAL_COMMUNITY): Payer: Self-pay

## 2015-10-13 ENCOUNTER — Encounter (HOSPITAL_COMMUNITY)
Admission: RE | Admit: 2015-10-13 | Discharge: 2015-10-13 | Disposition: A | Discharge status: Home / Self Care | Payer: BLUE CROSS/BLUE SHIELD | Source: Ambulatory Visit | Attending: Gynecologic Oncology | Admitting: Gynecologic Oncology

## 2015-10-13 ENCOUNTER — Ambulatory Visit (HOSPITAL_COMMUNITY)
Admission: RE | Admit: 2015-10-13 | Discharge: 2015-10-13 | Disposition: A | Discharge status: Home / Self Care | Payer: BLUE CROSS/BLUE SHIELD | Source: Ambulatory Visit | Attending: Gynecologic Oncology | Admitting: Gynecologic Oncology

## 2015-10-13 DIAGNOSIS — C539 Malignant neoplasm of cervix uteri, unspecified: Secondary | ICD-10-CM

## 2015-10-13 DIAGNOSIS — Z01818 Encounter for other preprocedural examination: Secondary | ICD-10-CM | POA: Insufficient documentation

## 2015-10-13 LAB — COMPREHENSIVE METABOLIC PANEL
ALBUMIN: 4 g/dL (ref 3.5–5.0)
ALK PHOS: 83 U/L (ref 38–126)
ALT: 13 U/L — ABNORMAL LOW (ref 14–54)
ANION GAP: 7 (ref 5–15)
AST: 16 U/L (ref 15–41)
BUN: 15 mg/dL (ref 6–20)
CO2: 25 mmol/L (ref 22–32)
Calcium: 8.9 mg/dL (ref 8.9–10.3)
Chloride: 108 mmol/L (ref 101–111)
Creatinine, Ser: 0.62 mg/dL (ref 0.44–1.00)
GFR calc Af Amer: >60 mL/min (ref >60)
GFR calc non Af Amer: >60 mL/min (ref >60)
GLUCOSE: 96 mg/dL (ref 65–99)
POTASSIUM: 4.5 mmol/L (ref 3.5–5.1)
SODIUM: 140 mmol/L (ref 135–145)
Total Bilirubin: 0.4 mg/dL (ref 0.3–1.2)
Total Protein: 7.7 g/dL (ref 6.5–8.1)

## 2015-10-13 LAB — URINALYSIS, ROUTINE W REFLEX MICROSCOPIC
BILIRUBIN URINE: NEGATIVE
Glucose, UA: NEGATIVE mg/dL
KETONES UR: NEGATIVE mg/dL
NITRITE: NEGATIVE
Protein, ur: NEGATIVE mg/dL
Specific Gravity, Urine: 1.012 (ref 1.005–1.030)
pH: 6 (ref 5.0–8.0)

## 2015-10-13 LAB — URINE MICROSCOPIC-ADD ON

## 2015-10-13 LAB — CBC WITH DIFFERENTIAL/PLATELET
BASOS ABS: 0 10*3/uL (ref 0.0–0.1)
BASOS PCT: 0 %
EOS ABS: 0.1 10*3/uL (ref 0.0–0.7)
Eosinophils Relative: 1 %
HCT: 33.2 % — ABNORMAL LOW (ref 36.0–46.0)
HEMOGLOBIN: 10.2 g/dL — AB (ref 12.0–15.0)
Lymphocytes Relative: 43 %
Lymphs Abs: 2.6 10*3/uL (ref 0.7–4.0)
MCH: 23.7 pg — ABNORMAL LOW (ref 26.0–34.0)
MCHC: 30.7 g/dL (ref 30.0–36.0)
MCV: 77.2 fL — ABNORMAL LOW (ref 78.0–100.0)
MONOS PCT: 9 %
Monocytes Absolute: 0.5 10*3/uL (ref 0.1–1.0)
NEUTROS PCT: 47 %
Neutro Abs: 2.9 10*3/uL (ref 1.7–7.7)
Platelets: 232 10*3/uL (ref 150–400)
RBC: 4.3 MIL/uL (ref 3.87–5.11)
RDW: 15.9 % — AB (ref 11.5–15.5)
WBC: 6.2 10*3/uL (ref 4.0–10.5)

## 2015-10-13 LAB — PREGNANCY, URINE: Preg Test, Ur: NEGATIVE

## 2015-10-13 NOTE — Progress Notes (Signed)
COMMUNICATED WITH PATIENT THRU MOBILE [place] [phone]

## 2015-10-14 LAB — ABO/RH: ABO/RH(D): A POS

## 2015-10-17 ENCOUNTER — Telehealth (HOSPITAL_COMMUNITY): Payer: Self-pay | Admitting: *Deleted

## 2015-10-17 ENCOUNTER — Encounter (HOSPITAL_COMMUNITY)
Admission: RE | Disposition: A | Discharge status: Home / Self Care | Payer: Self-pay | Source: Ambulatory Visit | Attending: Gynecologic Oncology

## 2015-10-17 ENCOUNTER — Ambulatory Visit (HOSPITAL_COMMUNITY): Payer: BLUE CROSS/BLUE SHIELD | Admitting: Anesthesiology

## 2015-10-17 ENCOUNTER — Ambulatory Visit (HOSPITAL_COMMUNITY)
Admission: RE | Admit: 2015-10-17 | Discharge: 2015-10-18 | Disposition: A | Discharge status: Home / Self Care | Payer: BLUE CROSS/BLUE SHIELD | Source: Ambulatory Visit | Attending: Gynecologic Oncology | Admitting: Gynecologic Oncology

## 2015-10-17 ENCOUNTER — Encounter (HOSPITAL_COMMUNITY): Payer: Self-pay | Admitting: *Deleted

## 2015-10-17 DIAGNOSIS — N838 Other noninflammatory disorders of ovary, fallopian tube and broad ligament: Secondary | ICD-10-CM | POA: Diagnosis not present

## 2015-10-17 DIAGNOSIS — N803 Endometriosis of pelvic peritoneum: Secondary | ICD-10-CM | POA: Diagnosis not present

## 2015-10-17 DIAGNOSIS — N809 Endometriosis, unspecified: Secondary | ICD-10-CM | POA: Diagnosis not present

## 2015-10-17 DIAGNOSIS — D069 Carcinoma in situ of cervix, unspecified: Secondary | ICD-10-CM | POA: Diagnosis not present

## 2015-10-17 DIAGNOSIS — N8312 Corpus luteum cyst of left ovary: Secondary | ICD-10-CM | POA: Insufficient documentation

## 2015-10-17 DIAGNOSIS — C539 Malignant neoplasm of cervix uteri, unspecified: Secondary | ICD-10-CM | POA: Diagnosis present

## 2015-10-17 DIAGNOSIS — I8390 Asymptomatic varicose veins of unspecified lower extremity: Secondary | ICD-10-CM | POA: Diagnosis not present

## 2015-10-17 HISTORY — PX: ROBOTIC ASSISTED TOTAL HYSTERECTOMY WITH BILATERAL SALPINGO OOPHERECTOMY: SHX6086

## 2015-10-17 SURGERY — HYSTERECTOMY, TOTAL, ROBOT-ASSISTED, LAPAROSCOPIC, WITH BILATERAL SALPINGO-OOPHORECTOMY
Anesthesia: General | Laterality: Bilateral

## 2015-10-17 MED ORDER — HYDROMORPHONE HCL 1 MG/ML IJ SOLN
INTRAMUSCULAR | Status: AC
  Filled 2015-10-17: qty 1

## 2015-10-17 MED ORDER — HYDROMORPHONE HCL 1 MG/ML IJ SOLN
INTRAMUSCULAR | Status: DC | PRN
  Administered 2015-10-17 (×3): .2 mg via INTRAVENOUS
  Administered 2015-10-17 (×2): .4 mg via INTRAVENOUS

## 2015-10-17 MED ORDER — GABAPENTIN 300 MG PO CAPS
600.0000 mg | ORAL_CAPSULE | Freq: Every day | ORAL | Status: AC
  Administered 2015-10-17: 600 mg via ORAL
  Filled 2015-10-17: qty 2

## 2015-10-17 MED ORDER — DEXAMETHASONE SODIUM PHOSPHATE 10 MG/ML IJ SOLN
INTRAMUSCULAR | Status: DC | PRN
  Administered 2015-10-17: 10 mg via INTRAVENOUS

## 2015-10-17 MED ORDER — DEXAMETHASONE SODIUM PHOSPHATE 10 MG/ML IJ SOLN
INTRAMUSCULAR | Status: AC
  Filled 2015-10-17: qty 1

## 2015-10-17 MED ORDER — ROCURONIUM BROMIDE 100 MG/10ML IV SOLN
INTRAVENOUS | Status: DC | PRN
  Administered 2015-10-17 (×2): 10 mg via INTRAVENOUS
  Administered 2015-10-17: 50 mg via INTRAVENOUS

## 2015-10-17 MED ORDER — KCL IN DEXTROSE-NACL 20-5-0.45 MEQ/L-%-% IV SOLN
INTRAVENOUS | Status: DC
  Administered 2015-10-17: 15:00:00 via INTRAVENOUS
  Filled 2015-10-17 (×2): qty 1000

## 2015-10-17 MED ORDER — ONDANSETRON HCL 4 MG PO TABS: 4.0000 mg | ORAL_TABLET | Freq: Four times a day (QID) | ORAL | Status: DC | PRN

## 2015-10-17 MED ORDER — ESTRADIOL 0.05 MG/24HR TD PTWK
0.0500 mg | MEDICATED_PATCH | TRANSDERMAL | Status: DC
  Administered 2015-10-18: 0.05 mg via TRANSDERMAL
  Filled 2015-10-17: qty 1

## 2015-10-17 MED ORDER — OXYCODONE-ACETAMINOPHEN 5-325 MG PO TABS
1.0000 | ORAL_TABLET | ORAL | Status: DC | PRN
  Administered 2015-10-17: 2 via ORAL
  Filled 2015-10-17: qty 2

## 2015-10-17 MED ORDER — MIDAZOLAM HCL 5 MG/5ML IJ SOLN
INTRAMUSCULAR | Status: DC | PRN
  Administered 2015-10-17: 2 mg via INTRAVENOUS

## 2015-10-17 MED ORDER — CEFAZOLIN SODIUM-DEXTROSE 2-3 GM-% IV SOLR
INTRAVENOUS | Status: AC
  Filled 2015-10-17: qty 50

## 2015-10-17 MED ORDER — OXYCODONE HCL 5 MG/5ML PO SOLN
5.0000 mg | Freq: Once | ORAL | Status: DC | PRN
  Filled 2015-10-17: qty 5

## 2015-10-17 MED ORDER — PROPOFOL 10 MG/ML IV BOLUS
INTRAVENOUS | Status: DC | PRN
  Administered 2015-10-17: 150 mg via INTRAVENOUS

## 2015-10-17 MED ORDER — ONDANSETRON HCL 4 MG/2ML IJ SOLN: 4.0000 mg | Freq: Four times a day (QID) | INTRAMUSCULAR | Status: DC | PRN

## 2015-10-17 MED ORDER — ACETAMINOPHEN 325 MG PO TABS: 325.0000 mg | ORAL_TABLET | ORAL | Status: DC | PRN

## 2015-10-17 MED ORDER — PROPOFOL 10 MG/ML IV BOLUS
INTRAVENOUS | Status: AC
  Filled 2015-10-17: qty 20

## 2015-10-17 MED ORDER — ENOXAPARIN SODIUM 40 MG/0.4ML ~~LOC~~ SOLN
40.0000 mg | SUBCUTANEOUS | Status: DC
  Administered 2015-10-18: 40 mg via SUBCUTANEOUS
  Filled 2015-10-17 (×2): qty 0.4

## 2015-10-17 MED ORDER — GLYCOPYRROLATE 0.2 MG/ML IJ SOLN
INTRAMUSCULAR | Status: DC | PRN
  Administered 2015-10-17: 0.6 mg via INTRAVENOUS

## 2015-10-17 MED ORDER — FENTANYL CITRATE (PF) 250 MCG/5ML IJ SOLN
INTRAMUSCULAR | Status: AC
  Filled 2015-10-17: qty 5

## 2015-10-17 MED ORDER — ONDANSETRON HCL 4 MG/2ML IJ SOLN
INTRAMUSCULAR | Status: DC | PRN
  Administered 2015-10-17: 4 mg via INTRAVENOUS

## 2015-10-17 MED ORDER — HYDROMORPHONE HCL 1 MG/ML IJ SOLN
0.2500 mg | INTRAMUSCULAR | Status: DC | PRN
  Administered 2015-10-17 (×2): 0.5 mg via INTRAVENOUS

## 2015-10-17 MED ORDER — OXYCODONE HCL 5 MG PO TABS: 5.0000 mg | ORAL_TABLET | Freq: Once | ORAL | Status: DC | PRN

## 2015-10-17 MED ORDER — ROCURONIUM BROMIDE 100 MG/10ML IV SOLN
INTRAVENOUS | Status: AC
  Filled 2015-10-17: qty 1

## 2015-10-17 MED ORDER — NEOSTIGMINE METHYLSULFATE 10 MG/10ML IV SOLN
INTRAVENOUS | Status: DC | PRN
  Administered 2015-10-17: 4 mg via INTRAVENOUS

## 2015-10-17 MED ORDER — LACTATED RINGERS IR SOLN
Status: DC | PRN
  Administered 2015-10-17: 1

## 2015-10-17 MED ORDER — LIDOCAINE HCL (CARDIAC) 20 MG/ML IV SOLN
INTRAVENOUS | Status: DC | PRN
  Administered 2015-10-17: 60 mg via INTRATRACHEAL

## 2015-10-17 MED ORDER — ACETAMINOPHEN 160 MG/5ML PO SOLN: 325.0000 mg | ORAL | Status: DC | PRN

## 2015-10-17 MED ORDER — LACTATED RINGERS IV SOLN
INTRAVENOUS | Status: DC | PRN
  Administered 2015-10-17 (×2): via INTRAVENOUS

## 2015-10-17 MED ORDER — CEFAZOLIN SODIUM-DEXTROSE 2-3 GM-% IV SOLR
2.0000 g | INTRAVENOUS | Status: AC
  Administered 2015-10-17: 2 g via INTRAVENOUS

## 2015-10-17 MED ORDER — HYDROMORPHONE HCL 1 MG/ML IJ SOLN: 0.2000 mg | INTRAMUSCULAR | Status: DC | PRN

## 2015-10-17 MED ORDER — ONDANSETRON HCL 4 MG/2ML IJ SOLN
INTRAMUSCULAR | Status: AC
  Filled 2015-10-17: qty 2

## 2015-10-17 MED ORDER — ENOXAPARIN SODIUM 40 MG/0.4ML ~~LOC~~ SOLN
40.0000 mg | SUBCUTANEOUS | Status: AC
  Administered 2015-10-17: 40 mg via SUBCUTANEOUS
  Filled 2015-10-17: qty 0.4

## 2015-10-17 MED ORDER — TRAMADOL HCL 50 MG PO TABS: 100.0000 mg | ORAL_TABLET | Freq: Four times a day (QID) | ORAL | Status: DC | PRN

## 2015-10-17 MED ORDER — HYDROMORPHONE HCL 2 MG/ML IJ SOLN
INTRAMUSCULAR | Status: AC
  Filled 2015-10-17: qty 1

## 2015-10-17 MED ORDER — STERILE WATER FOR INJECTION IJ SOLN
INTRAMUSCULAR | Status: AC
  Filled 2015-10-17: qty 10

## 2015-10-17 MED ORDER — FENTANYL CITRATE (PF) 250 MCG/5ML IJ SOLN
INTRAMUSCULAR | Status: DC | PRN
  Administered 2015-10-17 (×3): 50 ug via INTRAVENOUS
  Administered 2015-10-17: 100 ug via INTRAVENOUS

## 2015-10-17 MED ORDER — MIDAZOLAM HCL 2 MG/2ML IJ SOLN
INTRAMUSCULAR | Status: AC
  Filled 2015-10-17: qty 2

## 2015-10-17 MED ORDER — GLYCOPYRROLATE 0.2 MG/ML IJ SOLN
INTRAMUSCULAR | Status: AC
  Filled 2015-10-17: qty 3

## 2015-10-17 MED ORDER — LIDOCAINE HCL (CARDIAC) 20 MG/ML IV SOLN
INTRAVENOUS | Status: AC
  Filled 2015-10-17: qty 5

## 2015-10-17 SURGICAL SUPPLY — 52 items
APL ESCP 34 STRL LF DISP (HEMOSTASIS)
APPLICATOR SURGIFLO ENDO (HEMOSTASIS) IMPLANT
BAG SPEC RTRVL LRG 6X4 10 (ENDOMECHANICALS)
CHLORAPREP W/TINT 26ML (MISCELLANEOUS) ×2 IMPLANT
COVER SURGICAL LIGHT HANDLE (MISCELLANEOUS) ×2 IMPLANT
COVER TIP SHEARS 8 DVNC (MISCELLANEOUS) ×1 IMPLANT
COVER TIP SHEARS 8MM DA VINCI (MISCELLANEOUS) ×1
DRAPE ARM DVNC X/XI (DISPOSABLE) ×4 IMPLANT
DRAPE COLUMN DVNC XI (DISPOSABLE) ×1 IMPLANT
DRAPE DA VINCI XI ARM (DISPOSABLE) ×4
DRAPE DA VINCI XI COLUMN (DISPOSABLE) ×1
DRAPE SHEET LG 3/4 BI-LAMINATE (DRAPES) ×4 IMPLANT
DRAPE SURG IRRIG POUCH 19X23 (DRAPES) ×2 IMPLANT
ELECT REM PT RETURN 9FT ADLT (ELECTROSURGICAL) ×2
ELECTRODE REM PT RTRN 9FT ADLT (ELECTROSURGICAL) ×1 IMPLANT
GLOVE BIO SURGEON STRL SZ 6 (GLOVE) ×8 IMPLANT
GLOVE BIO SURGEON STRL SZ 6.5 (GLOVE) ×4 IMPLANT
GOWN STRL REUS W/ TWL LRG LVL3 (GOWN DISPOSABLE) ×2 IMPLANT
GOWN STRL REUS W/TWL LRG LVL3 (GOWN DISPOSABLE) ×4
HOLDER FOLEY CATH W/STRAP (MISCELLANEOUS) ×2 IMPLANT
KIT BASIN OR (CUSTOM PROCEDURE TRAY) ×2 IMPLANT
LIQUID BAND (GAUZE/BANDAGES/DRESSINGS) ×2 IMPLANT
MANIPULATOR UTERINE 4.5 ZUMI (MISCELLANEOUS) ×2 IMPLANT
MARKER SKIN DUAL TIP RULER LAB (MISCELLANEOUS) ×2 IMPLANT
OBTURATOR XI 8MM BLADELESS (TROCAR) ×2 IMPLANT
OCCLUDER COLPOPNEUMO (BALLOONS) ×2 IMPLANT
PAD POSITIONING PINK XL (MISCELLANEOUS) ×2 IMPLANT
PORT ACCESS TROCAR AIRSEAL 12 (TROCAR) ×1 IMPLANT
PORT ACCESS TROCAR AIRSEAL 5M (TROCAR) ×1
POUCH ENDO CATCH II 15MM (MISCELLANEOUS) IMPLANT
POUCH SPECIMEN RETRIEVAL 10MM (ENDOMECHANICALS) IMPLANT
SEAL CANN UNIV 5-8 DVNC XI (MISCELLANEOUS) ×4 IMPLANT
SEAL XI 5MM-8MM UNIVERSAL (MISCELLANEOUS) ×4
SET TRI-LUMEN FLTR TB AIRSEAL (TUBING) ×2 IMPLANT
SET TUBE IRRIG SUCTION NO TIP (IRRIGATION / IRRIGATOR) ×2 IMPLANT
SHEET LAVH (DRAPES) ×2 IMPLANT
SOLUTION ELECTROLUBE (MISCELLANEOUS) ×2 IMPLANT
SURGIFLO W/THROMBIN 8M KIT (HEMOSTASIS) IMPLANT
SUT MNCRL AB 4-0 PS2 18 (SUTURE) ×6 IMPLANT
SUT VIC AB 0 CT1 27 (SUTURE) ×4
SUT VIC AB 0 CT1 27XBRD ANTBC (SUTURE) ×1 IMPLANT
SUT VIC AB 3-0 SH 27 (SUTURE) ×4
SUT VIC AB 3-0 SH 27XBRD (SUTURE) IMPLANT
SYR 50ML LL SCALE MARK (SYRINGE) ×2 IMPLANT
TOWEL OR 17X26 10 PK STRL BLUE (TOWEL DISPOSABLE) ×4 IMPLANT
TOWEL OR NON WOVEN STRL DISP B (DISPOSABLE) ×2 IMPLANT
TRAP SPECIMEN MUCOUS 40CC (MISCELLANEOUS) IMPLANT
TRAY FOLEY W/METER SILVER 14FR (SET/KITS/TRAYS/PACK) ×2 IMPLANT
TRAY LAPAROSCOPIC (CUSTOM PROCEDURE TRAY) ×2 IMPLANT
TROCAR BLADELESS OPT 5 100 (ENDOMECHANICALS) ×2 IMPLANT
TROCAR XCEL 12X100 BLDLESS (ENDOMECHANICALS) ×2 IMPLANT
WATER STERILE IRR 1500ML POUR (IV SOLUTION) ×4 IMPLANT

## 2015-10-17 NOTE — Op Note (Signed)
OPERATIVE NOTE 09/22/15  Surgeon: Donaciano Eva   Assistants: Dr Lahoma Crocker (an MD assistant was necessary for tissue manipulation, management of robotic instrumentation, retraction and positioning due to the complexity of the case and hospital policies).   Anesthesia: General endotracheal anesthesia  ASA Class: 2   Pre-operative Diagnosis: stage IB1 adenocarcinoma of the cervix  Post-operative Diagnosis: same and stage II endometriosis  Operation: Robotic-assisted type III radical laparoscopic hysterectomy with bilateral salpingoophorectomy and bilateral pelvic lymphadenectomy  Surgeon: Donaciano Eva  Assistant Surgeon: Lahoma Crocker MD  Anesthesia: GET  Urine Output: 300  Operative Findings:  : [place] endometriosis on the posterior cul de sac. Dense adhesions between the bladder peritoneum and the anterior uterus. No gross visible residual cervical cancer. White nodularity on right fallopian tube.   Estimated Blood Loss:  100cc      Total IV Fluids: 1,000 ml         Specimens: right external iliac SLN 1, right external iliac SLN 2, right obturator SLN, left pelvic lymph nodes, uterus cervix, bilateral tubes and ovaries, upper vagina.         Complications:  None; patient tolerated the procedure well.         Disposition: PACU - hemodynamically stable.  Procedure Details  The patient was seen in the Holding Room. The risks, benefits, complications, treatment options, and expected outcomes were discussed with the patient.  The patient concurred with the proposed plan, giving informed consent.  The site of surgery properly noted/marked. The patient was identified as [employer] and the procedure verified as a Robotic-assisted radical hysterectomy with bilateral salpingo oophorectomy and bilateral pelvic lymphadenectomy. A Time Out was held and the above information confirmed.  After induction of anesthesia, the patient was draped and  prepped in the usual sterile manner. Pt was placed in supine position after anesthesia and draped and prepped in the usual sterile manner. The abdominal drape was placed after the CholoraPrep had been allowed to dry for 3 minutes.  Her arms were tucked to her side with all appropriate precautions.  The chest was secured to the table.  The patient was placed in the semi-lithotomy position in [place].  The perineum was prepped with Betadine.  Foley catheter was placed. 1mg  total of ICG was injected into the cervical stroma at 2 and 9 o'clock at a 65mm depth (concentration 0..5mg /ml).   An EEA sizer (large size) was placed vaginally to define the vaginal fornices.  A second time-out was performed.  OG tube placement was confirmed and to suction.    Procedure:  The patient was brought to the operating room where general anesthesia was administered with no complications.  The patient was placed in the dorsal lithotomy position in padded Allen stirrups.  The arms were tucked at the sides with gel pads protecting the elbows and foam protecting the hands. The patient was then prepped.  A Foley was placed to gravity. An EEA sizer was placed in the vaginal fornices.  The patient was then draped in the normal manner.  Next, a 5 mm skin incision was made 1 cm below the subcostal margin in the midclavicular line.  The 5 mm Optiview port and scope was used for direct entry.  Opening pressure was under 10 mm CO2.  The abdomen was insufflated and the findings were noted as above.   At this point and all points during the procedure, the patient's intra-abdominal pressure did not exceed 15 mmHg. Next, a 10 mm  skin incision was made at the umbilicus and a right and left port was placed about 10 cm lateral to the robot port on the right and left side.  A fourth arm was placed in the left lower quadrant 2 cm above and superior and medial to the anterior superior iliac spine.  All ports were placed under direct visualization.   The patient was placed in steep Trendelenburg.  Bowel was away into the upper abdomen.  The robot was docked in the normal manner.  The right retroperitoneum in the pelvis was opened parallel to the IP ligament. The right paravesical space was developed with monopolar and sharp dissection. It was held open with tension on the median umbilical ligament with the forth arm. The pararectal space was opened with blunt and sharp dissection to mobilize the ureter off of the medial surface of the internal iliac artery. The medial leaf of the broad ligament containing the ureter was held medially (opening the pararectal space) by the assistant's grasper. The right and left peritoneum were opened parallel to the IP ligament to open the retroperitoneal spaces bilaterally. The SLN mapping was performed in bilateral pelvic basins. The NIR imaging was acitvated. Lymphatic channels were identified travelling to the following visualized sentinel lymph node's: right external iiliac 1 and 2, obturator. These SLN's were separated from their surrounding lymphatic tissue, removed and sent for permanent pathology.  The same procedure with the same steps was performed on the patient's left side. No mapping was detected on the left and therefore a full left sided pelvic lymphadenectomy was performed. The left retroperitoneum in the pelvis was opened parallel to the IP ligament. The left paravesical space was developed with monopolar and sharp dissection. It was held open with tension on the median umbilical ligament with the forth arm. The pararectal space was opened with blunt and sharp dissection to mobilize the ureter off of the medial surface of the internal iliac artery. The medial leaf of the broad ligament containing the ureter was held medially (opening the pararectal space) by the assistant's grasper. The left pelvic lymphadenectomy was performed by skeletonizing the internal iliac artery at the bifurcation with the external  iliac artery. The obturator nerve was identified in the base of lateral paravesical space. The ureter was mobilized medially off of the dissection by developing the pararectal space. The genitofemoral nerve was identified, skeletonized and mobilized laterally off of the external iliac artery. An enbloc resection of lymph nodes was performed within the following boundaries: the mid portion of the common iliac proximally, the circumflex iliac vein distally, the obturator nerve posteriorally, the genitofemoral nerve laterally. The nodal basin (including obturator space) were confirmed to be empty of nodes and hemostatic. The nodes were placed in an endocatch bag and retrieved at the end of the procedure vaginally.  The radical hysterectomy was begun by first skeletonizing the right uterine artery at its origin from the internal iliac artery by skeletonizing it 360 degrees and defining the parauterine web between the paravesical and pararectal spaces. The right ureter was skeletonized off of its attachments to the broad ligament and mobilized laterally. Using meticulous blunt and sparing monopolar dissection in short controlled bursts, the right ureter was untunnelled from under the right uterine artery. The anterior vessicouterine ligament was developed and the bladder flap was taken down to below the level of the tumor and below the rounded portion of the EEA sizer. This then definied the anterior bladder pillar. The uterine artery was bipolar fulgarated to seal  it, then transected. The uterine vein was also sealed and resected. The lateral boundary of the parametrium was taken down with biploar and monopolar dissection to the level of the deep vaginal vein. The uterine vessels were retracted superior and medially over the ureter on the right. The ureter was untunnelled through to its entry into the bladder with meticulous sharp dissection and short bursts of monopolar energy. The ureter was dissected off of its  attachments to the anterior vagina. The anterior bladder pillar was skeletonized and sealed with bipolar energy while the ureter was deflected distally. Substantial bleeding was encountered during dissection of the anterior pillar from the cervical branch of the uterine artery. This was made hemostatic with bipolar sealing with caution to deflect the ureter in its tunnel from the bipolar instrument. A 3-0 vicryl suture on the midline bladder was placed to create hemostasis. The posterior bladder pillar was also dissected with monopolar scissors from the upper vagina.  The rectovaginal septum was entered posteriorally with sharp dissection and the rectum was dissected off of the vagina. A window was created in the broad ligament with care to mobilize the ureter laterally. This skeletonized the IP ligament which was sealed with bipolar energy and transected.The right uterosacral ligament was transected with bipolar and monopolar energy 1/2 to 2/3rds of the way towards the uterosacral ligament insertion into the sacrum. In doing so meticulous attention was made to identify and wherever possible, spare the hypogastric nerves. The right paravaginal tissues were tubularized around the vagina on the right at the inferior boundary of the dissection.  The left radical hysterectomy was then performed by first skeletonizing the left uterine artery at its origin from the internal iliac artery by skeletonizing it 360 degrees and defining the parauterine web between the paravesical and pararectal spaces. The left ureter was skeletonized off of its attachments to the broad ligament and mobilized laterally. Using meticulous blunt and sparing monopolar dissection in short controlled bursts, the left ureter was untunnelled from under the left uterine artery. The left anterior vessicouterine ligament was developed and the bladder flap was taken down to below the level of the tumor and below the rounded portion of the EEA sizer.  This then definied the anterior bladder pillar. The uterine artery was bipolar fulgarated to seal it, then transected. The uterine vein was also sealed and resected. The lateral boundary of the parametrium was taken down with biploar and monopolar dissection to the level of the deep vaginal vein. The uterine vessels were retracted superior and medially over the ureter on the right. The ureter was untunnelled through to its entry into the bladder with meticulous sharp dissection and short bursts of monopolar energy. The ureter was dissected off of its attachments to the anterior vagina. The anterior bladder pillar was skeletonized and sealed with bipolar energy while the ureter was deflected distally. The posterior bladder pillar was also dissected with monopolar scissors from the upper vagina.  The rectovaginal septum was entered posteriorally with sharp dissection and the rectum was dissected off of the vagina. A window was created in the broad ligament with care to mobilize the ureter laterally. This skeletonized the IP ligament which was sealed with bipolar energy and transected.The left uterosacral ligament was transected with bipolar and monopolar energy 1/2 to 2/3rds of the way towards the uterosacral ligament insertion into the sacrum. In doing so meticulous attention was made to identify and wherever possible, spare the hypogastric nerves. The left paravaginal tissues were tubularized around the vagina on  the left at the inferior boundary of the dissection.  The colpotomy was made and the uterus, cervix, bilateral ovaries and tubes were amputated and delivered through the vagina.  Pedicles were inspected and excellent hemostasis was achieved.    The colpotomy at the vaginal cuff was closed with two 0-Vicryl on a CT1 needle in a running manner.  Irrigation was used and excellent hemostasis was achieved.  At this point in the procedure was completed.  Robotic instruments were removed under direct  visulaization.  The robot was undocked. The 10 mm ports were closed with Vicryl on a UR-5 needle and the fascia was closed with 0 Vicryl on a UR-5 needle.  The skin was closed with 4-0 Vicryl in a subcuticular manner.  Dermabond was applied.  Sponge, lap and needle counts correct x 2.  The patient was taken to the recovery room in stable condition.  The vagina was swabbed with  minimal bleeding noted.   All instrument and needle counts were correct x  3.   The patient was transferred to the recovery room in a stable condition.  Donaciano Eva, MD

## 2015-10-17 NOTE — Anesthesia Procedure Notes (Signed)
Procedure Name: Intubation Date/Time: 10/17/2015 7:31 AM Performed by: Dione Booze Pre-anesthesia Checklist: Patient identified, Emergency Drugs available, Suction available and Patient being monitored Patient Re-evaluated:Patient Re-evaluated prior to inductionOxygen Delivery Method: Circle system utilized Preoxygenation: Pre-oxygenation with 100% oxygen Intubation Type: IV induction Ventilation: Mask ventilation without difficulty Laryngoscope Size: Mac and 4 Grade View: Grade I Tube type: Oral Number of attempts: 1 Airway Equipment and Method: Stylet Placement Confirmation: ETT inserted through vocal cords under direct vision and positive ETCO2 Secured at: 21 cm Tube secured with: Tape Dental Injury: Teeth and Oropharynx as per pre-operative assessment

## 2015-10-17 NOTE — Anesthesia Preprocedure Evaluation (Signed)
Anesthesia Evaluation  Patient identified by MRN, date of birth, ID band Patient awake    Reviewed: Allergy & Precautions, NPO status , Patient's Chart, lab work & pertinent test results  History of Anesthesia Complications Negative for: history of anesthetic complications  Airway Mallampati: I  TM Distance: >3 FB Neck ROM: Full    Dental  (+) Teeth Intact   Pulmonary neg pulmonary ROS,    breath sounds clear to auscultation       Cardiovascular negative cardio ROS   Rhythm:Regular     Neuro/Psych negative neurological ROS  negative psych ROS   GI/Hepatic negative GI ROS, Neg liver ROS,   Endo/Other  negative endocrine ROS  Renal/GU negative Renal ROS     Musculoskeletal negative musculoskeletal ROS (+)   Abdominal   Peds  Hematology negative hematology ROS (+)   Anesthesia Other Findings Cervical cancer  Reproductive/Obstetrics                             Anesthesia Physical Anesthesia Plan  ASA: II  Anesthesia Plan: General   Post-op Pain Management:    Induction: Intravenous  Airway Management Planned: Oral ETT  Additional Equipment: None  Intra-op Plan:   Post-operative Plan: Extubation in OR  Informed Consent: I have reviewed the patients History and Physical, chart, labs and discussed the procedure including the risks, benefits and alternatives for the proposed anesthesia with the patient or authorized representative who has indicated his/her understanding and acceptance.   Dental advisory given  Plan Discussed with: CRNA and Surgeon  Anesthesia Plan Comments:         Anesthesia Quick Evaluation

## 2015-10-17 NOTE — Transfer of Care (Signed)
Immediate Anesthesia Transfer of Care Note  Patient: Krista Cross  Procedure(s) Performed: Procedure(s): XI ROBOTIC ASSISTED TYPE III RADICAL TOTAL HYSTERECTOMY WITH BILATERAL SALPINGECTOMY OOPHORECTOMY WITH SENTINEL LYMPH NODE DISSECTION (Bilateral)  Patient Location: PACU  Anesthesia Type:General  Level of Consciousness: awake, sedated and responds to stimulation  Airway & Oxygen Therapy: Patient Spontanous Breathing and Patient connected to face mask oxygen  Post-op Assessment: Report given to RN and Post -op Vital signs reviewed and stable  Post vital signs: Reviewed and stable  Last Vitals:  Filed Vitals:   10/17/15 0522  BP: 98/49  Pulse: 78  Temp: 37.1 C  Resp: 18    Complications: No apparent anesthesia complications

## 2015-10-17 NOTE — H&P (View-Only) (Signed)
Follow-up Note: Gyn-Onc  Consult was requested by Dr. Toney Rakes for the evaluation of Krista Cross 45 y.o. 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   CC:  Chief Complaint  Patient presents with  . adenocarcinoma    follow up    Assessment/Plan:  Krista Cross  is a 45 y.o.  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   I held a discussion with Krista Cross and Krista Cross with a translator present.  I reviewed Krista pathology with them and our recommendation for a type III radical hysterectomy, bilateral salpingectomy, sentinel lymph node mapping (robotic). I discussed risks associated with this procedure including risks of neurogenic bladder, GI or GU fistula formation, ureteral stricture, infection, bleeding, damage to adjacent structures, reoperation.  I discussed that this will result in permanent infertility.  I discussed anticipated postop recovery including overnight hospital stay and discharge to home with a foley catheter.  HPI: Krista Cross is a 45 year old P4 who is seen in consultation at the request of Dr Toney Rakes for villoglandular adenocarcinoma of the endocervix . The patient had an AGUS pap smear in  June, 2016. 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 Colposcopy and directed biopsies as performed on 08/03/15 which revealed a normal appearing cervix. The endometrium was sampled and showed proliferative endometrium with simple hyperplasia (no atypia), and the endocervical biopsy revealed adenocarcinoma with villoglandular endometrioid features.   The patient denies abnromal bleeding, intermenstrual bleeding, abnormal dyscharge or post coital bleeding. She has a history of a pap smear in 2000 that was abnormal after which she had cryotherapy. But states that all pap smears since that time have been normal until June,  2016.  She has a past surgical history for 2 cesarean sections and a tubal ligation. She states that she has completed child bearing.   Interval Hx:  On 08/31/15 she underwent CKC of the cervix with pathology showing an endometrioid type adecnoarcinoma of the endocervix measuring 1.3cm in horizontal extent and 59mm in depth. The endocervical resection margin was positive from 12-6 o'clock. No LVSI was seen. The post-cone ECC was positive for a detached small fragment of adenocarcinoma. It arose in a background of AIS.   Current Meds:  Outpatient Encounter Prescriptions as of 09/25/2015  Medication Sig  . Cholecalciferol (VITAMIN D) 2000 UNITS tablet Take 2,000 Units by mouth daily.  . [DISCONTINUED] docusate sodium (COLACE) 100 MG capsule Take 1 capsule (100 mg total) by mouth 2 (two) times daily.  . [DISCONTINUED] oxyCODONE-acetaminophen (PERCOCET) 5-325 MG tablet Take 1-2 tablets by mouth every 4 (four) hours as needed for severe pain.   No facility-administered encounter medications on file as of 09/25/2015.    Allergy:  Allergies  Allergen Reactions  . Ibuprofen Swelling    Social Hx:   Social History   Social History  . Marital Status: Married    Cross Name: N/A  . Number of Children: N/A  . Years of Education: N/A   Occupational History  . Not on file.   Social History Main Topics  . Smoking status: Never Smoker   . Smokeless tobacco: Never Used  . Alcohol Use: No  . Drug Use: No  . Sexual Activity: Yes    Birth Control/ Protection: Surgical   Other Topics Concern  . Not on file   Social History Narrative    Past Surgical Hx:  Past Surgical History  Procedure Laterality Date  .  Cesarean section  07-26-2003  &  12-28-2007    Bilateral Tubal Ligation with last one  . Endovenous ablation saphenous vein w/ laser Right 05-05-2014    EVLA RIGHT GREATER SAPHENOUS VEIN  BY TODD EARLY MD  . Cervical conization w/bx N/A 08/31/2015    Procedure: CONIZATION CERVIX  WITH BIOPSY;  Surgeon: Everitt Amber, MD;  Location: [facility];  Service: Gynecology;  Laterality: N/A;    Past Medical Hx:  Past Medical History  Diagnosis Date  . Varicose veins   . Cervical adenocarcinoma ([place])   . History of abnormal cervical Pap smear     w/ cryoablation in 2000    Past Gynecological History:  SVD x2, c/s x 2  Patient's last menstrual period was 08/26/2015 (exact date).  Family Hx:  Family History  Problem Relation Age of Onset  . Hyperlipidemia Mother   . Diabetes Mother   . Varicose Veins Sister     Review of Systems:  Constitutional  Feels well,    ENT Normal appearing ears and nares bilaterally Skin/Breast  No rash, sores, jaundice, itching, dryness Cardiovascular  No chest pain, shortness of breath, or edema  Pulmonary  No cough or wheeze.  Gastro Intestinal  No nausea, vomitting, or diarrhoea. No bright red blood per rectum, no abdominal pain, change in bowel movement, or constipation.  Genito Urinary  No frequency, urgency, dysuria, no abnormal bleeding Musculo Skeletal  No myalgia, arthralgia, joint swelling or pain  Neurologic  No weakness, numbness, change in gait,  Psychology  No depression, anxiety, insomnia.   Vitals:  Blood pressure 107/57, pulse 84, temperature 98.4 F (36.9 C), temperature source Oral, resp. rate 18, height 4' 11.25" (1.505 m), weight 144 lb 9.6 oz (65.59 kg), last menstrual period 08/26/2015, SpO2 100 %.  Physical Exam: WD in NAD Neck  Supple NROM, without any enlargements.  Lymph Node Survey No cervical supraclavicular or inguinal adenopathy Cardiovascular  Pulse normal rate, regularity and rhythm. S1 and S2 normal.  Lungs  Clear to auscultation bilateraly, without wheezes/crackles/rhonchi. Good air movement.  Skin  No rash/lesions/breakdown  Psychiatry  Alert and oriented to person, place, and time  Abdomen  Normoactive bowel sounds, abdomen soft, non-tender and thin without evidence  of hernia. Back No CVA tenderness Genito Urinary  Vulva/vagina: Normal external female genitalia.  No lesions. No discharge or bleeding.  Bladder/urethra:  No lesions or masses, well supported bladder  Vagina: grossly normal  Cervix: Normal appearing, cone site well healed. 3cm cervix, mobile, no parametrial induration or extension, no palpable or visible tumor.  Uterus: Small, mobile, no parametrial involvement or nodularity.  Adnexa: no palpable masses. Rectal  Good tone, no masses no cul de sac nodularity.  Extremities  No bilateral cyanosis, clubbing or edema.   Donaciano Eva, MD  09/25/2015, 3:33 PM

## 2015-10-17 NOTE — Interval H&P Note (Signed)
History and Physical Interval Note:  10/17/2015 7:13 AM  Krista Cross  has presented today for surgery, with the diagnosis of cervical cancer  The various methods of treatment have been discussed with the patient and family. After consideration of risks, benefits and other options for treatment, the patient has consented to  Procedure(s): XI ROBOTIC ASSISTED TYPE III RADICAL TOTAL HYSTERECTOMY WITH BILATERAL SALPINGECTOMY WITH SENTAL LYMPHNODE BIOPSY (Bilateral) as a surgical intervention .  The patient's history has been reviewed, patient examined, no change in status, stable for surgery.  I have reviewed the patient's chart and labs.  Questions were answered to the patient's satisfaction.     Donaciano Eva

## 2015-10-18 DIAGNOSIS — D069 Carcinoma in situ of cervix, unspecified: Secondary | ICD-10-CM | POA: Diagnosis not present

## 2015-10-18 LAB — BASIC METABOLIC PANEL
Anion gap: 7 (ref 5–15)
BUN: 7 mg/dL (ref 6–20)
CALCIUM: 8.2 mg/dL — AB (ref 8.9–10.3)
CHLORIDE: 109 mmol/L (ref 101–111)
CO2: 24 mmol/L (ref 22–32)
CREATININE: 0.66 mg/dL (ref 0.44–1.00)
GFR calc non Af Amer: >60 mL/min (ref >60)
Glucose, Bld: 144 mg/dL — ABNORMAL HIGH (ref 65–99)
Potassium: 4.3 mmol/L (ref 3.5–5.1)
SODIUM: 140 mmol/L (ref 135–145)

## 2015-10-18 LAB — CBC
HCT: 29 % — ABNORMAL LOW (ref 36.0–46.0)
HEMOGLOBIN: 8.8 g/dL — AB (ref 12.0–15.0)
MCH: 24.1 pg — AB (ref 26.0–34.0)
MCHC: 30.3 g/dL (ref 30.0–36.0)
MCV: 79.5 fL (ref 78.0–100.0)
Platelets: 217 10*3/uL (ref 150–400)
RBC: 3.65 MIL/uL — ABNORMAL LOW (ref 3.87–5.11)
RDW: 16.3 % — AB (ref 11.5–15.5)
WBC: 12.9 10*3/uL — ABNORMAL HIGH (ref 4.0–10.5)

## 2015-10-18 MED ORDER — OXYCODONE-ACETAMINOPHEN 5-325 MG PO TABS: 1.0000 | ORAL_TABLET | ORAL | Status: DC | PRN

## 2015-10-18 NOTE — Discharge Instructions (Signed)
10/18/2015  Return to work: 4-6 weeks if applicable  Activity: 1. Be up and out of the bed during the day.  Take a nap if needed.  You may walk up steps but be careful and use the hand rail.  Stair climbing will tire you more than you think, you may need to stop part way and rest.   2. No lifting or straining for 6 weeks.  3. No driving for 1 week(s).  Do not drive if you are taking narcotic pain medicine.  4. Shower daily.  Use soap and water on your incision and pat dry; don't rub.  No tub baths until cleared by your surgeon.   5. No sexual activity and nothing in the vagina for 6 weeks.  6. You may experience a small amount of clear drainage from your incisions, which is normal.  If the drainage persists or increases, please call the office.   Diet: 1. Low sodium Heart Healthy Diet is recommended.  2. It is safe to use a laxative, such as Miralax or Colace, if you have difficulty moving your bowels.   Wound Care: 1. Keep clean and dry.  Shower daily.  Reasons to call the Doctor:  Fever - Oral temperature greater than 100.4 degrees Fahrenheit  Foul-smelling vaginal discharge  Difficulty urinating  Nausea and vomiting  Increased pain at the site of the incision that is unrelieved with pain medicine.  Difficulty breathing with or without chest pain  New calf pain especially if only on one side  Sudden, continuing increased vaginal bleeding with or without clots.   Contacts: For questions or concerns you should contact:  Dr. Everitt Amber at [phone]  Joylene John, NP at [phone]  After Hours: call [phone] and have the GYN Oncologist paged/contacted  Veyo (Foley Catheter Care, Adult) Un catter Foley es un tubo delgado que se coloca en la vejiga para drenar la orina. Se colocar si:   Pierde orina y no puede controlar cundo orina (incontinencia urinaria).  No puede orinar cuando lo necesita (retencin).  Fue  sometido a [place] de prstata o en los genitales.  Sufre alguna afeccin como la esclerosis mltiple, demencia o lesin de la mdula espinal. Si le dan el alta con un catter de Foley colocado, siga las siguientes instrucciones.  CUIDADOS DEL CATTER 1. Lave sus manos con agua y [place]. 2. Use un jabn suave y agua tibia en un pao limpio.  Limpie la zona que rodea el sitio de insercin del catter mediante un movimiento circular, alejndose de la sonda. Nunca limpie hacia el catter ya que este barrido puede hacer ingresar bacterias en la uretra y causar infeccin.  Retire todos los restos de [place]. Seque bien el rea con una toalla limpia dando golpecitos. En el caso de los hombres, lleve el prepucio a Chief of Staff. 3. Adjunte el catter a la pierna de modo que no haya tensin en el catter. Use cinta adhesiva o una correa para unirlo a la pierna. Si [place] una cinta [place], elimine el residuo pegajoso que haya dejado la cinta anterior. 4. Mantenga la bolsa de drenaje debajo del nivel de la vejiga, pero lejos del suelo. [place] para asegurarse de que el catter est funcionando y la orina drene libremente. Asegrese de que el tubo no se retuerza. 6. No tire del catter ni trate de quitarlo. El tironeo puede daar los tejidos internos. CUIDADOS DE LA BOLSA DE DRENAJE Le darn dos bolsas  de drenaje para que lleve a su casa. Ardelia Mems de ellas es una bolsa grande de drenaje para usar durante la noche y la otra es una bolsa de pierna de menor tamao, que se ajusta debajo de la ropa. Usted puede usar la bolsa de la noche en cualquier momento, pero nunca debe usar la bolsa ms pequea durante la noche. Siga las siguientes instrucciones para saber cmo vaciar, cambiar y limpiar sus bolsas de drenaje.  Vaciado de la bolsa de drenaje Debe vaciar la bolsa de drenaje cuando est a  -  completa o al menos 2 a 3 veces al [employer].  1. Lave sus manos con agua y [place]. 2. Mantenga la bolsa de drenaje  bien por debajo del nivel de la vejiga. Esto impide que la orina vuelva al tubo y a la vejiga. 3. Sostenga la bolsa en el inodoro u otro recipiente. 4. Sherlon Handing pico vertedor de la parte inferior de la bolsa y vace la orina en el inodoro o un recipiente. No deje que el pico vertedor toque el inodoro, el contenedor o cualquier otra superficie. Si lo hace, las bacterias pueden ingresar a la bolsa y causar una infeccin. 5. Limpie la boca de vertidor con una gasa o algodn con alcohol. 6. Cierre Chartered certified accountant. 7. Ardelia Mems la bolsa a la pierna con cinta adhesiva o con una correa para la pierna. 8. Lvese bien las manos. Cambio de la bolsa de drenaje Cambie la bolsa de drenaje una vez al mes o antes si empieza a oler mal o la ve sucia. A continuacin ver los pasos a seguir cuando deba cambiar la bolsa de drenaje.  1. Lave sus manos con agua y [place]. 2. Comprima el catter de goma para que la orina no se derrame. 3. Desconecte el tubo del catter del tubo de drenaje en la conexin de la vlvula. No deje que los tubos toquen ninguna superficie. 4. Limpie el extremo del catter con un hisopo con alcohol. Use un hisopo con alcohol diferente para limpiar el extremo del tubo de drenaje. 5. Conecte el tubo del catter al tubo de drenaje de la bolsa limpia. 6. Adhiera la nueva bolsa a la pierna con cinta adhesiva o un broche. Evite ajustar mucho la nueva bolsa. 7. Lvese bien las manos. Limpieza de la bolsa de drenaje 1. Lave sus manos con agua y [place]. 2. Stacy Gardner la bolsa con agua tibia jabonosa. 3. Enjuguela cuidadosamente con agua tibia. 4. Llene la bolsa con una solucin de vinagre blanco y agua (1 taza de vinagre y 950 cc de agua tibia [200 cc vinagre en 1 L de agua tibia). Cierre la bolsa y djela en remojo durante 30 minutos con la solucin. 5. Enjuague la bolsa con agua caliente. 6. Cuelgue la bolsa para que se seque con el pico vertedor abierta y [employer]. 7. Guarde la bolsa limpia (una vez  seca) y Nicoletta Ba de plstico limpia. 8. Lvese bien las manos. PREVENCIN DE INFECCIONES   Lvese las manos antes y despus de Chiropractor.  Tome duchas diariamente y lave el rea en la que el catter ingresa a su cuerpo. No tome baos. Reemplace las correas mojadas por correas secas, si corresponde.  No use talcos, aerosoles o lociones en el rea genital. Slo use cremas, lociones o ungentos como le indique su mdico.  Las mujeres deben higienizarse de adelante hacia atrs despus de mover el intestino.  Beba suficiente lquido para Consulting civil engineer orina clara o de color  amarillo plido, excepto si tiene restriccin de lquidos.  No deje que la bolsa de drenaje ni los tubos toquen el suelo.  Use ropa interior de algodn para absorber la humedad y para mantener su piel ms seca. SOLICITE ATENCIN MDICA SI:   La orina es turbia o huele mal.  El catter se obstruye.  No drena orina hacia la bolsa o siente la vejiga llena.  El catter comienza a perder. SOLICITE ATENCIN MDICA DE INMEDIATO SI:   Tiene dolor, enrojecimiento inflamacin u observa pus en el sitio de ingreso del catter.  Siente dolor en el abdomen, las piernas, la cintura o la vejiga.  Tiene fiebre.  Observa sangre en el catter o la orina es de color rosado o roja.  Tiene nuseas, vmitos o escalofros.  El Proofreader. ASEGRESE DE QUE:   Comprende estas instrucciones.  Controlar su enfermedad.  Solicitar ayuda de inmediato si no mejora o si empeora.   Esta informacin no tiene Marine scientist el consejo del mdico. Asegrese de hacerle al mdico cualquier pregunta que tenga.   Document Released: 08/19/2005 Document Revised: 09/09/2014 Elsevier Interactive Patient Education 2016 [employer].  Acetaminophen; Oxycodone tablets Qu es este medicamento? ACETAMINOFENO; OXICODONA es un analgsico. Se utiliza para tratar los dolores moderados a severos. Este medicamento puede ser  utilizado para otros usos; si tiene alguna pregunta consulte con su proveedor de atencin mdica o con su farmacutico. Qu le debo informar a mi profesional de la salud antes de tomar este medicamento? Necesita saber si usted presenta alguno de los [employer] o situaciones: -tumor cerebral -enfermedad de Crohn, enfermedad intestinal inflamatoria o colitis ulcerativa -abuso de drogas o drogadiccin -lesin de la cabeza -problemas cardiacos o circulatorios -si consume alcohol con frecuencia -enfermedad renal o problemas al orinar -enfermedad heptica -enfermedad pulmonar, asma o dificultades al respirar -una reaccin alrgica o inusual al acetaminofeno, a la oxicodona, a otros analgsicos opiceos, a otros medicamentos, alimentos, colorantes o conservantes -si est embarazada o buscando quedar embarazada -si est amamantando a un beb Cmo debo utilizar este medicamento? Tome este medicamento por va oral con un vaso lleno de agua. Siga las instrucciones de la etiqueta del [place]. Usted puede tomar Coca[employer]Cola con o sin alimentos. Si le produce malestar estomacal, tmelo con alimentos. Tome su medicamento a intervalos regulares. No tome su medicamento con una frecuencia mayor que la indicada. Hable con su pediatra para informarse acerca del uso de este medicamento en nios. Puede requerir Sales executive. Los pacientes de ms de 65 aos de edad pueden presentar reacciones ms fuertes a Fish farm manager y Designer, industrial/product dosis menores. Sobredosis: Pngase en contacto inmediatamente con un centro toxicolgico o una sala de urgencia si usted cree que haya tomado demasiado medicamento. ATENCIN: [employer] es solo para usted. No comparta este medicamento con nadie. Qu sucede si me olvido de una dosis? Si olvida una dosis, tmela lo antes posible. Si es casi la hora de la prxima dosis, tome slo esa dosis. No tome dosis adicionales o dobles. Qu puede interactuar con este  medicamento? -alcohol -antihistamnicos -barbitricos tales como el amobarbital, butalbital, butabarbital, metohexital, pentobarbital, fenobarbital, tiopental y secobarbital -benztropina -medicamentos para problemas de vejiga, tales como solifenacina, trospium, oxibutinina, tolterodina, hiosciamina y metscopolamina -medicamentos para problemas respiratorios, tales como ipratropio y tiotropio -medicamentos para ciertos problemas estomacales o intestinales, tales como propantelina, homatropina metilbromuro, glucopirrolato, atropina, belladona y diciclomina -anestsicos generales, tales como etomidato, [place], xido nitroso, propofol, desflurano, enflurano, halotano, isoflurano y sevoflurano -medicamentos para la depresin,  ansiedad o trastornos psicticos -medicamentos para dormir -relajantes musculares -naltrexona -medicamentos narcticos (opiceos) para Conservation officer, historic buildings -fenotiazinas, tales como perfenacina, tioridazina, clorpromacina, mesoridazina, flufenazina, proclorperazina, promazina y trifluoperazina -escopolamina -tramadol -trihexifenidilo Puede ser que esta lista no menciona todas las posibles interacciones. Informe a su profesional de KB [employer]	[place] de [employer] productos a base de hierbas, medicamentos de [place] o suplementos nutritivos que est tomando. Si usted fuma, consume bebidas alcohlicas o si utiliza drogas ilegales, indqueselo tambin a su profesional de KB Home	[place]. Algunas sustancias pueden interactuar con su medicamento. A qu debo estar atento al usar Coca-Cola? Si el dolor no desaparece, si empeora o si experimenta un dolor nuevo o de tipo diferente, consulte a su mdico o a su profesional de KB Home	[place]. Usted puede desarrollar tolerancia al medicamento. La tolerancia significa que necesitar una dosis ms alta para Best boy. Tolerancia es normal y esperada cuando est tomando este medicamento por un largo perodo de [place]. No suspenda el uso de su medicamento  repentinamente debido a que puede Engineer, materials reaccin severa. Su cuerpo se acostumbra a Fish farm manager. Esto NO significa que sea adicto. La adiccin es un comportamiento que hace referencia a la obtencin y utilizacin de un medicamento con fines que no son mdicos. Si tiene Social research officer, government, existe una razn mdica para que usted tome un analgsico. Su mdico le indicar la cantidad de medicamento que Tree surgeon. Si su mdico desea que [employer], la dosis ser reducida gradualmente para Research officer, political party secundarios. Puede experimentar somnolencia o mareos. No conduzca ni utilice maquinaria ni haga nada que Associate Professor en estado de alerta hasta que sepa cmo le afecta este medicamento. No se siente ni se ponga de pie con rapidez, especialmente si es un paciente de edad avanzada. Esto reduce el riesgo de mareos o [employer]. El alcohol puede interferir con el efecto de este medicamento. Evite consumir bebidas alcohlicas. Hay distintos tipos de medicamentos narcticos (opiceos) para Conservation officer, historic buildings. Si usted toma ms que un tipo a la [employer], podr tener ms [employer] Corporation. Dar a su proveedor de atencin medica una lista de todos los medicamentos que usted [place]. Su mdico le informar la cantidad de medicamento que Aeronautical engineer. No tome ms medicamento que lo indicado. Comunquese con emergencia para ayuda si tiene problemas para respirar. Este medicamento causar estreimiento. Trate de evacuar los intestinos al menos cada 2  3 das. Si no evacua los intestinos durante 3 das, comunquese con su mdico o con su profesional de KB Home	[place]. No tome Tylenol (acetaminofeno) u otros medicamentos que contienen acetaminofeno con este medicamento. Tomando mucho acetaminofeno puede ser muy peligroso. Muchos medicamentos de venta libre contienen acetaminofeno. Lea siempre las etiquetas cuidadosamente para evitar el tomar ms acetaminofeno. Qu efectos secundarios puedo tener al [employer] este  medicamento? Efectos secundarios que debe informar a su mdico o a Barrister's clerk de la salud tan pronto como sea posible: -Scientist, clinical (histocompatibility and immunogenetics), tales como erupcin cutnea, picazn o urticarias, hinchazn de la cara, labios o lengua -dificultades respiratorias, sibilancias -confusin -sensacin de desmayos o aturdimiento -dolor de estmago severo -cansancio o debilidad inusual -color amarillento de los ojos o la piel Efectos secundarios que, por lo general, no requieren atencin mdica (debe informarlos a su mdico o a su profesional de la salud si persisten o si son molestos): -mareos -somnolencia -nuseas -vmitos Puede ser que esta lista no menciona todos los posibles efectos secundarios. Comunquese a su mdico por asesoramiento mdico [employer]. Usted [employer]  efectos secundarios a la FDA por telfono al 1-800-FDA-1088. Dnde debo guardar mi medicina? Mantngala fuera del alcance de los nios. Este medicamento puede ser abusado. Mantenga su medicamento en un lugar seguro para protegerlo contra robos. No comparta este medicamento con nadie. Es peligroso vender o ceder este medicamento y est prohibido por la ley. Gurdelo a [employer], entre 20 y 52 grados C (49 y 71 grados F). Mantenga el envase bien cerrado. Protjalo de Naval architect. Este medicamento puede causar muerte y sobredosis accidental si es tomado por otros adultos, nios o Copy. Tire los medicamentos que no haya utilizado al inodoro para reducir la posibilidad de dao. No use el medicamento despus de la fecha de vencimiento. ATENCIN: Este folleto es un resumen. Puede ser que no cubra toda la posible informacin. Si usted tiene preguntas acerca de esta medicina, consulte con su mdico, su farmacutico o su profesional de Technical sales engineer.    2016, Elsevier/Gold Standard. (2014-10-11 00:00:00)

## 2015-10-18 NOTE — Care Management Note (Signed)
Case Management Note  Patient Details  Name: Krista Cross MRN: [id number] Date of Birth: 07-Jul-1971  Subjective/Objective:     Robotic-assisted type III radical laparoscopic hysterectomy with bilateral salpingoophorectomy and bilateral pelvic lymphadenectomy               Action/Plan: Discharge planning, no HH needs identified  Expected Discharge Date:                  Expected Discharge Plan:  Home/Self Care  In-House Referral:  NA  Discharge planning Services  CM Consult  Post Acute Care Choice:  NA Choice offered to:  NA  DME Arranged:  N/A DME Agency:  NA  HH Arranged:  NA HH Agency:  NA  Status of Service:  Completed, signed off  Medicare Important Message Given:    Date Medicare IM Given:    Medicare IM give by:    Date Additional Medicare IM Given:    Additional Medicare Important Message give by:     If discussed at [place] of Stay Meetings, dates discussed:    Additional Comments:  Guadalupe Maple, RN 10/18/2015, 10:27 AM [phone]

## 2015-10-18 NOTE — Discharge Summary (Signed)
Physician Discharge Summary  Patient ID: Krista Cross MRN: [id number] DOB/AGE: [birth year] 45 y.o.  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    Hospital Course: On 10/17/2015, the patient underwent the following: Procedure(s): XI ROBOTIC ASSISTED TYPE III RADICAL TOTAL HYSTERECTOMY WITH BILATERAL SALPINGECTOMY OOPHORECTOMY WITH SENTINEL LYMPH NODE BIOPSY.  The postoperative course was uneventful.  She was discharged to home on postoperative day 1 tolerating a regular diet, minimal pain, with her foley to be removed in one week in the office.  Consults: None  Significant Diagnostic Studies: None  Treatments: surgery: see above  Discharge Exam: Blood pressure 99/46, pulse 85, temperature 98.5 F (36.9 C), temperature source Oral, resp. rate 16, height 4' 11.5" (1.511 m), weight 144 lb 8 oz (65.545 kg), last menstrual period 09/25/2015, SpO2 100 %. General appearance: alert, cooperative and no distress Resp: clear to auscultation bilaterally Cardio: regular rate and rhythm, S1, S2 normal, no murmur, click, rub or gallop GI: soft, non-tender; bowel sounds normal; no masses,  no organomegaly Extremities: extremities normal, atraumatic, no cyanosis or edema Incision/Wound: lap sites to the abdomen with dermabond without erythema or drainage Foley with clear, yellow urine to drainage  Disposition: 01-Home or Self Care  Discharge Instructions    Call MD for:  difficulty breathing, headache or visual disturbances    Complete by:  As directed      Call MD for:  extreme fatigue    Complete by:  As directed      Call MD for:  hives    Complete by:  As directed      Call MD for:  persistant dizziness or  light-headedness    Complete by:  As directed      Call MD for:  persistant nausea and vomiting    Complete by:  As directed      Call MD for:  redness, tenderness, or signs of infection (pain, swelling, redness, odor or green/yellow discharge around incision site)    Complete by:  As directed      Call MD for:  severe uncontrolled pain    Complete by:  As directed      Call MD for:  temperature >100.4    Complete by:  As directed      Diet - low sodium heart healthy    Complete by:  As directed      Driving Restrictions    Complete by:  As directed   No driving for 1 week.  Do not take narcotics and drive.     Increase activity slowly    Complete by:  As directed      Lifting restrictions    Complete by:  As directed   No lifting greater than 10 lbs.     Sexual Activity Restrictions    Complete by:  As directed   No sexual activity, nothing in the vagina, for 6 weeks.            Medication List    TAKE these medications        oxyCODONE-acetaminophen 5-325 MG tablet  Commonly known as:  PERCOCET/ROXICET  Take 1-2 tablets by mouth every 4 (four) hours as needed (moderate to severe pain).     Vitamin D 2000 units  tablet  Take 2,000 Units by mouth daily.           Follow-up Information    Follow up with Krista Eva, MD On 11/06/2015.   Specialty:  Obstetrics and Gynecology   Why:  at 2 pm at the [facility] information:   [place] [address] [phone]       Follow up with CROSS, [name], NP On 10/24/2015.   Specialty:  Gynecologic Oncology   Why:  at 10:45am at the [facility] for foley catheter removal   Contact information:   [address]  [place] [address] [phone]       Greater than thirty minutes were spend for face to face discharge instructions and discharge orders/summary in EPIC.   Signed: CROSS, Krista Cross 10/18/2015, 10:25 AM

## 2015-10-18 NOTE — Progress Notes (Signed)
Discharge instructions given to patient along with prescription with the help of interpreter.  Patient and husband demonstrated proper foley care and how to empty foley

## 2015-10-20 LAB — TYPE AND SCREEN
ABO/RH(D): A POS
ANTIBODY SCREEN: NEGATIVE

## 2015-10-20 NOTE — Anesthesia Postprocedure Evaluation (Signed)
Anesthesia Post Note  Patient: Krista Cross  Procedure(s) Performed: Procedure(s) (LRB): XI ROBOTIC ASSISTED TYPE III RADICAL TOTAL HYSTERECTOMY WITH BILATERAL SALPINGECTOMY OOPHORECTOMY WITH SENTINEL LYMPH NODE BIOPSY (Bilateral)  Patient location during evaluation: PACU Anesthesia Type: General Level of consciousness: awake Pain management: pain level controlled Vital Signs Assessment: post-procedure vital signs reviewed and stable Respiratory status: spontaneous breathing Cardiovascular status: stable Postop Assessment: no signs of nausea or vomiting Anesthetic complications: no    Last Vitals:  Filed Vitals:   10/18/15 0204 10/18/15 0959  BP: 96/56 99/46  Pulse: 81 85  Temp: 37.2 C 36.9 C  Resp: 16 16    Last Pain:  Filed Vitals:   10/18/15 0959  PainSc: 3                  Henchy Mccauley

## 2015-10-24 ENCOUNTER — Ambulatory Visit: Payer: BLUE CROSS/BLUE SHIELD | Attending: Gynecologic Oncology | Admitting: Gynecologic Oncology

## 2015-10-24 ENCOUNTER — Encounter: Payer: Self-pay | Admitting: Gynecologic Oncology

## 2015-10-24 VITALS — BP 117/48 | HR 86 | Temp 98.1°F | Resp 18 | Wt 144.6 lb

## 2015-10-24 DIAGNOSIS — C539 Malignant neoplasm of cervix uteri, unspecified: Secondary | ICD-10-CM

## 2015-10-24 DIAGNOSIS — R0602 Shortness of breath: Secondary | ICD-10-CM | POA: Insufficient documentation

## 2015-10-24 DIAGNOSIS — I8390 Asymptomatic varicose veins of unspecified lower extremity: Secondary | ICD-10-CM | POA: Diagnosis not present

## 2015-10-24 DIAGNOSIS — Z08 Encounter for follow-up examination after completed treatment for malignant neoplasm: Secondary | ICD-10-CM | POA: Insufficient documentation

## 2015-10-24 DIAGNOSIS — C530 Malignant neoplasm of endocervix: Secondary | ICD-10-CM | POA: Diagnosis present

## 2015-10-24 DIAGNOSIS — Z833 Family history of diabetes mellitus: Secondary | ICD-10-CM | POA: Insufficient documentation

## 2015-10-24 DIAGNOSIS — Z9889 Other specified postprocedural states: Secondary | ICD-10-CM | POA: Diagnosis present

## 2015-10-24 DIAGNOSIS — Z8249 Family history of ischemic heart disease and other diseases of the circulatory system: Secondary | ICD-10-CM | POA: Diagnosis not present

## 2015-10-24 DIAGNOSIS — Z90710 Acquired absence of both cervix and uterus: Secondary | ICD-10-CM | POA: Insufficient documentation

## 2015-10-24 DIAGNOSIS — N958 Other specified menopausal and perimenopausal disorders: Secondary | ICD-10-CM

## 2015-10-24 DIAGNOSIS — E8940 Asymptomatic postprocedural ovarian failure: Secondary | ICD-10-CM

## 2015-10-24 MED ORDER — ESTRADIOL 0.05 MG/24HR TD PTWK: 0.0500 mg | MEDICATED_PATCH | TRANSDERMAL | Status: DC

## 2015-10-24 NOTE — Patient Instructions (Signed)
Doing well.  Continue to monitor your urine output and call for decreased urine amount, increased abdominal pain or swelling, or for any new symptoms.  Plan to follow up as scheduled with Dr. Denman George or sooner if needed.

## 2015-10-24 NOTE — Progress Notes (Signed)
Follow Up Note: Gyn-Onc  Krista Cross 45 y.o. female  CC:  Chief Complaint  Patient presents with  . Routine Post Op    follow up foley removal    HPI: Krista Cross is a 45 year old female initially seen in consultation at the request of Dr Toney Rakes for villoglandular adenocarcinoma of the endocervix . The patient had an AGUS pap smear in June, 2016 with high risk HPV detected. Colposcopy and directed biopsies were performed on 08/03/15 which revealed a normal appearing cervix. The endometrium was sampled and showed proliferative endometrium with simple hyperplasia (no atypia), and the endocervical biopsy revealed adenocarcinoma with villoglandular endometrioid features.   On 08/31/15, she underwent a cold knife conization of the cervix.  Final pathology revealed:1. Cervix, cone ADENOCARCINOMA IN SITU, ENDOMETRIOID TYPE WITH SUPERFICIAL INVASION THE CARCINOMA IS1.3 CM IN LENGTH, 0.6 CM IN THICKNESS.  THE ENDOCERVICAL RESECTION MARGIN IS POSITIVE FROM 12 TO 6 O'CLOCK.  NO LYMPHOVASCULAR INVASION IDENTIFIED 2. Endocervix, curettage, post cone:  DETACHED SMALL FRAGMENT OF ADENOCARCINOMA   Based on the cone findings, it was recommended she proceed with a radical hysterectomy.  On 10/17/15, she underwent a Robotic-assisted type III radical laparoscopic hysterectomy with bilateral salpingoophorectomy and bilateral pelvic lymphadenectomy.  Final pathology revealed:  Diagnosis 1. Lymph node, sentinel, biopsy, right external iliac - ONE BENIGN LYMPH NODE WITH NO TUMOR SEEN (0/1). 2. Lymph node, sentinel, biopsy, right external iliac - ONE BENIGN LYMPH NODE WITH NO TUMOR SEEN (0/1). 3. Lymph node, sentinel, biopsy, right obturator - ONE BENIGN LYMPH NODE WITH NO TUMOR SEEN (0/1). 4. Lymph nodes, regional resection, left pelvic - EIGHT BENIGN LYMPH NODES WITH NO TUMOR SEEN (0/8). 5. Uterus +/- tubes/ovaries, neoplastic - CERVIX WITH TWO RESIDUAL FOCI OF ADENOCARCINOMA IN SITU, EACH  MEASURING LESS THAN 0.2 CM IN GREATEST DIMENSION. - CERVIX ALSO DEMONSTRATES FOCAL HIGH GRADE SQUAMOUS INTRAEPITHELIAL LESION, CIN-II (MODERATE DYSPLASIA). - ECTOCERVICAL MARGIN IS NEGATIVE FOR DYSPLASIA, ATYPIA OR MALIGNANCY. - SEE COMMENT. ADDITIONAL FINDINGS: - SECRETORY PATTERN ENDOMETRIUM WITH UNDERLYING BENIGN MYOMETRIUM. - BENIGN FALLOPIAN TUBES WITH EVIDENCE OF TUBAL LIGATION AND BENIGN PARATUBAL CYST FORMATION ON THE LEFT. - BENIGN BILATERAL OVARIES WITH HEMORRHAGIC CORPUS LUTEUM FORMATION ON THE LEFT. - ASIDE FROM THE FINDINGS IN THE CERVIX, THERE IS NO ATYPIA OR MALIGNANCY PRESENT IN THE REMAINDER OF THE SPECIMEN.   Interval History:  She presents today with her daughter and an interpreter for follow up post-op and foley removal/trial void.  She reports doing well at home with no fever or chills. Tolerating diet with no nausea or emesis reported. Bowels functioning. Adequate urine output reported from the foley.  Her shortness of breath has improved but she reports fatigue after ambulating a moderate distance. Minimal pain reported. Using oxycodone as needed and requesting a refill on pain medication. Ambulating without difficulty. Denies vaginal bleeding but reporting vaginal itching. She is requesting a refill on the climara patch prescribed during her hospital stay.  She reports minor hot flashes at night time intermittently that do not interfere with her sleep.  No other concerns voiced.     Review of Systems Constitutional: Feels well.  No fever, chills, early satiety, unintentional weight loss or gain. Cardiovascular: No chest pain, shortness of breath, or edema.  Pulmonary: No cough or wheeze.  Gastrointestinal: No nausea, vomiting, or diarrhea. No bright red blood per rectum or change in bowel movement.  Genitourinary: No frequency, urgency, or dysuria. No vaginal bleeding or discharge.  Musculoskeletal: No myalgia or joint pain. Neurologic:  No weakness, numbness,  or change in gait.  Psychology: No depression, anxiety, or insomnia.  Current Meds:  Outpatient Encounter Prescriptions as of 10/24/2015  Medication Sig  . Cholecalciferol (VITAMIN D) 2000 UNITS tablet Take 2,000 Units by mouth daily.  Marland Kitchen oxyCODONE-acetaminophen (PERCOCET/ROXICET) 5-325 MG tablet Take 1-2 tablets by mouth every 4 (four) hours as needed (moderate to severe pain).  Marland Kitchen estradiol (CLIMARA) 0.05 mg/24hr patch Place 1 patch (0.05 mg total) onto the skin once a week.   No facility-administered encounter medications on file as of 10/24/2015.    Allergy:  Allergies  Allergen Reactions  . Ibuprofen Swelling    Social Hx:   Social History   Social History  . Marital Status: Married    Spouse Name: N/A  . Number of Children: N/A  . Years of Education: N/A   Occupational History  . Not on file.   Social History Main Topics  . Smoking status: Never Smoker   . Smokeless tobacco: Never Used  . Alcohol Use: No  . Drug Use: No  . Sexual Activity: Yes    Birth Control/ Protection: Surgical   Other Topics Concern  . Not on file   Social History Narrative    Past Surgical Hx:  Past Surgical History  Procedure Laterality Date  . Cesarean section  07-26-2003  &  12-28-2007    Bilateral Tubal Ligation with last one  . Endovenous ablation saphenous vein w/ laser Right 05-05-2014    EVLA RIGHT GREATER SAPHENOUS VEIN  BY TODD EARLY MD  . Cervical conization w/bx N/A 08/31/2015    Procedure: CONIZATION CERVIX WITH BIOPSY;  Surgeon: Everitt Amber, MD;  Location: [facility];  Service: Gynecology;  Laterality: N/A;  . Robotic assisted total hysterectomy with bilateral salpingo oopherectomy Bilateral 10/17/2015    Procedure: XI ROBOTIC ASSISTED TYPE III RADICAL TOTAL HYSTERECTOMY WITH BILATERAL SALPINGECTOMY OOPHORECTOMY WITH SENTINEL LYMPH NODE BIOPSY;  Surgeon: Everitt Amber, MD;  Location: [facility];  Service: Gynecology;  Laterality: Bilateral;    Past Medical Hx:   Past Medical History  Diagnosis Date  . Varicose veins   . Cervical adenocarcinoma ([place])   . History of abnormal cervical Pap smear     w/ cryoablation in 2000    Family Hx:  Family History  Problem Relation Age of Onset  . Hyperlipidemia Mother   . Diabetes Mother   . Varicose Veins Sister     Vitals:  Blood pressure 117/48, pulse 86, temperature 98.1 F (36.7 C), temperature source Oral, resp. rate 18, weight 144 lb 9 oz (65.573 kg), last menstrual period 09/25/2015.  Physical Exam:  General: Well developed, well nourished female in no acute distress. Alert and oriented x 3.  Neck: Supple without any enlargements.  Lymph node survey: No cervical, supraclavicular, or inguinal adenopathy.  Cardiovascular: Regular rate and rhythm. S1 and S2 normal.  Lungs: Clear to auscultation bilaterally. No wheezes/crackles/rhonchi noted.  Skin: No rashes or lesions present. Back: No CVA tenderness.  Abdomen: Abdomen soft, non-tender and obese. Active bowel sounds in all quadrants. No evidence of a fluid wave or abdominal masses. Lap sites to the abdomen healing well without erythema or drainage. Extremities: No bilateral cyanosis, edema, or clubbing.  Foley draining clear, yellow urine.  165 cc of normal Cross instilled in the bladder via the catheter per Dr. Denman George without difficulty then the foley was removed at 11:05am.  Patient reporting feelings of urination after 165 cc instilled.  Patient was able to void 175  cc of clear urine with Cross at 11:14am.  No dysuria reported.   Assessment/Plan:  44 year old female s/pvrobotic-assisted type III radical laparoscopic hysterectomy with bilateral salpingoophorectomy and bilateral pelvic lymphadenectomy on 10/17/15.  She is doing well post-operatively and passed the voiding trial at this time.  Reportable signs and symptoms reviewed.  Post-operative instruction reinforced.  Follow up with Dr. Denman George previously scheduled or sooner if needed. She is  advised to continue monitoring her urine output and call for decreased urine amount, increased abdominal pain or swelling, or for any new symptoms.  She is advised to call for any needs or concerns.      CROSS, MELISSA DEAL, NP 10/24/2015, 1:59 PM

## 2015-11-06 ENCOUNTER — Ambulatory Visit: Payer: BLUE CROSS/BLUE SHIELD | Attending: Gynecologic Oncology | Admitting: Gynecologic Oncology

## 2015-11-06 ENCOUNTER — Encounter: Payer: Self-pay | Admitting: Gynecologic Oncology

## 2015-11-06 VITALS — BP 93/55 | HR 88 | Temp 98.4°F | Resp 18 | Ht 59.5 in | Wt 147.1 lb

## 2015-11-06 DIAGNOSIS — Z08 Encounter for follow-up examination after completed treatment for malignant neoplasm: Secondary | ICD-10-CM | POA: Insufficient documentation

## 2015-11-06 DIAGNOSIS — C539 Malignant neoplasm of cervix uteri, unspecified: Secondary | ICD-10-CM

## 2015-11-06 DIAGNOSIS — Z8541 Personal history of malignant neoplasm of cervix uteri: Secondary | ICD-10-CM | POA: Insufficient documentation

## 2015-11-06 DIAGNOSIS — Z90710 Acquired absence of both cervix and uterus: Secondary | ICD-10-CM | POA: Diagnosis not present

## 2015-11-06 DIAGNOSIS — E8941 Symptomatic postprocedural ovarian failure: Secondary | ICD-10-CM | POA: Diagnosis not present

## 2015-11-06 DIAGNOSIS — C801 Malignant (primary) neoplasm, unspecified: Secondary | ICD-10-CM

## 2015-11-06 NOTE — Patient Instructions (Signed)
You may resume normal activities including driving.  You may return to work in one week.  No intercourse, nothing in the vagina for 3 more weeks.  Plan to follow up with Dr. Denman George in June or sooner if needed

## 2015-11-06 NOTE — Progress Notes (Signed)
Follow Up Note: Gyn-Onc  Krista Cross 45 y.o. female  CC:  Chief Complaint  Patient presents with  . adenocarcinoma    MD follow up   Assessment/Plan:  45 year old female with a history of stage IB1 adenocarcinoma of the cervix, s/pvrobotic-assisted type III radical laparoscopic hysterectomy with bilateral salpingoophorectomy and bilateral pelvic lymphadenectomy on 10/17/15.    She is doing well post-operatively. We discussed her diagnosis and findings of endometriosis and extensive pelvic adhesions (hence our performing a BSO). I discussed the favorable findings on pathology and that she did not meet risk factors that necessitate adjuvant therapy.  I discussed the risk for recurrence and typical symptoms that are manifested with this. I informed the patient to notify us if these develop and see [place] prior to her scheduled appointment.  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 I counseled her that these symptoms typically resolve spontaneously but take time.  She appears to be voiding urine normally, but I counseled her regarding symptoms of urinary retention and to notify us of these if they develop.  She should continue to use estrogen replacement therapy until she is of age of natural menopause.  Follow-up: 3 monthly exams for 3 years, 6 monthly until 5 years, then annual thereafter. Annual pap with HPV to screen for new HPV related dysplasia (not a screening tool for recurrence of her cervical cancer). She will follow-up with me in 3 months and with Dr Toney Rakes in 6 months.  HPI: Krista Cross is a 45 year old female initially seen in consultation at the request of Dr Toney Rakes for villoglandular adenocarcinoma of the endocervix . The patient had an AGUS pap smear in June, 2016 with high risk HPV detected. Colposcopy and directed biopsies were performed on 08/03/15 which revealed a normal appearing cervix. The endometrium  was sampled and showed proliferative endometrium with simple hyperplasia (no atypia), and the endocervical biopsy revealed adenocarcinoma with villoglandular endometrioid features.   On 08/31/15, she underwent a cold knife conization of the cervix.  Final pathology revealed:1. Cervix, cone ADENOCARCINOMA IN SITU, ENDOMETRIOID TYPE WITH SUPERFICIAL INVASION THE CARCINOMA IS1.3 CM IN LENGTH, 0.6 CM IN THICKNESS.  THE ENDOCERVICAL RESECTION MARGIN IS POSITIVE FROM 12 TO 6 O'CLOCK.  NO LYMPHOVASCULAR INVASION IDENTIFIED 2. Endocervix, curettage, post cone:  DETACHED SMALL FRAGMENT OF ADENOCARCINOMA   Based on the cone findings, it was recommended she proceed with a radical hysterectomy.  On 10/17/15, she underwent a Robotic-assisted type III radical laparoscopic hysterectomy with bilateral salpingoophorectomy and bilateral pelvic lymphadenectomy.  Final pathology revealed:  Diagnosis 1. Lymph node, sentinel, biopsy, right external iliac - ONE BENIGN LYMPH NODE WITH NO TUMOR SEEN (0/1). 2. Lymph node, sentinel, biopsy, right external iliac - ONE BENIGN LYMPH NODE WITH NO TUMOR SEEN (0/1). 3. Lymph node, sentinel, biopsy, right obturator - ONE BENIGN LYMPH NODE WITH NO TUMOR SEEN (0/1). 4. Lymph nodes, regional resection, left pelvic - EIGHT BENIGN LYMPH NODES WITH NO TUMOR SEEN (0/8). 5. Uterus +/- tubes/ovaries, neoplastic - CERVIX WITH TWO RESIDUAL FOCI OF ADENOCARCINOMA IN SITU, EACH MEASURING LESS THAN 0.2 CM IN GREATEST DIMENSION. - CERVIX ALSO DEMONSTRATES FOCAL HIGH GRADE SQUAMOUS INTRAEPITHELIAL LESION, CIN-II (MODERATE DYSPLASIA). - ECTOCERVICAL MARGIN IS NEGATIVE FOR DYSPLASIA, ATYPIA OR MALIGNANCY. - SEE COMMENT. ADDITIONAL FINDINGS: - SECRETORY PATTERN ENDOMETRIUM WITH UNDERLYING BENIGN MYOMETRIUM. - BENIGN FALLOPIAN TUBES WITH EVIDENCE OF TUBAL LIGATION AND BENIGN PARATUBAL CYST FORMATION ON THE LEFT. - BENIGN BILATERAL OVARIES WITH HEMORRHAGIC CORPUS LUTEUM FORMATION  ON THE  LEFT. - ASIDE FROM THE FINDINGS IN THE CERVIX, THERE IS NO ATYPIA OR MALIGNANCY PRESENT IN THE REMAINDER OF THE SPECIMEN.   Interval History:  She presents today with her daughter and an interpreter for routine follow-up.   Since surgery she is has been doing well with minimal pain and no bleeding. She voids urine, and feels that it is a normal volume. Sometimes she needs to strain to fully evacuate.  Review of Systems Constitutional: Feels well.  No fever, chills, early satiety, unintentional weight loss or gain. Cardiovascular: No chest pain, shortness of breath, or edema.  Pulmonary: No cough or wheeze.  Gastrointestinal: No nausea, vomiting, or diarrhea. No bright red blood per rectum or change in bowel movement.  Genitourinary: No frequency, urgency, or dysuria. No vaginal bleeding or discharge.  Musculoskeletal: No myalgia or joint pain. Neurologic: No weakness, numbness, or change in gait.  Psychology: No depression, anxiety, or insomnia.  Current Meds:  Outpatient Encounter Prescriptions as of 11/06/2015  Medication Sig  . Cholecalciferol (VITAMIN D) 2000 UNITS tablet Take 2,000 Units by mouth daily.  Marland Kitchen estradiol (CLIMARA) 0.05 mg/24hr patch Place 1 patch (0.05 mg total) onto the skin once a week.  Marland Kitchen oxyCODONE-acetaminophen (PERCOCET/ROXICET) 5-325 MG tablet Take 1-2 tablets by mouth every 4 (four) hours as needed (moderate to severe pain). (Patient not taking: Reported on 11/06/2015)   No facility-administered encounter medications on file as of 11/06/2015.    Allergy:  Allergies  Allergen Reactions  . Ibuprofen Swelling    Social Hx:   Social History   Social History  . Marital Status: Married    Spouse Name: N/A  . Number of Children: N/A  . Years of Education: N/A   Occupational History  . Not on file.   Social History Main Topics  . Smoking status: Never Smoker   . Smokeless tobacco: Never Used  . Alcohol Use: No  . Drug Use: No  . Sexual Activity: Yes     Birth Control/ Protection: Surgical   Other Topics Concern  . Not on file   Social History Narrative    Past Surgical Hx:  Past Surgical History  Procedure Laterality Date  . Cesarean section  07-26-2003  &  12-28-2007    Bilateral Tubal Ligation with last one  . Endovenous ablation saphenous vein w/ laser Right 05-05-2014    EVLA RIGHT GREATER SAPHENOUS VEIN  BY TODD EARLY MD  . Cervical conization w/bx N/A 08/31/2015    Procedure: CONIZATION CERVIX WITH BIOPSY;  Surgeon: Everitt Amber, MD;  Location: [facility];  Service: Gynecology;  Laterality: N/A;  . Robotic assisted total hysterectomy with bilateral salpingo oopherectomy Bilateral 10/17/2015    Procedure: XI ROBOTIC ASSISTED TYPE III RADICAL TOTAL HYSTERECTOMY WITH BILATERAL SALPINGECTOMY OOPHORECTOMY WITH SENTINEL LYMPH NODE BIOPSY;  Surgeon: Everitt Amber, MD;  Location: [facility];  Service: Gynecology;  Laterality: Bilateral;    Past Medical Hx:  Past Medical History  Diagnosis Date  . Varicose veins   . Cervical adenocarcinoma ([place])   . History of abnormal cervical Pap smear     w/ cryoablation in 2000    Family Hx:  Family History  Problem Relation Age of Onset  . Hyperlipidemia Mother   . Diabetes Mother   . Varicose Veins Sister     Vitals:  Blood pressure 93/55, pulse 88, temperature 98.4 F (36.9 C), temperature source Oral, resp. rate 18, height 4' 11.5" (1.511 m), weight 147 lb 1.6 oz (66.724  kg), last menstrual period 09/25/2015, SpO2 100 %.  Physical Exam:  General: Well developed, well nourished female in no acute distress. Alert and oriented x 3.  Neck: Supple without any enlargements.  Lymph node survey: No cervical, supraclavicular, or inguinal adenopathy.  Cardiovascular: Regular rate and rhythm. S1 and S2 normal.  Lungs: Clear to auscultation bilaterally. No wheezes/crackles/rhonchi noted.  Skin: No rashes or lesions present. Back: No CVA tenderness.  Abdomen: Abdomen soft, non-tender  and obese. Active bowel sounds in all quadrants. No evidence of a fluid wave or abdominal masses. Lap sites to the abdomen healing well without erythema or drainage. Genito-urinary: normal EFG. Normal vagina. Vaginal cuff intact and healing normally. No blood. Extremities: No bilateral cyanosis, edema, or clubbing.   30 minutes of direct face to face counseling time was spent with the patient. This included discussion about prognosis, therapy recommendations and postoperative side effects and are beyond the scope of routine postoperative care.  Donaciano Eva, MD 11/06/2015, 2:50 PM

## 2015-11-21 NOTE — Telephone Encounter (Signed)
This is not my note encounter

## 2015-12-27 LAB — HM MAMMOGRAPHY

## 2016-01-01 ENCOUNTER — Encounter: Payer: Self-pay | Admitting: General Practice

## 2016-02-15 ENCOUNTER — Ambulatory Visit: Payer: BLUE CROSS/BLUE SHIELD | Admitting: Gynecology

## 2016-02-19 ENCOUNTER — Encounter: Payer: Self-pay | Admitting: Gynecologic Oncology

## 2016-02-19 ENCOUNTER — Ambulatory Visit: Payer: BLUE CROSS/BLUE SHIELD | Attending: Gynecologic Oncology | Admitting: Gynecologic Oncology

## 2016-02-19 VITALS — BP 104/74 | HR 84 | Temp 98.7°F | Resp 18 | Ht 59.5 in | Wt 149.8 lb

## 2016-02-19 DIAGNOSIS — Z886 Allergy status to analgesic agent status: Secondary | ICD-10-CM | POA: Diagnosis not present

## 2016-02-19 DIAGNOSIS — Z90722 Acquired absence of ovaries, bilateral: Secondary | ICD-10-CM | POA: Insufficient documentation

## 2016-02-19 DIAGNOSIS — E28319 Asymptomatic premature menopause: Secondary | ICD-10-CM

## 2016-02-19 DIAGNOSIS — I8390 Asymptomatic varicose veins of unspecified lower extremity: Secondary | ICD-10-CM | POA: Diagnosis not present

## 2016-02-19 DIAGNOSIS — Z90710 Acquired absence of both cervix and uterus: Secondary | ICD-10-CM | POA: Insufficient documentation

## 2016-02-19 DIAGNOSIS — C539 Malignant neoplasm of cervix uteri, unspecified: Secondary | ICD-10-CM | POA: Insufficient documentation

## 2016-02-19 DIAGNOSIS — Z8541 Personal history of malignant neoplasm of cervix uteri: Secondary | ICD-10-CM | POA: Diagnosis not present

## 2016-02-19 DIAGNOSIS — Z79890 Hormone replacement therapy (postmenopausal): Secondary | ICD-10-CM | POA: Diagnosis not present

## 2016-02-19 NOTE — Progress Notes (Signed)
Follow Up Note: Gyn-Onc  Krista Cross 45 y.o. female  CC:  Chief Complaint  Patient presents with  . Cervical Cancer    Follow up   Assessment/Plan:  45 year old female with a history of stage IB1 adenocarcinoma of the cervix, s/pvrobotic-assisted type III radical laparoscopic hysterectomy with bilateral salpingoophorectomy and bilateral pelvic lymphadenectomy on 10/17/15.   Low risk factors therefore no adjuvant therapy prescribed postop.  No evidence of disease on today's exam.  I discussed the risk for recurrence and typical symptoms that are manifested with this. I informed the patient to notify us if these develop and see [place] prior to her scheduled appointment.  She should continue to use estrogen replacement therapy until she is of age of natural menopause.  Follow-up: 3 monthly exams for 3 years, 6 monthly until 5 years, then annual thereafter. Annual pap with HPV to screen for new HPV related dysplasia (not a screening tool for recurrence of her cervical cancer). She will follow-up with me in 3 months and with Dr Toney Rakes in 6 months.  HPI: Krista Cross is a 45 year old female initially seen in consultation at the request of Dr Toney Rakes for villoglandular adenocarcinoma of the endocervix . The patient had an AGUS pap smear in June, 2016 with high risk HPV detected. Colposcopy and directed biopsies were performed on 08/03/15 which revealed a normal appearing cervix. The endometrium was sampled and showed proliferative endometrium with simple hyperplasia (no atypia), and the endocervical biopsy revealed adenocarcinoma with villoglandular endometrioid features.   On 08/31/15, she underwent a cold knife conization of the cervix.  Final pathology revealed:1. Cervix, cone ADENOCARCINOMA IN SITU, ENDOMETRIOID TYPE WITH SUPERFICIAL INVASION THE CARCINOMA IS1.3 CM IN LENGTH, 0.6 CM IN THICKNESS.  THE ENDOCERVICAL RESECTION MARGIN IS POSITIVE FROM 12 TO 6 O'CLOCK.  NO  LYMPHOVASCULAR INVASION IDENTIFIED 2. Endocervix, curettage, post cone:  DETACHED SMALL FRAGMENT OF ADENOCARCINOMA   Based on the cone findings, it was recommended she proceed with a radical hysterectomy.  On 10/17/15, she underwent a Robotic-assisted type III radical laparoscopic hysterectomy with bilateral salpingoophorectomy and bilateral pelvic lymphadenectomy.  Final pathology revealed:  Diagnosis 1. Lymph node, sentinel, biopsy, right external iliac - ONE BENIGN LYMPH NODE WITH NO TUMOR SEEN (0/1). 2. Lymph node, sentinel, biopsy, right external iliac - ONE BENIGN LYMPH NODE WITH NO TUMOR SEEN (0/1). 3. Lymph node, sentinel, biopsy, right obturator - ONE BENIGN LYMPH NODE WITH NO TUMOR SEEN (0/1). 4. Lymph nodes, regional resection, left pelvic - EIGHT BENIGN LYMPH NODES WITH NO TUMOR SEEN (0/8). 5. Uterus +/- tubes/ovaries, neoplastic - CERVIX WITH TWO RESIDUAL FOCI OF ADENOCARCINOMA IN SITU, EACH MEASURING LESS THAN 0.2 CM IN GREATEST DIMENSION. - CERVIX ALSO DEMONSTRATES FOCAL HIGH GRADE SQUAMOUS INTRAEPITHELIAL LESION, CIN-II (MODERATE DYSPLASIA). - ECTOCERVICAL MARGIN IS NEGATIVE FOR DYSPLASIA, ATYPIA OR MALIGNANCY. - SEE COMMENT. ADDITIONAL FINDINGS: - SECRETORY PATTERN ENDOMETRIUM WITH UNDERLYING BENIGN MYOMETRIUM. - BENIGN FALLOPIAN TUBES WITH EVIDENCE OF TUBAL LIGATION AND BENIGN PARATUBAL CYST FORMATION ON THE LEFT. - BENIGN BILATERAL OVARIES WITH HEMORRHAGIC CORPUS LUTEUM FORMATION ON THE LEFT. - ASIDE FROM THE FINDINGS IN THE CERVIX, THERE IS NO ATYPIA OR MALIGNANCY PRESENT IN THE REMAINDER OF THE SPECIMEN.   Interval History:  She presents today with her interpreter for routine follow-up.   Since last visit she is has been doing well with minimal pain and no bleeding. She voids urine, and feels that it is a normal volume. Sometimes she needs to strain to fully evacuate. No swelling,  pain, bleeding.  Review of Systems Constitutional: Feels well.  No fever, chills,  early satiety, unintentional weight loss or gain. Cardiovascular: No chest pain, shortness of breath, or edema.  Pulmonary: No cough or wheeze.  Gastrointestinal: No nausea, vomiting, or diarrhea. No bright red blood per rectum or change in bowel movement.  Genitourinary: No frequency, urgency, or dysuria. No vaginal bleeding or discharge.  Musculoskeletal: No myalgia or joint pain. Neurologic: No weakness, numbness, or change in gait.  Psychology: No depression, anxiety, or insomnia.  Current Meds:  Outpatient Encounter Prescriptions as of 02/19/2016  Medication Sig  . Cholecalciferol (VITAMIN D) 2000 UNITS tablet Take 2,000 Units by mouth daily.  Marland Kitchen estradiol (CLIMARA) 0.05 mg/24hr patch Place 1 patch (0.05 mg total) onto the skin once a week.  . [DISCONTINUED] oxyCODONE-acetaminophen (PERCOCET/ROXICET) 5-325 MG tablet Take 1-2 tablets by mouth every 4 (four) hours as needed (moderate to severe pain). (Patient not taking: Reported on 11/06/2015)   No facility-administered encounter medications on file as of 02/19/2016.    Allergy:  Allergies  Allergen Reactions  . Ibuprofen Swelling    Social Hx:   Social History   Social History  . Marital Status: Married    Spouse Name: N/A  . Number of Children: N/A  . Years of Education: N/A   Occupational History  . Not on file.   Social History Main Topics  . Smoking status: Never Smoker   . Smokeless tobacco: Never Used  . Alcohol Use: No  . Drug Use: No  . Sexual Activity: Yes    Birth Control/ Protection: Surgical   Other Topics Concern  . Not on file   Social History Narrative    Past Surgical Hx:  Past Surgical History  Procedure Laterality Date  . Cesarean section  07-26-2003  &  12-28-2007    Bilateral Tubal Ligation with last one  . Endovenous ablation saphenous vein w/ laser Right 05-05-2014    EVLA RIGHT GREATER SAPHENOUS VEIN  BY TODD EARLY MD  . Cervical conization w/bx N/A 08/31/2015    Procedure: CONIZATION  CERVIX WITH BIOPSY;  Surgeon: Everitt Amber, MD;  Location: [facility];  Service: Gynecology;  Laterality: N/A;  . Robotic assisted total hysterectomy with bilateral salpingo oopherectomy Bilateral 10/17/2015    Procedure: XI ROBOTIC ASSISTED TYPE III RADICAL TOTAL HYSTERECTOMY WITH BILATERAL SALPINGECTOMY OOPHORECTOMY WITH SENTINEL LYMPH NODE BIOPSY;  Surgeon: Everitt Amber, MD;  Location: [facility];  Service: Gynecology;  Laterality: Bilateral;    Past Medical Hx:  Past Medical History  Diagnosis Date  . Varicose veins   . Cervical adenocarcinoma ([place])   . History of abnormal cervical Pap smear     w/ cryoablation in 2000    Family Hx:  Family History  Problem Relation Age of Onset  . Hyperlipidemia Mother   . Diabetes Mother   . Varicose Veins Sister     Vitals:  Blood pressure 104/74, pulse 84, temperature 98.7 F (37.1 C), temperature source Oral, resp. rate 18, height 4' 11.5" (1.511 m), weight 149 lb 12.8 oz (67.949 kg), SpO2 100 %.  Physical Exam:  General: Well developed, well nourished female in no acute distress. Alert and oriented x 3.  Neck: Supple without any enlargements.  Lymph node survey: No cervical, supraclavicular, or inguinal adenopathy.  Cardiovascular: Regular rate and rhythm. S1 and S2 normal.  Lungs: Clear to auscultation bilaterally. No wheezes/crackles/rhonchi noted.  Skin: No rashes or lesions present. Back: No CVA tenderness.  Abdomen: Abdomen soft,  non-tender and obese. Active bowel sounds in all quadrants. No evidence of a fluid wave or abdominal masses. Incisions healed Genito-urinary: normal EFG. Normal vagina. Vaginal cuff intact and healed. No lesions. No blood. Extremities: No bilateral cyanosis, edema, or clubbing.   Donaciano Eva, MD 02/19/2016, 3:07 PM

## 2016-02-19 NOTE — Patient Instructions (Signed)
Plan to follow up with Dr. Denman George in three months and Dr. Toney Rakes in six months.  Please call for any questions, concerns, or new symptoms such as vaginal bleeding.

## 2016-05-22 ENCOUNTER — Ambulatory Visit: Payer: BLUE CROSS/BLUE SHIELD | Attending: Gynecologic Oncology | Admitting: Gynecologic Oncology

## 2016-05-22 ENCOUNTER — Encounter: Payer: Self-pay | Admitting: Gynecologic Oncology

## 2016-05-22 VITALS — BP 103/65 | HR 82 | Temp 98.3°F | Resp 18 | Ht 59.5 in | Wt 152.5 lb

## 2016-05-22 DIAGNOSIS — E8941 Symptomatic postprocedural ovarian failure: Secondary | ICD-10-CM | POA: Diagnosis not present

## 2016-05-22 DIAGNOSIS — Z9889 Other specified postprocedural states: Secondary | ICD-10-CM | POA: Diagnosis not present

## 2016-05-22 DIAGNOSIS — Z79899 Other long term (current) drug therapy: Secondary | ICD-10-CM | POA: Diagnosis not present

## 2016-05-22 DIAGNOSIS — Z888 Allergy status to other drugs, medicaments and biological substances status: Secondary | ICD-10-CM | POA: Insufficient documentation

## 2016-05-22 DIAGNOSIS — R51 Headache: Secondary | ICD-10-CM | POA: Diagnosis not present

## 2016-05-22 DIAGNOSIS — Z90710 Acquired absence of both cervix and uterus: Secondary | ICD-10-CM | POA: Insufficient documentation

## 2016-05-22 DIAGNOSIS — Z8541 Personal history of malignant neoplasm of cervix uteri: Secondary | ICD-10-CM | POA: Insufficient documentation

## 2016-05-22 DIAGNOSIS — R519 Headache, unspecified: Secondary | ICD-10-CM | POA: Insufficient documentation

## 2016-05-22 DIAGNOSIS — E8940 Asymptomatic postprocedural ovarian failure: Secondary | ICD-10-CM | POA: Insufficient documentation

## 2016-05-22 DIAGNOSIS — C801 Malignant (primary) neoplasm, unspecified: Secondary | ICD-10-CM

## 2016-05-22 MED ORDER — ESTROGENS CONJUGATED 0.625 MG PO TABS: 0.6250 mg | ORAL_TABLET | Freq: Every day | ORAL | 11 refills | Status: DC

## 2016-05-22 NOTE — Patient Instructions (Signed)
Plan to have a CT scan of the head for evaluation of your headaches.  We will call you with the results of your CT scan and with Dr. Serita Grit recommendations.  We have also send premarin 0.625 daily oral tablets to your pharmacy and you can discontinue use of the patch.  Plan to follow up in three months or sooner if needed.

## 2016-05-22 NOTE — Progress Notes (Signed)
Follow Up Note: Gyn-Onc  Krista Cross 45 y.o. female  CC:  Chief Complaint  Patient presents with  . adenocarcinoma    follow up visit   Assessment/Plan:  45 year old female with a history of stage IB1 adenocarcinoma of the cervix, s/pvrobotic-assisted type III radical laparoscopic hysterectomy with bilateral salpingoophorectomy and bilateral pelvic lymphadenectomy on 10/17/15.   Low risk factors therefore no adjuvant therapy prescribed postop.  No evidence of disease on today's exam.  I discussed the risk for recurrence and typical symptoms that are manifested with this. I informed the patient to notify us if these develop and see [place] prior to her scheduled appointment.  Prescribed oral premarin for surgical menopause.  Head CT with and without contrast to rule out mets to brain as etiology for headaches. If negative, will recommend referral to headache clinic.  Follow-up: 3 monthly exams for 2 years, 6 monthly until 5 years, then annual thereafter. Annual pap with HPV to screen for new HPV related dysplasia (not a screening tool for recurrence of her cervical cancer). She will follow-up with me in 3 months and with Dr Toney Rakes in 6 months. Next pap due in February 2018.  HPI: Krista Cross is a 45 year old female initially seen in consultation at the request of Dr Toney Rakes for villoglandular adenocarcinoma of the endocervix . The patient had an AGUS pap smear in June, 2016 with high risk HPV detected. Colposcopy and directed biopsies were performed on 08/03/15 which revealed a normal appearing cervix. The endometrium was sampled and showed proliferative endometrium with simple hyperplasia (no atypia), and the endocervical biopsy revealed adenocarcinoma with villoglandular endometrioid features.   On 08/31/15, she underwent a cold knife conization of the cervix.  Final pathology revealed:1. Cervix, cone ADENOCARCINOMA IN SITU, ENDOMETRIOID TYPE WITH SUPERFICIAL  INVASION THE CARCINOMA IS1.3 CM IN LENGTH, 0.6 CM IN THICKNESS.  THE ENDOCERVICAL RESECTION MARGIN IS POSITIVE FROM 12 TO 6 O'CLOCK.  NO LYMPHOVASCULAR INVASION IDENTIFIED 2. Endocervix, curettage, post cone:  DETACHED SMALL FRAGMENT OF ADENOCARCINOMA   Based on the cone findings, it was recommended she proceed with a radical hysterectomy.  On 10/17/15, she underwent a Robotic-assisted type III radical laparoscopic hysterectomy with bilateral salpingoophorectomy and bilateral pelvic lymphadenectomy.  Final pathology revealed:  Diagnosis 1. Lymph node, sentinel, biopsy, right external iliac - ONE BENIGN LYMPH NODE WITH NO TUMOR SEEN (0/1). 2. Lymph node, sentinel, biopsy, right external iliac - ONE BENIGN LYMPH NODE WITH NO TUMOR SEEN (0/1). 3. Lymph node, sentinel, biopsy, right obturator - ONE BENIGN LYMPH NODE WITH NO TUMOR SEEN (0/1). 4. Lymph nodes, regional resection, left pelvic - EIGHT BENIGN LYMPH NODES WITH NO TUMOR SEEN (0/8). 5. Uterus +/- tubes/ovaries, neoplastic - CERVIX WITH TWO RESIDUAL FOCI OF ADENOCARCINOMA IN SITU, EACH MEASURING LESS THAN 0.2 CM IN GREATEST DIMENSION. - CERVIX ALSO DEMONSTRATES FOCAL HIGH GRADE SQUAMOUS INTRAEPITHELIAL LESION, CIN-II (MODERATE DYSPLASIA). - ECTOCERVICAL MARGIN IS NEGATIVE FOR DYSPLASIA, ATYPIA OR MALIGNANCY. - SEE COMMENT. ADDITIONAL FINDINGS: - SECRETORY PATTERN ENDOMETRIUM WITH UNDERLYING BENIGN MYOMETRIUM. - BENIGN FALLOPIAN TUBES WITH EVIDENCE OF TUBAL LIGATION AND BENIGN PARATUBAL CYST FORMATION ON THE LEFT. - BENIGN BILATERAL OVARIES WITH HEMORRHAGIC CORPUS LUTEUM FORMATION ON THE LEFT. - ASIDE FROM THE FINDINGS IN THE CERVIX, THERE IS NO ATYPIA OR MALIGNANCY PRESENT IN THE REMAINDER OF THE SPECIMEN.   Interval History:  She presents today with her interpreter for routine follow-up.   Since last visit she is reporting new headaches, left side, once per week, lasts 3  days, moderately severe, not relieved by tylenol, not  associated with aura or neurologic or optic/olfactory symptoms. Seems to be triggered by either not enough or too much sleep.  She has been getting hot flashes because she isn't wearing her patch because it falls off.  Review of Systems Constitutional:+ hot flashes. Cardiovascular: No chest pain, shortness of breath, or edema.  Pulmonary: No cough or wheeze.  Gastrointestinal: No nausea, vomiting, or diarrhea. No bright red blood per rectum or change in bowel movement.  Genitourinary: No frequency, urgency, or dysuria. No vaginal bleeding or discharge.  Musculoskeletal: No myalgia or joint pain. Neurologic: + headache  Psychology: No depression, anxiety, or insomnia.  Current Meds:  Outpatient Encounter Prescriptions as of 05/22/2016  Medication Sig  . Cholecalciferol (VITAMIN D) 2000 UNITS tablet Take 2,000 Units by mouth daily.  . [DISCONTINUED] estradiol (CLIMARA) 0.05 mg/24hr patch Place 1 patch (0.05 mg total) onto the skin once a week.  . estrogens, conjugated, (PREMARIN) 0.625 MG tablet Take 1 tablet (0.625 mg total) by mouth daily. Take daily for 21 days then do not take for 7 days.   No facility-administered encounter medications on file as of 05/22/2016.     Allergy:  Allergies  Allergen Reactions  . Ibuprofen Swelling    Social Hx:   Social History   Social History  . Marital status: Married    Spouse name: N/A  . Number of children: N/A  . Years of education: N/A   Occupational History  . Not on file.   Social History Main Topics  . Smoking status: Never Smoker  . Smokeless tobacco: Never Used  . Alcohol use No  . Drug use: No  . Sexual activity: Yes    Birth control/ protection: Surgical   Other Topics Concern  . Not on file   Social History Narrative  . No narrative on file    Past Surgical Hx:  Past Surgical History:  Procedure Laterality Date  . CERVICAL CONIZATION W/BX N/A 08/31/2015   Procedure: CONIZATION CERVIX WITH BIOPSY;  Surgeon: Everitt Amber, MD;  Location: [facility];  Service: Gynecology;  Laterality: N/A;  . CESAREAN SECTION  07-26-2003  &  12-28-2007   Bilateral Tubal Ligation with last one  . ENDOVENOUS ABLATION SAPHENOUS VEIN W/ LASER Right 05-05-2014   EVLA RIGHT GREATER SAPHENOUS VEIN  BY TODD EARLY MD  . ROBOTIC ASSISTED TOTAL HYSTERECTOMY WITH BILATERAL SALPINGO OOPHERECTOMY Bilateral 10/17/2015   Procedure: XI ROBOTIC ASSISTED TYPE III RADICAL TOTAL HYSTERECTOMY WITH BILATERAL SALPINGECTOMY OOPHORECTOMY WITH SENTINEL LYMPH NODE BIOPSY;  Surgeon: Everitt Amber, MD;  Location: [facility];  Service: Gynecology;  Laterality: Bilateral;    Past Medical Hx:  Past Medical History:  Diagnosis Date  . Cervical adenocarcinoma ([place])   . History of abnormal cervical Pap smear    w/ cryoablation in 2000  . Varicose veins     Family Hx:  Family History  Problem Relation Age of Onset  . Hyperlipidemia Mother   . Diabetes Mother   . Varicose Veins Sister     Vitals:  Blood pressure 103/65, pulse 82, temperature 98.3 F (36.8 C), temperature source Oral, resp. rate 18, height 4' 11.5" (1.511 m), weight 152 lb 8 oz (69.2 kg), SpO2 100 %.  Physical Exam:  General: Well developed, well nourished female in no acute distress. Alert and oriented x 3.  Neck: Supple without any enlargements.  Lymph node survey: No cervical, supraclavicular, or inguinal adenopathy.  Cardiovascular: Regular rate and  rhythm. S1 and S2 normal.  Lungs: Clear to auscultation bilaterally. No wheezes/crackles/rhonchi noted.  Skin: No rashes or lesions present. Back: No CVA tenderness.  Abdomen: Abdomen soft, non-tender and obese. Active bowel sounds in all quadrants. No evidence of a fluid wave or abdominal masses. Incisions healed Genito-urinary: normal EFG. Normal vagina. Vaginal cuff intact and healed. No lesions. No blood. Extremities: No bilateral cyanosis, edema, or clubbing.   Donaciano Eva, MD 05/22/2016, 1:57 PM

## 2016-05-28 ENCOUNTER — Encounter (HOSPITAL_COMMUNITY): Payer: Self-pay | Admitting: Radiology

## 2016-05-28 ENCOUNTER — Ambulatory Visit (HOSPITAL_COMMUNITY)
Admission: RE | Admit: 2016-05-28 | Discharge: 2016-05-28 | Disposition: A | Discharge status: Home / Self Care | Payer: BLUE CROSS/BLUE SHIELD | Source: Ambulatory Visit | Attending: Gynecologic Oncology | Admitting: Gynecologic Oncology

## 2016-05-28 DIAGNOSIS — R519 Headache, unspecified: Secondary | ICD-10-CM

## 2016-05-28 DIAGNOSIS — R51 Headache: Secondary | ICD-10-CM | POA: Insufficient documentation

## 2016-05-28 MED ORDER — IOPAMIDOL (ISOVUE-300) INJECTION 61%
75.0000 mL | Freq: Once | INTRAVENOUS | Status: AC | PRN
  Administered 2016-05-28: 75 mL via INTRAVENOUS

## 2016-08-19 ENCOUNTER — Ambulatory Visit: Payer: BLUE CROSS/BLUE SHIELD | Attending: Gynecologic Oncology | Admitting: Gynecologic Oncology

## 2016-08-19 ENCOUNTER — Encounter: Payer: Self-pay | Admitting: Gynecologic Oncology

## 2016-08-19 VITALS — BP 104/62 | HR 90 | Temp 98.2°F | Resp 20 | Ht 59.5 in | Wt 150.7 lb

## 2016-08-19 DIAGNOSIS — C530 Malignant neoplasm of endocervix: Secondary | ICD-10-CM | POA: Insufficient documentation

## 2016-08-19 DIAGNOSIS — Z9079 Acquired absence of other genital organ(s): Secondary | ICD-10-CM | POA: Diagnosis not present

## 2016-08-19 DIAGNOSIS — Z8541 Personal history of malignant neoplasm of cervix uteri: Secondary | ICD-10-CM | POA: Diagnosis not present

## 2016-08-19 DIAGNOSIS — Z90722 Acquired absence of ovaries, bilateral: Secondary | ICD-10-CM | POA: Diagnosis not present

## 2016-08-19 DIAGNOSIS — C531 Malignant neoplasm of exocervix: Secondary | ICD-10-CM

## 2016-08-19 DIAGNOSIS — E8941 Symptomatic postprocedural ovarian failure: Secondary | ICD-10-CM

## 2016-08-19 DIAGNOSIS — Z90710 Acquired absence of both cervix and uterus: Secondary | ICD-10-CM | POA: Diagnosis not present

## 2016-08-19 DIAGNOSIS — C801 Malignant (primary) neoplasm, unspecified: Secondary | ICD-10-CM

## 2016-08-19 NOTE — Progress Notes (Signed)
Follow Up Note: Gyn-Onc  [name] 45 y.o. female  CC:  Chief Complaint  Patient presents with  . adenocarcinoma   Assessment/Plan:  45 year old female with a history of stage IB1 adenocarcinoma of the cervix, s/pvrobotic-assisted type III radical laparoscopic hysterectomy with bilateral salpingoophorectomy and bilateral pelvic lymphadenectomy on 10/17/15.   Low risk factors therefore no adjuvant therapy prescribed postop.  No evidence of disease on today's exam.  I discussed the risk for recurrence and typical symptoms that are manifested with this. I informed the patient to notify us if these develop and see [place] prior to her scheduled appointment.  Prescribed oral premarin for surgical menopause. However patient declines to take due to concern for cancer risk.  Follow-up: 3 monthly exams for 2 years, 6 monthly until 5 years, then annual thereafter. Annual pap with HPV to screen for new HPV related dysplasia (not a screening tool for recurrence of her cervical cancer). She will follow-up with Dr Toney Rakes in 3 months and with me in 6 months. Next pap due in February/March 2018.  HPI: Krista Cross is a 45 year old female initially seen in consultation at the request of Dr Toney Rakes for villoglandular adenocarcinoma of the endocervix . The patient had an AGUS pap smear in June, 2016 with high risk HPV detected. Colposcopy and directed biopsies were performed on 08/03/15 which revealed a normal appearing cervix. The endometrium was sampled and showed proliferative endometrium with simple hyperplasia (no atypia), and the endocervical biopsy revealed adenocarcinoma with villoglandular endometrioid features.   On 08/31/15, she underwent a cold knife conization of the cervix.  Final pathology revealed:1. Cervix, cone ADENOCARCINOMA IN SITU, ENDOMETRIOID TYPE WITH SUPERFICIAL INVASION THE CARCINOMA IS1.3 CM IN LENGTH, 0.6 CM IN THICKNESS.  THE ENDOCERVICAL RESECTION MARGIN IS  POSITIVE FROM 12 TO 6 O'CLOCK.  NO LYMPHOVASCULAR INVASION IDENTIFIED 2. Endocervix, curettage, post cone:  DETACHED SMALL FRAGMENT OF ADENOCARCINOMA   Based on the cone findings, it was recommended she proceed with a radical hysterectomy.  On 10/17/15, she underwent a Robotic-assisted type III radical laparoscopic hysterectomy with bilateral salpingoophorectomy and bilateral pelvic lymphadenectomy.  Final pathology revealed:  Diagnosis 1. Lymph node, sentinel, biopsy, right external iliac - ONE BENIGN LYMPH NODE WITH NO TUMOR SEEN (0/1). 2. Lymph node, sentinel, biopsy, right external iliac - ONE BENIGN LYMPH NODE WITH NO TUMOR SEEN (0/1). 3. Lymph node, sentinel, biopsy, right obturator - ONE BENIGN LYMPH NODE WITH NO TUMOR SEEN (0/1). 4. Lymph nodes, regional resection, left pelvic - EIGHT BENIGN LYMPH NODES WITH NO TUMOR SEEN (0/8). 5. Uterus +/- tubes/ovaries, neoplastic - CERVIX WITH TWO RESIDUAL FOCI OF ADENOCARCINOMA IN SITU, EACH MEASURING LESS THAN 0.2 CM IN GREATEST DIMENSION. - CERVIX ALSO DEMONSTRATES FOCAL HIGH GRADE SQUAMOUS INTRAEPITHELIAL LESION, CIN-II (MODERATE DYSPLASIA). - ECTOCERVICAL MARGIN IS NEGATIVE FOR DYSPLASIA, ATYPIA OR MALIGNANCY. - SEE COMMENT. ADDITIONAL FINDINGS: - SECRETORY PATTERN ENDOMETRIUM WITH UNDERLYING BENIGN MYOMETRIUM. - BENIGN FALLOPIAN TUBES WITH EVIDENCE OF TUBAL LIGATION AND BENIGN PARATUBAL CYST FORMATION ON THE LEFT. - BENIGN BILATERAL OVARIES WITH HEMORRHAGIC CORPUS LUTEUM FORMATION ON THE LEFT. - ASIDE FROM THE FINDINGS IN THE CERVIX, THERE IS NO ATYPIA OR MALIGNANCY PRESENT IN THE REMAINDER OF THE SPECIMEN.   Interval History:  She presents today with her interpreter for routine follow-up.   Since last visit she is reporting no new symptoms.  A CT head had been obtained in September after she reported new headaches. This was negative for intracranial mets. She reports the headaches are less.  Review of Systems Constitutional:+ hot  flashes. Cardiovascular: No chest pain, shortness of breath, or edema.  Pulmonary: No cough or wheeze.  Gastrointestinal: No nausea, vomiting, or diarrhea. No bright red blood per rectum or change in bowel movement.  Genitourinary: No frequency, urgency, or dysuria. No vaginal bleeding or discharge.  Musculoskeletal: No myalgia or joint pain. Neurologic: + headache  Psychology: No depression, anxiety, or insomnia.  Current Meds:  Outpatient Encounter Prescriptions as of 08/19/2016  Medication Sig  . Cholecalciferol (VITAMIN D) 2000 UNITS tablet Take 2,000 Units by mouth daily.  Marland Kitchen estrogens, conjugated, (PREMARIN) 0.625 MG tablet Take 1 tablet (0.625 mg total) by mouth daily. Take daily for 21 days then do not take for 7 days. (Patient not taking: Reported on 08/19/2016)   No facility-administered encounter medications on file as of 08/19/2016.     Allergy:  Allergies  Allergen Reactions  . Ibuprofen Swelling    Social Hx:   Social History   Social History  . Marital status: Married    Spouse name: N/A  . Number of children: N/A  . Years of education: N/A   Occupational History  . Not on file.   Social History Main Topics  . Smoking status: Never Smoker  . Smokeless tobacco: Never Used  . Alcohol use No  . Drug use: No  . Sexual activity: Yes    Birth control/ protection: Surgical   Other Topics Concern  . Not on file   Social History Narrative  . No narrative on file    Past Surgical Hx:  Past Surgical History:  Procedure Laterality Date  . CERVICAL CONIZATION W/BX N/A 08/31/2015   Procedure: CONIZATION CERVIX WITH BIOPSY;  Surgeon: Everitt Amber, MD;  Location: [facility];  Service: Gynecology;  Laterality: N/A;  . CESAREAN SECTION  07-26-2003  &  12-28-2007   Bilateral Tubal Ligation with last one  . ENDOVENOUS ABLATION SAPHENOUS VEIN W/ LASER Right 05-05-2014   EVLA RIGHT GREATER SAPHENOUS VEIN  BY TODD EARLY MD  . ROBOTIC ASSISTED TOTAL  HYSTERECTOMY WITH BILATERAL SALPINGO OOPHERECTOMY Bilateral 10/17/2015   Procedure: XI ROBOTIC ASSISTED TYPE III RADICAL TOTAL HYSTERECTOMY WITH BILATERAL SALPINGECTOMY OOPHORECTOMY WITH SENTINEL LYMPH NODE BIOPSY;  Surgeon: Everitt Amber, MD;  Location: [facility];  Service: Gynecology;  Laterality: Bilateral;    Past Medical Hx:  Past Medical History:  Diagnosis Date  . Cervical adenocarcinoma ([place])   . History of abnormal cervical Pap smear    w/ cryoablation in 2000  . Varicose veins     Family Hx:  Family History  Problem Relation Age of Onset  . Hyperlipidemia Mother   . Diabetes Mother   . Varicose Veins Sister     Vitals:  Blood pressure 104/62, pulse 90, temperature 98.2 F (36.8 C), temperature source Oral, resp. rate 20, height 4' 11.5" (1.511 m), weight 150 lb 11.2 oz (68.4 kg), last menstrual period 09/25/2015, SpO2 100 %.  Physical Exam:  General: Well developed, well nourished female in no acute distress. Alert and oriented x 3.  Neck: Supple without any enlargements.  Lymph node survey: No cervical, supraclavicular, or inguinal adenopathy.  Cardiovascular: Regular rate and rhythm. S1 and S2 normal.  Lungs: Clear to auscultation bilaterally. No wheezes/crackles/rhonchi noted.  Skin: No rashes or lesions present. Back: No CVA tenderness.  Abdomen: Abdomen soft, non-tender and obese. Active bowel sounds in all quadrants. No evidence of a fluid wave or abdominal masses. Incisions healed Genito-urinary: normal EFG. Normal vagina. Vaginal cuff  intact and healed. No lesions. No blood. Extremities: No bilateral cyanosis, edema, or clubbing.   Donaciano Eva, MD 08/19/2016, 3:08 PM

## 2016-11-01 ENCOUNTER — Encounter: Payer: Self-pay | Admitting: Gynecology

## 2016-11-01 ENCOUNTER — Ambulatory Visit (INDEPENDENT_AMBULATORY_CARE_PROVIDER_SITE_OTHER): Payer: BLUE CROSS/BLUE SHIELD | Admitting: Gynecology

## 2016-11-01 VITALS — BP 110/68 | Ht 59.0 in | Wt 150.0 lb

## 2016-11-01 DIAGNOSIS — Z23 Encounter for immunization: Secondary | ICD-10-CM

## 2016-11-01 DIAGNOSIS — Z79890 Hormone replacement therapy (postmenopausal): Secondary | ICD-10-CM | POA: Diagnosis not present

## 2016-11-01 DIAGNOSIS — R232 Flushing: Secondary | ICD-10-CM | POA: Diagnosis not present

## 2016-11-01 DIAGNOSIS — Z01411 Encounter for gynecological examination (general) (routine) with abnormal findings: Secondary | ICD-10-CM

## 2016-11-01 DIAGNOSIS — Z8639 Personal history of other endocrine, nutritional and metabolic disease: Secondary | ICD-10-CM | POA: Diagnosis not present

## 2016-11-01 DIAGNOSIS — R6882 Decreased libido: Secondary | ICD-10-CM

## 2016-11-01 DIAGNOSIS — Z8541 Personal history of malignant neoplasm of cervix uteri: Secondary | ICD-10-CM | POA: Diagnosis not present

## 2016-11-01 MED ORDER — ESTRADIOL 1 MG PO TABS: 1.0000 mg | ORAL_TABLET | Freq: Every day | ORAL | 4 refills | Status: DC

## 2016-11-01 NOTE — Addendum Note (Signed)
Addended by: Burnett Kanaris on: 11/01/2016 11:29 AM   Modules accepted: Orders

## 2016-11-01 NOTE — Patient Instructions (Signed)
Menopausia y terapia de reemplazo hormonal (Menopause and Hormone Replacement Therapy) QU ES LA TERAPIA DE REEMPLAZO HORMONAL? La terapia de reemplazo hormonal (TRH) es el uso de hormonas artificiales (sintticas) para Training and development officer las hormonas que el cuerpo deja de producir durante la menopausia. La menopausia es la etapa normal de la vida cuando los perodos Kellogg desaparecen y los ovarios dejan de producir hormonas femeninas, estrgenos y Immunologist. Esta falta de hormonas puede afectar la salud y causar sntomas indeseables. La TRH puede aliviar algunos de esos sntomas. CULES SON MIS OPCIONES PARA UNA TRH? La TRH puede consistir en hormonas sintticas de Oceanographer y Immunologist o puede consistir solamente en estrgeno (terapia de estrgenos solamente). Usted y su mdico decidirn qu tipo de TRH es la ms [place] para usted. Si elige Lucilla Edin TRH y tiene tero, generalmente se prescribe estrgeno y progesterona. La terapia de estrgenos solamente se [place] para las mujeres que no tienen tero. Las siguientes son algunas de las opciones para una TRH:  Pldoras.  Parches.  Geles.  Aerosoles.  Cremas vaginales.  Anillo vaginal.  Dispositivos vaginales. La cantidad de hormonas que tome y Physiological scientist en que deba tomarlas depender de su salud en particular. Es importante:  Comenzar una TRH con la dosificacin ms baja posible.  Interrumpa la TRH tan pronto como su mdico se lo indique.  Colabore con su mdico para estar bien informado y sentirse cmodo con sus decisiones. CULES SON LOS BENEFICIOS DE LA TRH? La TRH puede reducir la frecuencia y la gravedad de los sntomas de la menopausia. Los beneficios de la TRH pueden variar segn los sntomas de la menopausia que tenga, la gravedad de estos y su salud general. La TRH puede ayudarle a mejorar los siguientes sntomas de la menopausia:  Sofocones y sudores nocturnos. Estos consisten en una sensacin de calor que se  extiende por el rostro y el cuerpo. La piel se puede volver roja, como ruborizada. Los sudores nocturnos son oleadas de calor que ocurren al estar dormida o tratando de dormir.  Prdida de masa sea (osteoporosis). El organismo pierde calcio ms rpido despus de la menopausia, lo que debilita a los Jud. Esto puede aumentar el riesgo de huesos rotos (fracturas).  Sequedad vaginal. La membrana de la vagina se hace ms delgada y seca lo que puede causar dolor durante las relaciones sexuales o causar infecciones, ardor o picazn.  Infecciones en las vas urinarias.  Incontinencia urinaria. Esta es la disminucin de la capacidad de Chief Technology Officer la [place].  Irritabilidad.  Problemas de memoria de [employer]. CULES SON LOS RIESGOS DE LA TRH? Los riesgos de la TRH pueden variar segn su salud y su historia clnica. Los riesgos de la TRH tambin dependen de si usted recibe estrgeno y progesterona o solamente estrgeno.La TRH puede aumentar el riesgo de:  Tener prdidas. Estas ocurren cuando la vagina pierde repentinamente una pequea cantidad de [place].  Cncer de endometrio. Este cncer ocurre en la membrana del tero (endometrio).  Cncer de mama.  Aumenta la densidad del tejido de las [place]. Esto puede dificultar la deteccin de cncer de mamas con una radiografa de mamas (mamografa).  Ictus.  Infarto de miocardio.  Cogulos sanguneos.  Enfermedad de la vescula biliar. Los riesgos de la TRH pueden ser [employer] si tiene alguna de las siguientes afecciones:  Hotel manager de endometrio.  Enfermedad heptica.  Cardiopata.  Cncer de mama.  Antecedentes de cogulos sanguneos.  Antecedentes de ictus. CMO DEBO CUIDARME CUANDO ESTOY REALIZANDO UNA TRH?  [employer]  medicamentos de venta libre y los recetados solamente como se lo haya indicado el mdico.  Hgase mamografas, exmenes plvicos y chequeos con la frecuencia que le indique el mdico.  Hgase pruebas de Papanicolu  con la frecuencia que le indique el mdico. A esta prueba tambin se la denomina "Pap". Es una prueba de deteccin que se utiliza para Hydrographic surveyor signos de cncer en el cuello del tero y la vagina. La prueba de Papanicolu tambin puede identificar la presencia de infeccin o cambios precancerosos. Las pruebas de Papanicolaou se pueden Optometrist:  Cada 3 aos, a Proofreader de los 21 aos.  Cada 5 aos, a partir de los 30 aos, en combinacin con las pruebas que se realizan para Product manager presencia del virus del Engineer, technical sales (VPH).  Con mayor o menor frecuencia segn las afecciones mdicas que tenga, su edad y otros factores de [place].  Es su responsabilidad retirar el resultado de la prueba de Papanicolu. Consulte a su mdico o en el departamento donde se realice el procedimiento cundo estarn [employer].  Concurra a todas las visitas de control como se lo haya indicado el mdico. Esto es importante. [place]? Hable con el mdico en los siguientes casos:  Tiene alguno de estos sntomas:  Dolor o hinchazn en las piernas.  Falta de aire.  Dolor en el pecho.  Bultos o cambios en las mamas o en las [place].  Hablar arrastrando las palabras.  Dolor, ardor o sangrado al [employer].  Tiene alguno de estos sntomas:  Hemorragia vaginal inusual.  Mareos o dolores de [place].  Adormecimiento o debilidad en cualquier parte de los brazos o de las piernas.  Dolor en el abdomen. Esta informacin no tiene Marine scientist el consejo del mdico. Asegrese de hacerle al mdico cualquier pregunta que tenga. Document Released: 02/05/2008 Document Revised: 12/11/2015 Document Reviewed: 02/20/2015 Elsevier Interactive Patient Education  2017 Elsevier Inc. Influenza Virus Vaccine (Flucelvax) Sander Nephew es este medicamento? La VACUNA ANTIGRIPAL ayuda a disminuir el riesgo de contraer la influenza, tambin conocida como la gripe. La vacuna solo ayuda a protegerle  contra algunas cepas de influenza. Este medicamento puede ser utilizado para otros usos; si tiene alguna pregunta consulte con su proveedor de atencin mdica o con su farmacutico. MARCAS COMUNES: FLUCELVAX Qu le debo informar a mi profesional de la salud antes de tomar este medicamento? Necesita saber si usted presenta alguno de los siguientes problemas o situaciones: -trastorno de sangrado como hemofilia -fiebre o infeccin -sndrome de Guillain-Barre u otros problemas neurolgicos -problemas del sistema inmunolgico -infeccin por el virus de la inmunodeficiencia humana (VIH) o SIDA -niveles bajos de plaquetas en la sangre -esclerosis mltiple -una reaccin alrgica o inusual a las vacunas antigripales, a otros medicamentos, alimentos, colorantes o conservantes -si est embarazada o buscando quedar embarazada -si est amamantando a un beb Cmo debo utilizar este medicamento? Esta vacuna se administra mediante inyeccin por va intramuscular. Lo administra un profesional de [employer]	[place]. Recibir una copia de informacin escrita sobre la vacuna antes de cada vacuna. Asegrese de leer este folleto cada vez cuidadosamente. Este folleto puede cambiar con frecuencia. Hable con su pediatra para informarse acerca del uso de este medicamento en nios. Puede requerir atencin especial. Sobredosis: Pngase en contacto inmediatamente con un centro toxicolgico o una sala de urgencia si usted cree que haya tomado demasiado medicamento. ATENCIN: [employer] es solo para usted. No comparta este medicamento con nadie. Qu sucede si me olvido de una dosis? No se aplica en  este caso. Qu puede interactuar con este medicamento? -quimioterapia o radioterapia -medicamentos que suprimen el sistema inmunolgico, tales como etanercept, anakinra, infliximab y adalimumab -medicamentos que tratan o previenen cogulos sanguneos, como warfarina -fenitona -medicamentos esteroideos, como la prednisona o  la cortisona -teofilina -vacunas Puede ser que esta lista no menciona todas las posibles interacciones. Informe a su profesional de [employer]	[place] de [employer] productos a base de hierbas, medicamentos de [place] o suplementos nutritivos que est tomando. Si usted fuma, consume bebidas alcohlicas o si utiliza drogas ilegales, indqueselo tambin a su profesional de [employer]	[place]. Algunas sustancias pueden interactuar con su medicamento. A qu debo estar atento al usar [employer]? Informe a su mdico o a Barrister's clerk de la [employer] todos los efectos secundarios que persistan despus de 3 das. Llame a su proveedor de atencin mdica si se presentan sntomas inusuales dentro de las 6 semanas de recibir esta vacuna. Es posible que todava pueda contraer la gripe, pero la enfermedad no ser tan fuerte como normalmente. No puede contraer la gripe de esta vacuna. La vacuna antigripal no le protege contra resfros u otras enfermedades que pueden causar [place]. Debe vacunarse cada ao. Qu efectos secundarios puedo tener al [employer] este medicamento? Efectos secundarios que debe informar a su mdico o a Barrister's clerk de la salud tan pronto como sea posible: -reacciones alrgicas como erupcin cutnea, picazn o urticarias, hinchazn de la cara, labios o lengua Efectos secundarios que, por lo general, no requieren atencin mdica (debe informarlos a su mdico o a su profesional de la salud si persisten o si son molestos): -fiebre -dolor de cabeza -molestias y dolores musculares -dolor, sensibilidad, enrojecimiento o Estate agent de la inyeccin -cansancio Puede ser que esta lista no menciona todos los posibles efectos secundarios. Comunquese a su mdico por asesoramiento mdico [employer]. Usted puede informar los efectos secundarios a la FDA por telfono al 1-800-FDA-1088. Dnde debo guardar mi medicina? Esta vacuna se administrar por un profesional de la salud en una  [place], Engineer, mining, consultorio mdico u otro consultorio de un profesional de la salud. No se le suministrar esta vacuna para guardar en su domicilio. ATENCIN: Este folleto es un resumen. Puede ser que no cubra toda la posible informacin. Si usted tiene preguntas acerca de esta medicina, consulte con su mdico, su farmacutico o su profesional de Technical sales engineer.  2018 Elsevier/Gold Standard (2011-08-05 16:29:16)

## 2016-11-01 NOTE — Progress Notes (Signed)
Krista Cross [birth year] [id number]   History:    46 y.o.  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 Denman George. Patient was found to have extensive endometriosis and pelvic adhesion resulting in bilateral salpingo-oophorectomy. Patient did not need adjuvant therapy but close surveillance was the recommendation. Patient was started on transdermal estrogen patch but patient states it continues to follow-up if she would like an oral tablet to help with her hot flashes and decreased libido. She would like to have the flu vaccine today and will return later this week for fasting blood work.  Past medical history,surgical history, family history and social history were all reviewed and documented in the EPIC chart.  Gynecologic History Patient's last menstrual period was 09/25/2015. Contraception: status post hysterectomy Last Pap: See above. Results were: See above Last mammogram: April 2017. Results were: normal  Obstetric History OB History  Gravida Para Term Preterm AB Living  5 4     1 4   SAB TAB Ectopic Multiple Live Births  1            # Outcome Date GA Lbr Len/2nd Weight Sex Delivery Anes PTL Lv  5 SAB           4 Para      CS-LTranv     3 Para      CS-LTranv     2 Para      Vag-Spont     1 Para      Vag-Spont          ROS: A ROS was performed and pertinent positives and negatives are included in the history.  GENERAL: No fevers or chills. HEENT: No change in vision, no earache, sore throat or sinus congestion. NECK: No pain or stiffness. CARDIOVASCULAR: No chest pain or pressure. No palpitations. PULMONARY: No shortness of breath, cough or wheeze. GASTROINTESTINAL: No abdominal pain, nausea, vomiting or diarrhea, melena or bright red blood per rectum.  GENITOURINARY: No urinary frequency, urgency, hesitancy or dysuria. MUSCULOSKELETAL: No joint or muscle pain, no back pain, no recent trauma. DERMATOLOGIC: No rash, no itching, no lesions. ENDOCRINE: No polyuria, polydipsia, no heat or cold intolerance. No recent change in weight. HEMATOLOGICAL: No anemia or easy bruising or bleeding. NEUROLOGIC: No headache, seizures, numbness, tingling or weakness. PSYCHIATRIC: No depression, no loss of interest in normal activity or change in sleep pattern.     Exam: chaperone present  BP 110/68   Ht 4\' 11"  (1.499 m)   Wt 150 lb (68 kg)   LMP 09/25/2015   BMI 30.30 kg/m   Body mass index is 30.3 kg/m.  General appearance : Well developed well nourished female. No acute distress HEENT: Eyes: no retinal hemorrhage or exudates,  Neck supple, trachea midline, no carotid bruits, no thyroidmegaly Lungs: Clear to auscultation, no rhonchi or wheezes, or rib retractions  Heart: Regular rate and rhythm, no murmurs or gallops Breast:Examined in sitting and supine position were symmetrical in appearance, no palpable masses or tenderness,  no skin retraction, no nipple inversion, no nipple discharge, no skin discoloration, no axillary or supraclavicular lymphadenopathy Abdomen: no palpable masses or tenderness, no rebound or guarding Extremities: no edema or skin discoloration or tenderness  Pelvic:  Bartholin, Urethra, Skene Glands: Within normal limits  Vagina: No gross lesions or discharge  Cervix: Absent  Uterus  absent  Adnexa  Without masses or tenderness  Anus and perineum  normal   Rectovaginal  normal sphincter tone without palpated masses or tenderness             Hemoccult not performed     Assessment/Plan:  46 y.o. 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 Denman George. 2017. Pap smear with HPV screening done today as recommended by oncologist. Patient will return to the office in the next few days in a fasting state for the following screening blood work: Compresses a metabolic panel, fasting lipid profile, and CBC. Patient will be started on Estrace 1 mg by mouth daily for vasomotor symptoms, vaginal dryness and decreased libido. Risk benefits and pros and cons were discussed. The women's health initiative study as well as most recent echo medications on hormone replacement therapy were discussed with the patient and literature information was provided in [place]. She was also encouraged to take calcium vitamin D and weightbearing exercises for osteoporosis prevention. I would recommend she have a baseline bone density study at age 65. Patient did receive the flu vaccine today. Of note is that she has had history vitamin D deficiency in the past we will check a vitamin D level today as well.   Terrance Mass MD, 10:57 AM 11/01/2016

## 2016-11-05 ENCOUNTER — Other Ambulatory Visit: Payer: BLUE CROSS/BLUE SHIELD

## 2016-11-05 LAB — CBC WITH DIFFERENTIAL/PLATELET
BASOS PCT: 0 %
Basophils Absolute: 0 cells/uL (ref 0–200)
EOS ABS: 120 {cells}/uL (ref 15–500)
Eosinophils Relative: 2 %
HEMATOCRIT: 36.8 % (ref 35.0–45.0)
Hemoglobin: 11.7 g/dL (ref 11.7–15.5)
Lymphocytes Relative: 45 %
Lymphs Abs: 2700 cells/uL (ref 850–3900)
MCH: 25.1 pg — ABNORMAL LOW (ref 27.0–33.0)
MCHC: 31.8 g/dL — ABNORMAL LOW (ref 32.0–36.0)
MCV: 79 fL — AB (ref 80.0–100.0)
MONO ABS: 420 {cells}/uL (ref 200–950)
MONOS PCT: 7 %
MPV: 9.9 fL (ref 7.5–12.5)
NEUTROS ABS: 2760 {cells}/uL (ref 1500–7800)
Neutrophils Relative %: 46 %
PLATELETS: 216 10*3/uL (ref 140–400)
RBC: 4.66 MIL/uL (ref 3.80–5.10)
RDW: 17 % — ABNORMAL HIGH (ref 11.0–15.0)
WBC: 6 10*3/uL (ref 3.8–10.8)

## 2016-11-05 LAB — COMPREHENSIVE METABOLIC PANEL
ALBUMIN: 4 g/dL (ref 3.6–5.1)
ALT: 20 U/L (ref 6–29)
AST: 17 U/L (ref 10–35)
Alkaline Phosphatase: 115 U/L (ref 33–115)
BUN: 17 mg/dL (ref 7–25)
CHLORIDE: 105 mmol/L (ref 98–110)
CO2: 28 mmol/L (ref 20–31)
CREATININE: 0.69 mg/dL (ref 0.50–1.10)
Calcium: 9.1 mg/dL (ref 8.6–10.2)
Glucose, Bld: 90 mg/dL (ref 65–99)
POTASSIUM: 4.1 mmol/L (ref 3.5–5.3)
SODIUM: 140 mmol/L (ref 135–146)
Total Bilirubin: 0.4 mg/dL (ref 0.2–1.2)
Total Protein: 7.4 g/dL (ref 6.1–8.1)

## 2016-11-05 LAB — LIPID PANEL
CHOL/HDL RATIO: 3.1 ratio (ref <5.0)
Cholesterol: 159 mg/dL (ref <200)
HDL: 52 mg/dL (ref >50)
LDL Cholesterol: 93 mg/dL (ref <100)
Triglycerides: 72 mg/dL (ref <150)
VLDL: 14 mg/dL (ref <30)

## 2016-11-06 LAB — VITAMIN D 25 HYDROXY (VIT D DEFICIENCY, FRACTURES): Vit D, 25-Hydroxy: 33 ng/mL (ref 30–100)

## 2016-11-11 LAB — PAP, TP IMAGING W/ HPV RNA, RFLX HPV TYPE 16,18/45: HPV mRNA, High Risk: NOT DETECTED

## 2016-11-13 ENCOUNTER — Encounter (HOSPITAL_COMMUNITY): Payer: Self-pay | Admitting: Family Medicine

## 2016-11-13 ENCOUNTER — Ambulatory Visit (HOSPITAL_COMMUNITY)
Admission: EM | Admit: 2016-11-13 | Discharge: 2016-11-13 | Disposition: A | Discharge status: Home / Self Care | Payer: BLUE CROSS/BLUE SHIELD | Attending: Family Medicine | Admitting: Family Medicine

## 2016-11-13 DIAGNOSIS — L03811 Cellulitis of head [any part, except face]: Secondary | ICD-10-CM | POA: Diagnosis not present

## 2016-11-13 MED ORDER — CEPHALEXIN 500 MG PO CAPS: 500.0000 mg | ORAL_CAPSULE | Freq: Four times a day (QID) | ORAL | 0 refills | Status: DC

## 2016-11-13 NOTE — ED Provider Notes (Signed)
Krista Cross  CSN: [id number] Arrival date & time: 11/13/16  1918  History   Chief Complaint Chief Complaint  Patient presents with  . Wound Check   HPI Krista Cross is a 46 y.o. female presenting for wound on the right scalp.   She reports 3 weeks of mild pain associated with a small area behind the right ear just within the hairline which has gotten larger. She has been evaluated by urgent care, prescribed amoxicillin for cellulitis which she took for a few days before getting diffuse itchy rash which subsided when she stopped the medication. She denies drainage since that time but the area began getting more red/tender. It is not bleeding. No ear pain, fullness, hearing changes, fever, neck pain.   HPI  Past Medical History:  Diagnosis Date  . Cervical adenocarcinoma ([place])   . History of abnormal cervical Pap smear    w/ cryoablation in 2000  . Varicose veins     Patient Active Problem List   Diagnosis Date Noted  . New onset of headaches 05/22/2016  . 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 Cervical cancer ([place]) 10/17/2015  . Cervical cancer, FIGO stage IA2 ([place]) 08/18/2015  . AGUS favor benign 07/24/2015  . Vitamin D deficiency 07/06/2015  . Atypical glandular cells of undetermined significance (AGUS) on cervical Pap smear 07/06/2015  . Cervical cancer screening 02/27/2015  . Varicose veins of lower extremities with other complications 44/10/4740  . Routine Krista Cross medical examination at a health care facility 12/07/2013  . TINEA CORPORIS 10/27/2009  . SPRAIN&STRAIN UNSPEC SITE SHOULDER&UPPER ARM 10/27/2009  . MICROSCOPIC HEMATURIA 09/19/2009    Past Surgical History:  Procedure Laterality Date  . CERVICAL CONIZATION W/BX N/A 08/31/2015   Procedure: CONIZATION CERVIX WITH BIOPSY;  Surgeon: Everitt Amber, MD;  Location: [facility];  Service: Gynecology;  Laterality: N/A;  . CESAREAN SECTION  07-26-2003  &  12-28-2007   Bilateral Tubal  Ligation with last one  . ENDOVENOUS ABLATION SAPHENOUS VEIN W/ LASER Right 05-05-2014   EVLA RIGHT GREATER SAPHENOUS VEIN  BY TODD EARLY MD  . ROBOTIC ASSISTED TOTAL HYSTERECTOMY WITH BILATERAL SALPINGO OOPHERECTOMY Bilateral 10/17/2015   Procedure: XI ROBOTIC ASSISTED TYPE III RADICAL TOTAL HYSTERECTOMY WITH BILATERAL SALPINGECTOMY OOPHORECTOMY WITH SENTINEL LYMPH NODE BIOPSY;  Surgeon: Everitt Amber, MD;  Location: [facility];  Service: Gynecology;  Laterality: Bilateral;    OB History    Gravida Para Term Preterm AB Living   5 4     1 4    SAB TAB Ectopic Multiple Live Births   1               Home Medications    Prior to Admission medications   Medication Sig Start Date End Date Taking? Authorizing Provider  cephALEXin (KEFLEX) 500 MG capsule Take 1 capsule (500 mg total) by mouth 4 (four) times daily. 11/13/16   [name], MD  Cholecalciferol (VITAMIN D) 2000 UNITS tablet Take 2,000 Units by mouth daily.    Historical Provider, MD  estradiol (ESTRACE) 1 MG tablet Take 1 tablet (1 mg total) by mouth daily. 11/01/16   Terrance Mass, MD    Family History Family History  Problem Relation Age of Onset  . Hyperlipidemia Mother   . Diabetes Mother   . Varicose Veins Sister     Social History Social History  Substance Use Topics  . Smoking status: Never Smoker  . Smokeless tobacco: Never Used  . Alcohol use No  Allergies   Ibuprofen   Review of Systems Review of Systems Per HPI  Physical Exam Triage Vital Signs ED Triage Vitals [11/13/16 2025]  Enc Vitals Group     BP 100/56     Pulse Rate 79     Resp 18     Temp 98.5 F (36.9 C)     Temp src      SpO2 100 %     Weight      Height      Head Circumference      Peak Flow      Pain Score      Pain Loc      Pain Edu?      Excl. in [place]?    Physical Exam BP 100/56   Pulse 79   Temp 98.5 F (36.9 C)   Resp 18   LMP 09/25/2015   SpO2 100%  Gen: Well-appearing 46 y.o.female in NAD Skin: Just inside the  hairline of the right postauricular area is a 1.5cm diameter annular erythematous plaque with scale with shallow fissures extending craniocaudally without active drainage. Some periwound erythema is noted.   UC Treatments / Results  Labs (all labs ordered are listed, but only abnormal results are displayed) Labs Reviewed - No data to display  EKG  EKG Interpretation None       Radiology No results found.  Procedures Procedures (including critical care time)  Medications Ordered in UC Medications - No data to display   Initial Impression / Assessment and Plan / UC Course  I have reviewed the triage vital signs and the nursing notes.  Pertinent labs & imaging results that were available during my care of the patient were reviewed by me and considered in my medical decision making (see chart for details).  46 y.o. female presenting for wound with mild cellulitis with insensitivity to amoxicillin. Unusual morphology consistent with scabbing vs. psoriasis.  - Keflex to complete treatment of cellulitis. No abscess.  - Refer to dermatology for follow up. If fails to resolve, would have low threshold for biopsy and consideration for topical steroid once infection is resolved.   Final Clinical Impressions(s) / UC Diagnoses   Final diagnoses:  Cellulitis of head except face    New Prescriptions Discharge Medication List as of 11/13/2016  9:20 PM    START taking these medications   Details  cephALEXin (KEFLEX) 500 MG capsule Take 1 capsule (500 mg total) by mouth 4 (four) times daily., Starting Wed 11/13/2016, Normal         [name], MD 11/13/16 2153

## 2016-11-13 NOTE — Discharge Instructions (Signed)
Take the antibiotic 4 times daily as directed. If you have any new rash, facial swelling, or difficulty breathing seek medical attention.   Call [facility] first thing in the morning for an appointment.   If the area gets more painful, more red, or you notice a fever, seek medical attention.

## 2016-11-13 NOTE — ED Triage Notes (Signed)
Pt here for wound to scalp area. sts she was seen and treated and given abx but not better. sts iching.

## 2017-01-15 ENCOUNTER — Encounter: Payer: Self-pay | Admitting: Gynecology

## 2017-02-10 ENCOUNTER — Encounter: Payer: Self-pay | Admitting: Gynecologic Oncology

## 2017-02-10 ENCOUNTER — Ambulatory Visit: Payer: BLUE CROSS/BLUE SHIELD | Attending: Gynecologic Oncology | Admitting: Gynecologic Oncology

## 2017-02-10 VITALS — BP 103/44 | HR 80 | Temp 98.4°F | Resp 20 | Wt 149.0 lb

## 2017-02-10 DIAGNOSIS — E8941 Symptomatic postprocedural ovarian failure: Secondary | ICD-10-CM | POA: Diagnosis not present

## 2017-02-10 DIAGNOSIS — Z90722 Acquired absence of ovaries, bilateral: Secondary | ICD-10-CM | POA: Diagnosis not present

## 2017-02-10 DIAGNOSIS — Z8541 Personal history of malignant neoplasm of cervix uteri: Secondary | ICD-10-CM

## 2017-02-10 DIAGNOSIS — Z888 Allergy status to other drugs, medicaments and biological substances status: Secondary | ICD-10-CM | POA: Insufficient documentation

## 2017-02-10 DIAGNOSIS — C539 Malignant neoplasm of cervix uteri, unspecified: Secondary | ICD-10-CM

## 2017-02-10 DIAGNOSIS — Z90710 Acquired absence of both cervix and uterus: Secondary | ICD-10-CM | POA: Diagnosis not present

## 2017-02-10 DIAGNOSIS — Z833 Family history of diabetes mellitus: Secondary | ICD-10-CM | POA: Insufficient documentation

## 2017-02-10 DIAGNOSIS — R51 Headache: Secondary | ICD-10-CM

## 2017-02-10 DIAGNOSIS — Z08 Encounter for follow-up examination after completed treatment for malignant neoplasm: Secondary | ICD-10-CM | POA: Insufficient documentation

## 2017-02-10 NOTE — Progress Notes (Signed)
Follow Up Note: Gyn-Onc  Krista Cross 46 y.o. female  CC:  Chief Complaint  Patient presents with  . Cervical Cancer   Assessment/Plan:  46 year old female with a history of stage IB1 adenocarcinoma of the cervix, s/pvrobotic-assisted type III radical laparoscopic hysterectomy with bilateral salpingoophorectomy and bilateral pelvic lymphadenectomy on 10/17/15.   Low risk factors therefore no adjuvant therapy prescribed postop.  No evidence of disease on today's exam.  I discussed the risk for recurrence and typical symptoms that are manifested with this. I informed the patient to notify us if these develop and see [place] prior to her scheduled appointment.  Prescribed oral premarin for surgical menopause. However patient declines to take due to concern for cancer risk.  Follow-up: 6 monthly exams. Annual pap with HPV to screen for new HPV related dysplasia (not a screening tool for recurrence of her cervical cancer).  She will follow-up with me in 6 months. Next pap due in February/March 2019.  HPI: Krista Cross is a 46 year old female initially seen in consultation at the request of Dr Toney Rakes for villoglandular adenocarcinoma of the endocervix . The patient had an AGUS pap smear in June, 2016 with high risk HPV detected. Colposcopy and directed biopsies were performed on 08/03/15 which revealed a normal appearing cervix. The endometrium was sampled and showed proliferative endometrium with simple hyperplasia (no atypia), and the endocervical biopsy revealed adenocarcinoma with villoglandular endometrioid features.   On 08/31/15, she underwent a cold knife conization of the cervix.  Final pathology revealed:1. Cervix, cone ADENOCARCINOMA IN SITU, ENDOMETRIOID TYPE WITH SUPERFICIAL INVASION THE CARCINOMA IS1.3 CM IN LENGTH, 0.6 CM IN THICKNESS.  THE ENDOCERVICAL RESECTION MARGIN IS POSITIVE FROM 12 TO 6 O'CLOCK.  NO LYMPHOVASCULAR INVASION IDENTIFIED 2. Endocervix, curettage,  post cone:  DETACHED SMALL FRAGMENT OF ADENOCARCINOMA   Based on the cone findings, it was recommended she proceed with a radical hysterectomy.  On 10/17/15, she underwent a Robotic-assisted type III radical laparoscopic hysterectomy with bilateral salpingoophorectomy and bilateral pelvic lymphadenectomy.  Final pathology revealed:  Diagnosis 1. Lymph node, sentinel, biopsy, right external iliac - ONE BENIGN LYMPH NODE WITH NO TUMOR SEEN (0/1). 2. Lymph node, sentinel, biopsy, right external iliac - ONE BENIGN LYMPH NODE WITH NO TUMOR SEEN (0/1). 3. Lymph node, sentinel, biopsy, right obturator - ONE BENIGN LYMPH NODE WITH NO TUMOR SEEN (0/1). 4. Lymph nodes, regional resection, left pelvic - EIGHT BENIGN LYMPH NODES WITH NO TUMOR SEEN (0/8). 5. Uterus +/- tubes/ovaries, neoplastic - CERVIX WITH TWO RESIDUAL FOCI OF ADENOCARCINOMA IN SITU, EACH MEASURING LESS THAN 0.2 CM IN GREATEST DIMENSION. - CERVIX ALSO DEMONSTRATES FOCAL HIGH GRADE SQUAMOUS INTRAEPITHELIAL LESION, CIN-II (MODERATE DYSPLASIA). - ECTOCERVICAL MARGIN IS NEGATIVE FOR DYSPLASIA, ATYPIA OR MALIGNANCY. - SEE COMMENT. ADDITIONAL FINDINGS: - SECRETORY PATTERN ENDOMETRIUM WITH UNDERLYING BENIGN MYOMETRIUM. - BENIGN FALLOPIAN TUBES WITH EVIDENCE OF TUBAL LIGATION AND BENIGN PARATUBAL CYST FORMATION ON THE LEFT. - BENIGN BILATERAL OVARIES WITH HEMORRHAGIC CORPUS LUTEUM FORMATION ON THE LEFT. - ASIDE FROM THE FINDINGS IN THE CERVIX, THERE IS NO ATYPIA OR MALIGNANCY PRESENT IN THE REMAINDER OF THE SPECIMEN.   Interval History:  She presents today with her interpreter for routine follow-up.   Since last visit she is reporting no new symptoms.  Last pap in March 2018 was normal with negative high risk HPV.  Review of Systems Constitutional:+ hot flashes. Cardiovascular: No chest pain, shortness of breath, or edema.  Pulmonary: No cough or wheeze.  Gastrointestinal: No nausea, vomiting, or diarrhea.  No bright red blood per  rectum or change in bowel movement.  Genitourinary: No frequency, urgency, or dysuria. No vaginal bleeding or discharge.  Musculoskeletal: No myalgia or joint pain. Neurologic: + headache  Psychology: No depression, anxiety, or insomnia.  Current Meds:  Outpatient Encounter Prescriptions as of 02/10/2017  Medication Sig  . Cholecalciferol (VITAMIN D) 2000 UNITS tablet Take 2,000 Units by mouth daily.  . [DISCONTINUED] cephALEXin (KEFLEX) 500 MG capsule Take 1 capsule (500 mg total) by mouth 4 (four) times daily.  . [DISCONTINUED] estradiol (ESTRACE) 1 MG tablet Take 1 tablet (1 mg total) by mouth daily.   No facility-administered encounter medications on file as of 02/10/2017.     Allergy:  Allergies  Allergen Reactions  . Ibuprofen Swelling    Social Hx:   Social History   Social History  . Marital status: Married    Spouse name: N/A  . Number of children: N/A  . Years of education: N/A   Occupational History  . Not on file.   Social History Main Topics  . Smoking status: Never Smoker  . Smokeless tobacco: Never Used  . Alcohol use No  . Drug use: No  . Sexual activity: Yes    Birth control/ protection: Surgical   Other Topics Concern  . Not on file   Social History Narrative  . No narrative on file    Past Surgical Hx:  Past Surgical History:  Procedure Laterality Date  . CERVICAL CONIZATION W/BX N/A 08/31/2015   Procedure: CONIZATION CERVIX WITH BIOPSY;  Surgeon: Everitt Amber, MD;  Location: [facility];  Service: Gynecology;  Laterality: N/A;  . CESAREAN SECTION  07-26-2003  &  12-28-2007   Bilateral Tubal Ligation with last one  . ENDOVENOUS ABLATION SAPHENOUS VEIN W/ LASER Right 05-05-2014   EVLA RIGHT GREATER SAPHENOUS VEIN  BY TODD EARLY MD  . ROBOTIC ASSISTED TOTAL HYSTERECTOMY WITH BILATERAL SALPINGO OOPHERECTOMY Bilateral 10/17/2015   Procedure: XI ROBOTIC ASSISTED TYPE III RADICAL TOTAL HYSTERECTOMY WITH BILATERAL SALPINGECTOMY  OOPHORECTOMY WITH SENTINEL LYMPH NODE BIOPSY;  Surgeon: Everitt Amber, MD;  Location: [facility];  Service: Gynecology;  Laterality: Bilateral;    Past Medical Hx:  Past Medical History:  Diagnosis Date  . Cervical adenocarcinoma ([place])   . History of abnormal cervical Pap smear    w/ cryoablation in 2000  . Varicose veins     Family Hx:  Family History  Problem Relation Age of Onset  . Hyperlipidemia Mother   . Diabetes Mother   . Varicose Veins Sister     Vitals:  Blood pressure (!) 103/44, pulse 80, temperature 98.4 F (36.9 C), temperature source Oral, resp. rate 20, weight 149 lb (67.6 kg), last menstrual period 09/25/2015.  Physical Exam:  General: Well developed, well nourished female in no acute distress. Alert and oriented x 3.  Neck: Supple without any enlargements.  Lymph node survey: No cervical, supraclavicular, or inguinal adenopathy.  Cardiovascular: Regular rate and rhythm. S1 and S2 normal.  Lungs: Clear to auscultation bilaterally. No wheezes/crackles/rhonchi noted.  Skin: No rashes or lesions present. Back: No CVA tenderness.  Abdomen: Abdomen soft, non-tender and obese. Active bowel sounds in all quadrants. No evidence of a fluid wave or abdominal masses. Incisions healed Genito-urinary: normal EFG. Normal vagina. Vaginal cuff intact and healed. No lesions. No blood. Extremities: No bilateral cyanosis, edema, or clubbing.   Donaciano Eva, MD 02/10/2017, 4:25 PM

## 2017-02-10 NOTE — Patient Instructions (Signed)
Follow up with Dr. Denman George in December 2018 as scheduled.

## 2017-06-06 ENCOUNTER — Encounter: Payer: Self-pay | Admitting: Family Medicine

## 2017-06-06 ENCOUNTER — Ambulatory Visit (INDEPENDENT_AMBULATORY_CARE_PROVIDER_SITE_OTHER): Payer: BLUE CROSS/BLUE SHIELD | Admitting: Family Medicine

## 2017-06-06 VITALS — BP 118/70 | HR 80 | Resp 12 | Ht 59.0 in | Wt 148.0 lb

## 2017-06-06 DIAGNOSIS — I83893 Varicose veins of bilateral lower extremities with other complications: Secondary | ICD-10-CM

## 2017-06-06 DIAGNOSIS — Z0001 Encounter for general adult medical examination with abnormal findings: Secondary | ICD-10-CM | POA: Diagnosis not present

## 2017-06-06 DIAGNOSIS — E8940 Asymptomatic postprocedural ovarian failure: Secondary | ICD-10-CM | POA: Diagnosis not present

## 2017-06-06 DIAGNOSIS — J351 Hypertrophy of tonsils: Secondary | ICD-10-CM | POA: Diagnosis not present

## 2017-06-06 DIAGNOSIS — Z23 Encounter for immunization: Secondary | ICD-10-CM | POA: Diagnosis not present

## 2017-06-06 DIAGNOSIS — Z0000 Encounter for general adult medical examination without abnormal findings: Secondary | ICD-10-CM

## 2017-06-06 LAB — POCT GLYCOSYLATED HEMOGLOBIN (HGB A1C): HEMOGLOBIN A1C: 5.3

## 2017-06-06 NOTE — Patient Instructions (Addendum)
A few things to remember from today's visit:   Routine general medical examination at a health care facility  Need for influenza vaccination - Plan: Flu Vaccine QUAD 36+ mos IM   Please be sure medication list is accurate. If a new problem present, please set up appointment sooner than planned today.          Krista Cross (Health Maintenance, Female) Un estilo de vida saludable y los cuidados preventivos pueden favorecer considerablemente a la salud y Musician. Pregunte a su mdico cul es el cronograma de exmenes peridicos apropiado para usted. Esta es una buena oportunidad para consultarlo sobre cmo prevenir enfermedades y [place] sano. Adems de los controles, hay muchas otras cosas que puede hacer usted mismo. Los expertos han realizado numerosas investigaciones [employer] cambios en el estilo de vida y las medidas de prevencin que, [place], lo ayudarn a mantenerse sano. Solicite a su mdico ms informacin. EL PESO Y LA DIETA Consuma una dieta saludable.  Asegrese de [employer] verduras, frutas, productos lcteos de bajo contenido de [place] y Advertising account planner.  No consuma muchos alimentos de alto contenido de grasas slidas, azcares agregados o sal.  Realice actividad fsica con regularidad. Esta es una de las prcticas ms importantes que puede hacer por su salud. ? La Delorise Shiner de los adultos deben hacer ejercicio durante al menos 14mnutos por semana. El ejercicio debe aumentar la frecuencia cardaca y pActorla transpiracin (ejercicio de iOlga. ? La mayora de los adultos tambin deben hacer ejercicios de elongacin al mToysRusveces a la semana. Agregue esto al su plan de ejercicio de intensidad moderada. Mantenga un peso saludable.  El ndice de masa corporal ([facility] es una medida que puede utilizarse para identificar posibles problemas de pMaunawili Proporciona una estimacin de la grasa corporal basndose en  el peso y la altura. Su mdico puede ayudarle a dRadiation protection practitionerIAlliancey a lScientist, forensico mTheatre managerun peso saludable.  Para las mujeres de 20aos o ms: ? Un IComprehensive Surgery Center LLCmenor de 18,5 se considera bajo peso. ? Un ICopley Memorial Hospital Inc Dba Rush Copley Medical Centerentre 18,5 y 24,9 es normal. ? Un IWalker Surgical Center LLCentre 25 y 29,9 se considera sobrepeso. ? Un IMC de 30 o ms se considera obesidad. Observe los niveles de colesterol y lpidos en la sangre.  Debe comenzar a rEnglish as a second language teacherde lpidos y cResearch officer, trade unionen la sangre a los 20aos y luego repetirlos cada 551aos  Es posible que nAutomotive engineerlos niveles de colesterol con mayor frecuencia si: ? Sus niveles de lpidos y colesterol son altos. ? Es mayor de [id number] ? Presenta un alto riesgo de padecer enfermedades cardacas. DETECCIN DE CNCER Cncer de pulmn  Se recomienda realizar exmenes de deteccin de cncer de pulmn a personas adultas entre 586y 872aos que estn en riesgo de dHorticulturist, commercialde pulmn por sus antecedentes de consumo de tabaco.  Se recomienda una tomografa computarizada de baja dosis de los pulmones todos los aos a las personas que: ? Fuman actualmente. ? Hayan dejado el hbito en algn momento en los ltimos 15aos. ? Hayan fumado durante 30aos un paquete diario. Un paquete-ao equivale a fumar un promedio de un paquete de cigarrillos diario durante un ao.  Los exmenes de deteccin anuales deben continuar hasta que hayan pasado 15aos desde que dej de fumar.  Ya no debern realizarse si tiene un problema de salud que le impida recibir tratamiento para eScience writerde pulmn. Cncer de mama  Practique la autoconciencia de la  mama. Esto significa reconocer la apariencia normal de sus mamas y cmo las siente.  Tambin significa realizar autoexmenes regulares de [employer]. Informe a su mdico sobre cualquier cambio, sin importar cun pequeo sea.  Si tiene entre 20 y 45 aos, un mdico debe realizarle un examen clnico de las mamas como parte del examen regular de  [place], cada 1 a 3aos.  Si tiene 40aos o ms, debe Information systems manager clnico de las Microsoft. Tambin considere realizarse una [place] todos los [place].  Si tiene antecedentes familiares de cncer de mama, hable con su mdico para someterse a un estudio gentico.  Si tiene alto riesgo de Chief Financial Officer de mama, hable con su mdico para someterse a Public house manager y [employer].  La evaluacin del gen del cncer de mama (BRCA) se recomienda a mujeres que tengan familiares con cnceres relacionados con el BRCA. Los cnceres relacionados con el BRCA incluyen los siguientes: ? [place]. ? Ovario. ? [place]. ? Cnceres de peritoneo.  Los resultados de la evaluacin determinarn la necesidad de asesoramiento gentico y de [place] de BRCA1 y BRCA2.  Cncer colorrectal  Este tipo de cncer puede detectarse y a menudo prevenirse.  Por lo general, los estudios de rutina se deben Medical laboratory scientific officer a Field seismologist a Proofreader de los 31 aos y [place] 30 aos.  Sin embargo, el mdico podr aconsejarle que lo haga antes, si tiene factores de riesgo para el cncer de colon.  Tambin puede recomendarle que use un kit de prueba para Hydrologist en la materia fecal.  Es posible que se use una pequea cmara en el extremo de un tubo para examinar directamente el colon (sigmoidoscopia o colonoscopia) a fin de Hydrographic surveyor formas tempranas de cncer colorrectal.  Los exmenes de rutina generalmente comienzan a los 92aos.  El examen directo del colon se debe repetir cada 5 a 10aos hasta los 75aos. Sin embargo, es posible que se realicen exmenes con mayor frecuencia, si se detectan formas tempranas de plipos precancerosos o pequeos bultos. Cncer de piel  Revise la piel de la cabeza a los pies con regularidad.  Informe a su mdico si aparecen nuevos lunares o los que tiene se modifican, especialmente en su forma y color.  Tambin  notifique al mdico si tiene un lunar que es ms grande que el tamao de una goma de lpiz.  Siempre use pantalla solar. Aplique pantalla solar de [place] D[place] y repetida a lo largo del Training and development officer.  Protjase usando mangas y [employer], un sombrero de ala ancha y gafas para el sol, siempre que se encuentre en el exterior. ENFERMEDADES CARDACAS, DIABETES E HIPERTENSIN ARTERIAL  La hipertensin arterial causa enfermedades cardacas y [place] el riesgo de ictus. La hipertensin arterial es ms probable en los siguientes casos: ? Las personas que tienen la presin arterial en el extremo del rango normal (100-139/85-89 mm Hg). ? [employer] con sobrepeso u obesidad. ? Scientist, water quality.  Si usted tiene entre 18 y 39 aos, debe medirse la presin arterial cada 3 a 5 aos. Si usted tiene 40 aos o ms, debe medirse la presin arterial [employer]. Debe medirse la presin arterial dos veces: una vez cuando est en un hospital o una clnica y la otra vez cuando est en otro sitio. Registre el promedio de [employer]. Para controlar su presin arterial cuando no est en un hospital o [place], puede usar lo siguiente: ?  Una mquina automtica para medir la presin arterial en una farmacia. ? Un monitor para medir la presin arterial en el hogar.  Si tiene entre 39 y 60 aos, consulte a su mdico si debe tomar aspirina para prevenir el ictus.  Realcese exmenes de deteccin de la diabetes con regularidad. Esto incluye la toma de [place] de sangre para controlar el nivel de azcar en la sangre durante el [place]. ? Si tiene un peso normal y un bajo riesgo de padecer diabetes, realcese este anlisis cada tres aos despus de los 45aos. ? Si tiene sobrepeso y un alto riesgo de padecer diabetes, considere someterse a este anlisis antes o con mayor frecuencia. PREVENCIN DE INFECCIONES HepatitisB  Si tiene un riesgo ms alto de Museum/gallery curator hepatitis B, debe someterse a un  examen de deteccin de este virus. Se considera que tiene un alto riesgo de contraer hepatitis B si: ? Naci en un pas donde la hepatitis B es frecuente. Pregntele a su mdico qu pases son considerados de Public affairs consultant. ? Sus padres nacieron en un pas de alto riesgo y usted no recibi una vacuna que lo proteja contra la hepatitis B (vacuna contra la hepatitis B). ? [place]. ? [place] agujas para inyectarse drogas. ? Vive con alguien que tiene hepatitis B. ? Ha tenido sexo con alguien que tiene hepatitis B. ? Recibe tratamiento de hemodilisis. ? Toma ciertos medicamentos para el cncer, trasplante de rganos y afecciones autoinmunitarias. Hepatitis C  Se recomienda un anlisis de [place] para: ? [employer] 1945 y 1965. ? Todas las personas que tengan un riesgo de haber contrado hepatitis C. Enfermedades de transmisin sexual (ETS).  Debe realizarse pruebas de deteccin de enfermedades de transmisin sexual (ETS), incluidas gonorrea y clamidia si: ? Es sexualmente activo y es menor de 16RCV. ? Es mayor de 24aos, y Investment banker, operational informa que corre riesgo de tener este tipo de infecciones. ? La actividad sexual ha cambiado desde que le hicieron la ltima prueba de deteccin y tiene un riesgo mayor de Best boy clamidia o Radio broadcast assistant. Pregntele al mdico si usted tiene riesgo.  Si no tiene el VIH, pero corre riesgo de infectarse por el virus, se recomienda tomar diariamente un medicamento recetado para evitar la infeccin. Esto se conoce como profilaxis previa a la exposicin. Se considera que est en riesgo si: ? Es [place] sexualmente y no [place] preservativos habitualmente o no conoce el estado del VIH de sus Advertising copywriter. ? Se inyecta drogas. ? Es [place] sexualmente con Ardelia Mems pareja que tiene VIH. Consulte a su mdico para saber si tiene un alto riesgo de infectarse por el VIH. Si opta por comenzar la profilaxis previa a la exposicin, primero debe realizarse anlisis de  deteccin del VIH. Luego, le harn anlisis cada 59mses mientras est tomando los medicamentos para la profilaxis previa a la exposicin.  OSTEOPOROSIS Y MENOPAUSIA  La osteoporosis es una enfermedad en la que los huesos pierden los minerales y la fuerza por el avance de la edad. El resultado pueden ser fracturas graves en los h[place] El riesgo de osteoporosis puede identificarse con uArdelia Memsprueba de densidad sea.  Si tiene 65aos o ms, o si est en riesgo de sufrir osteoporosis y fracturas, pregunte a su mdico si debe someterse a exmenes.  Consulte a su mdico si debe tomar un suplemento de calcio o de vitamina D para reducir el riesgo de osteoporosis.  La menopausia puede presentar ciertos sntomas fsicos y rGaffer  La terapia de reemplazo hormonal puede reducir algunos de estos sntomas y Gaffer. Consulte a su mdico para saber si la terapia de reemplazo hormonal es conveniente para usted. INSTRUCCIONES PARA EL CUIDADO EN EL HOGAR  Realcese los estudios de rutina de la salud, dentales y de Public librarian.  [place].  No consuma ningn producto que contenga tabaco, lo que incluye cigarrillos, tabaco de Higher education careers adviser o Psychologist, sport and exercise.  Si est embarazada, no beba alcohol.  Si est amamantando, reduzca el consumo de alcohol y la frecuencia con la que consume.  Si es mujer y no est embarazada limite el consumo de alcohol a no ms de 1 medida por da. Una medida equivale a 12onzas de cerveza, 5onzas de vino o 1onzas de bebidas alcohlicas de alta graduacin.  No consuma drogas.  No comparta agujas.  Solicite ayuda a su mdico si necesita apoyo o informacin para abandonar las drogas.  Informe a su mdico si a menudo se siente deprimido.  Notifique a su mdico si alguna vez ha sido vctima de abuso o si no se siente seguro en su hogar. Esta informacin no tiene Marine scientist el consejo del mdico. Asegrese de hacerle al mdico cualquier  pregunta que tenga. Document Released: 08/08/2011 Document Revised: 09/09/2014 Document Reviewed: 05/23/2015 Elsevier Interactive Patient Education  [employer].

## 2017-06-06 NOTE — Progress Notes (Signed)
HPI:   Krista Cross is a 46 y.o. female, who is here today to establish care.  Former PCP: Dr Birdie Riddle Last preventive routine visit: 2016. She follows with gyn regularly, Dr Denman George.  She has a Patent attorney with her today, even though I do not need a translator,she asked for her to stay.  Chronic medical problems: Cervical cancer, status post hysterectomy and oophorectomy. She is not on hormonal replacement because she is afraid of side effects, in particular breast cancer.   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 She has had treatment for varicose pain in right lower extremity but did not help much,so she did not do it on LLE. She denies LE edema or erythema. She doesn't wear compression stockings.  Vit D deficiency: In 10/2016 her 25 OH vit D was 33. She takes OTC Vit D OTC 2000 U   Concerns today: She would like to have her CPE today. Last gynecologic examination in March 2018.  She exercises regularly, she walks and does Zumba. She also tries to follow a healthy diet. She lives with her husband and their 2 children.   Lab Results  Component Value Date   CHOL 159 11/05/2016   HDL 52 11/05/2016   LDLCALC 93 11/05/2016   TRIG 72 11/05/2016   CHOLHDL 3.1 11/05/2016      Review of Systems  Constitutional: Negative for activity change, appetite change, fatigue and fever.  HENT: Negative for dental problem, hearing loss, mouth sores, sore throat, trouble swallowing and voice change.   Eyes: Negative for redness and visual disturbance.  Respiratory: Negative for cough, shortness of breath and wheezing.   Cardiovascular: Negative for chest pain, palpitations and leg swelling.  Gastrointestinal: Negative for abdominal pain, blood in stool, nausea and vomiting.       No changes in bowel habits.  Endocrine: Negative for cold intolerance, heat intolerance, polydipsia, polyphagia and  polyuria.  Genitourinary: Negative for decreased urine volume, dysuria and hematuria.  Musculoskeletal: Positive for myalgias (LE, bilateral). Negative for back pain, gait problem and neck pain.  Skin: Negative for color change and rash.  Allergic/Immunologic: Negative for environmental allergies.  Neurological: Negative for syncope, weakness, numbness and headaches.  Hematological: Negative for adenopathy. Does not bruise/bleed easily.  Psychiatric/Behavioral: Negative for confusion and sleep disturbance. The patient is not nervous/anxious.   All other systems reviewed and are negative.     Current Outpatient Prescriptions on File Prior to Visit  Medication Sig Dispense Refill  . Cholecalciferol (VITAMIN D) 2000 UNITS tablet Take 2,000 Units by mouth daily.     No current facility-administered medications on file prior to visit.      Past Medical History:  Diagnosis Date  . Cervical adenocarcinoma ([place])   . History of abnormal cervical Pap smear    w/ cryoablation in 2000  . Varicose veins    Past Surgical History:  Procedure Laterality Date  . CERVICAL CONIZATION W/BX N/A 08/31/2015   Procedure: CONIZATION CERVIX WITH BIOPSY;  Surgeon: Everitt Amber, MD;  Location: [facility];  Service: Gynecology;  Laterality: N/A;  . CESAREAN SECTION  07-26-2003  &  12-28-2007   Bilateral Tubal Ligation with last one  . ENDOVENOUS ABLATION SAPHENOUS VEIN W/ LASER Right 05-05-2014   EVLA RIGHT GREATER SAPHENOUS VEIN  BY TODD EARLY MD  . ROBOTIC ASSISTED TOTAL HYSTERECTOMY WITH BILATERAL SALPINGO OOPHERECTOMY Bilateral 10/17/2015   Procedure: XI ROBOTIC ASSISTED TYPE  III RADICAL TOTAL HYSTERECTOMY WITH BILATERAL SALPINGECTOMY OOPHORECTOMY WITH SENTINEL LYMPH NODE BIOPSY;  Surgeon: Everitt Amber, MD;  Location: [facility];  Service: Gynecology;  Laterality: Bilateral;    Allergies  Allergen Reactions  . Ibuprofen Swelling    Family History  Problem Relation Age of Onset  .  Hyperlipidemia Mother   . Diabetes Mother   . Varicose Veins Sister   . Lupus Sister   . Cancer Neg Hx     Social History   Social History  . Marital status: Married    Spouse name: N/A  . Number of children: N/A  . Years of education: N/A   Social History Main Topics  . Smoking status: Never Smoker  . Smokeless tobacco: Never Used  . Alcohol use No  . Drug use: No  . Sexual activity: Yes    Birth control/ protection: Surgical   Other Topics Concern  . None   Social History Narrative  . None    Vitals:   06/06/17 1013  BP: 118/70  Pulse: 80  Resp: 12  SpO2: 98%    Body mass index is 29.89 kg/m.   Physical Exam  Nursing note and vitals reviewed. Constitutional: She is oriented to person, place, and time. She appears well-developed. No distress.  HENT:  Head: Normocephalic and atraumatic.  Right Ear: Hearing, tympanic membrane, external ear and ear canal normal.  Left Ear: Hearing, tympanic membrane, external ear and ear canal normal.  Mouth/Throat: Uvula is midline, oropharynx is clear and moist and mucous membranes are normal.  Right hypertrophic tonsil.  Eyes: Pupils are equal, round, and reactive to light. Conjunctivae and EOM are normal.  Neck: No tracheal deviation present. No thyromegaly (palpable) present.  Cardiovascular: Normal rate and regular rhythm.   No murmur heard. Pulses:      Dorsalis pedis pulses are 2+ on the right side, and 2+ on the left side.  Varicose veins LE, bilateral.  Respiratory: Effort normal and breath sounds normal. No respiratory distress.  GI: Soft. She exhibits no mass. There is no hepatomegaly. There is no tenderness.  Genitourinary:  Genitourinary Comments: Deferred to gyn.  Musculoskeletal: She exhibits no edema or tenderness.  No major deformity or signs of synovitis appreciated. No calves edema or erythema, no tenderness upon palpation.  Lymphadenopathy:    She has no cervical adenopathy.       Right: No  supraclavicular adenopathy present.       Left: No supraclavicular adenopathy present.  Neurological: She is alert and oriented to person, place, and time. She has normal strength. No cranial nerve deficit. Coordination and gait normal.  Reflex Scores:      Bicep reflexes are 2+ on the right side and 2+ on the left side.      Patellar reflexes are 2+ on the right side and 2+ on the left side. Skin: Skin is warm. No rash noted. No erythema.  Psychiatric: She has a normal mood and affect. Her speech is normal.  Well groomed, good eye contact.     ASSESSMENT AND PLAN:   Ms. Tomi was seen today for establish care and annual exam.  Diagnoses and all orders for this visit:  Lab Results  Component Value Date   HGBA1C 5.3 06/06/2017   Routine general medical examination at a health care facility   We discussed the importance of regular physical activity and healthy diet for prevention of chronic illness and/or complications. Preventive guidelines reviewed. Continue following with Dr Denman George for her female  preventive care. Vaccination up to date.  Ca++ and vit D supplementation recommended. Next CPE in 1-2 years.   -     POC HgB A1c  Need for influenza vaccination -     Flu Vaccine QUAD 36+ mos IM  Surgical menopause  We discussed symptoms, CV risk vs benefits, and side effects of hormonal therapy. She has discussed treatment with her gyn and she is not interested in taking hormonal therapy for now.  Varicose veins of bilateral lower extremities with other complications  Dx discussed. Compression stocking may help. LE elevation and avoid trigger factors also recommended.  Hypertrophy of tonsil-Right  Incidental finding, she is not having symptoms and thinks somebody has mentioned it before. Monitor it for now. Instructed about warning signs.     Betty G. Martinique, MD  [facility]. [place] office.

## 2017-08-18 ENCOUNTER — Encounter: Payer: Self-pay | Admitting: Gynecologic Oncology

## 2017-08-18 ENCOUNTER — Ambulatory Visit: Payer: BLUE CROSS/BLUE SHIELD | Attending: Gynecologic Oncology | Admitting: Gynecologic Oncology

## 2017-08-18 ENCOUNTER — Ambulatory Visit (HOSPITAL_BASED_OUTPATIENT_CLINIC_OR_DEPARTMENT_OTHER): Payer: BLUE CROSS/BLUE SHIELD

## 2017-08-18 ENCOUNTER — Other Ambulatory Visit: Payer: Self-pay | Admitting: *Deleted

## 2017-08-18 VITALS — BP 107/52 | HR 78 | Temp 98.3°F | Wt 146.9 lb

## 2017-08-18 DIAGNOSIS — M545 Low back pain: Secondary | ICD-10-CM | POA: Diagnosis not present

## 2017-08-18 DIAGNOSIS — Z90710 Acquired absence of both cervix and uterus: Secondary | ICD-10-CM | POA: Diagnosis not present

## 2017-08-18 DIAGNOSIS — R1031 Right lower quadrant pain: Secondary | ICD-10-CM

## 2017-08-18 DIAGNOSIS — Z886 Allergy status to analgesic agent status: Secondary | ICD-10-CM | POA: Diagnosis not present

## 2017-08-18 DIAGNOSIS — E8941 Symptomatic postprocedural ovarian failure: Secondary | ICD-10-CM | POA: Diagnosis not present

## 2017-08-18 DIAGNOSIS — Z08 Encounter for follow-up examination after completed treatment for malignant neoplasm: Secondary | ICD-10-CM | POA: Insufficient documentation

## 2017-08-18 DIAGNOSIS — C539 Malignant neoplasm of cervix uteri, unspecified: Secondary | ICD-10-CM

## 2017-08-18 DIAGNOSIS — M25551 Pain in right hip: Secondary | ICD-10-CM | POA: Diagnosis not present

## 2017-08-18 DIAGNOSIS — Z8541 Personal history of malignant neoplasm of cervix uteri: Secondary | ICD-10-CM | POA: Diagnosis not present

## 2017-08-18 DIAGNOSIS — Z8269 Family history of other diseases of the musculoskeletal system and connective tissue: Secondary | ICD-10-CM | POA: Diagnosis not present

## 2017-08-18 DIAGNOSIS — Z833 Family history of diabetes mellitus: Secondary | ICD-10-CM | POA: Diagnosis not present

## 2017-08-18 DIAGNOSIS — Z90722 Acquired absence of ovaries, bilateral: Secondary | ICD-10-CM | POA: Diagnosis not present

## 2017-08-18 LAB — URINALYSIS, MICROSCOPIC - CHCC
BILIRUBIN (URINE): NEGATIVE
Glucose: NEGATIVE mg/dL
Ketones: NEGATIVE mg/dL
NITRITE: NEGATIVE
Protein: NEGATIVE mg/dL
Specific Gravity, Urine: 1.01 (ref 1.003–1.035)
UROBILINOGEN UR: 0.2 mg/dL (ref 0.2–1)
pH: 6 (ref 4.6–8.0)

## 2017-08-18 NOTE — Progress Notes (Signed)
Follow Up Note: Gyn-Onc  Krista Cross 46 y.o. female  CC:  Chief Complaint  Patient presents with  . Cervical cancer, FIGO stage IA2 [facility])   Assessment/Plan:  46 year old female with a history of stage IB1 adenocarcinoma of the cervix, s/pvrobotic-assisted type III radical laparoscopic hysterectomy with bilateral salpingoophorectomy and bilateral pelvic lymphadenectomy on 10/17/15.   Low risk factors therefore no adjuvant therapy prescribed postop.  No evidence of disease on today's exam, though symptoms of back and hip pain - will evaluate with UA given relationiship to urination and with CT abdo/pelvis to rule out recurrence.  I discussed the risk for recurrence and typical symptoms that are manifested with this. I informed the patient to notify us if these develop and see [place] prior to her scheduled appointment.  Prescribed oral premarin for surgical menopause. However patient declines to take due to concern for cancer risk.  Follow-up: 6 monthly exams. Annual pap with HPV to screen for new HPV related dysplasia (not a screening tool for recurrence of her cervical cancer).  She will follow-up with me in 6 months. Next pap due in February/March 2019.  HPI: Krista Cross is a 46 year old female initially seen in consultation at the request of Dr Toney Rakes for villoglandular adenocarcinoma of the endocervix . The patient had an AGUS pap smear in June, 2016 with high risk HPV detected. Colposcopy and directed biopsies were performed on 08/03/15 which revealed a normal appearing cervix. The endometrium was sampled and showed proliferative endometrium with simple hyperplasia (no atypia), and the endocervical biopsy revealed adenocarcinoma with villoglandular endometrioid features.   On 08/31/15, she underwent a cold knife conization of the cervix.  Final pathology revealed:1. Cervix, cone ADENOCARCINOMA IN SITU, ENDOMETRIOID TYPE WITH SUPERFICIAL INVASION THE CARCINOMA IS1.3 CM IN  LENGTH, 0.6 CM IN THICKNESS.  THE ENDOCERVICAL RESECTION MARGIN IS POSITIVE FROM 12 TO 6 O'CLOCK.  NO LYMPHOVASCULAR INVASION IDENTIFIED 2. Endocervix, curettage, post cone:  DETACHED SMALL FRAGMENT OF ADENOCARCINOMA   Based on the cone findings, it was recommended she proceed with a radical hysterectomy.  On 10/17/15, she underwent a Robotic-assisted type III radical laparoscopic hysterectomy with bilateral salpingoophorectomy and bilateral pelvic lymphadenectomy.  Final pathology revealed:  Diagnosis 1. Lymph node, sentinel, biopsy, right external iliac - ONE BENIGN LYMPH NODE WITH NO TUMOR SEEN (0/1). 2. Lymph node, sentinel, biopsy, right external iliac - ONE BENIGN LYMPH NODE WITH NO TUMOR SEEN (0/1). 3. Lymph node, sentinel, biopsy, right obturator - ONE BENIGN LYMPH NODE WITH NO TUMOR SEEN (0/1). 4. Lymph nodes, regional resection, left pelvic - EIGHT BENIGN LYMPH NODES WITH NO TUMOR SEEN (0/8). 5. Uterus +/- tubes/ovaries, neoplastic - CERVIX WITH TWO RESIDUAL FOCI OF ADENOCARCINOMA IN SITU, EACH MEASURING LESS THAN 0.2 CM IN GREATEST DIMENSION. - CERVIX ALSO DEMONSTRATES FOCAL HIGH GRADE SQUAMOUS INTRAEPITHELIAL LESION, CIN-II (MODERATE DYSPLASIA). - ECTOCERVICAL MARGIN IS NEGATIVE FOR DYSPLASIA, ATYPIA OR MALIGNANCY. - SEE COMMENT. ADDITIONAL FINDINGS: - SECRETORY PATTERN ENDOMETRIUM WITH UNDERLYING BENIGN MYOMETRIUM. - BENIGN FALLOPIAN TUBES WITH EVIDENCE OF TUBAL LIGATION AND BENIGN PARATUBAL CYST FORMATION ON THE LEFT. - BENIGN BILATERAL OVARIES WITH HEMORRHAGIC CORPUS LUTEUM FORMATION ON THE LEFT. - ASIDE FROM THE FINDINGS IN THE CERVIX, THERE IS NO ATYPIA OR MALIGNANCY PRESENT IN THE REMAINDER OF THE SPECIMEN.   Interval History:  She presents today with her interpreter for routine follow-up.   Since last visit she is reporting no new symptoms.  Last pap in March 2018 was normal with negative high risk HPV.  Patient reports 4 weeks of low back pain and right hip pain.  Was treated empirically as UTI but pain persisted after antibiotics. The patient reports the pain is worse when she has a full bladder.  Review of Systems Constitutional:+ hot flashes. Cardiovascular: No chest pain, shortness of breath, or edema.  Pulmonary: No cough or wheeze.  Gastrointestinal: No nausea, vomiting, or diarrhea. No bright red blood per rectum or change in bowel movement.  Genitourinary: No frequency, urgency, or dysuria. No vaginal bleeding or discharge.  Musculoskeletal: + back pain and hip pain  Neurologic: no headache  Psychology: No depression, anxiety, or insomnia.  Current Meds:  Outpatient Encounter Medications as of 08/18/2017  Medication Sig  . Cholecalciferol (VITAMIN D) 2000 UNITS tablet Take 2,000 Units by mouth daily.   No facility-administered encounter medications on file as of 08/18/2017.     Allergy:  Allergies  Allergen Reactions  . Ibuprofen Swelling    Social Hx:   Social History   Socioeconomic History  . Marital status: Married    Spouse name: Not on file  . Number of children: Not on file  . Years of education: Not on file  . Highest education level: Not on file  Social Needs  . Financial resource strain: Not on file  . Food insecurity - worry: Not on file  . Food insecurity - inability: Not on file  . Transportation needs - medical: Not on file  . Transportation needs - non-medical: Not on file  Occupational History  . Not on file  Tobacco Use  . Smoking status: Never Smoker  . Smokeless tobacco: Never Used  Substance and Sexual Activity  . Alcohol use: No  . Drug use: No  . Sexual activity: Yes    Birth control/protection: Surgical  Other Topics Concern  . Not on file  Social History Narrative  . Not on file    Past Surgical Hx:  Past Surgical History:  Procedure Laterality Date  . CERVICAL CONIZATION W/BX N/A 08/31/2015   Procedure: CONIZATION CERVIX WITH BIOPSY;  Surgeon: Everitt Amber, MD;  Location: [facility];  Service: Gynecology;  Laterality: N/A;  . CESAREAN SECTION  07-26-2003  &  12-28-2007   Bilateral Tubal Ligation with last one  . ENDOVENOUS ABLATION SAPHENOUS VEIN W/ LASER Right 05-05-2014   EVLA RIGHT GREATER SAPHENOUS VEIN  BY TODD EARLY MD  . ROBOTIC ASSISTED TOTAL HYSTERECTOMY WITH BILATERAL SALPINGO OOPHERECTOMY Bilateral 10/17/2015   Procedure: XI ROBOTIC ASSISTED TYPE III RADICAL TOTAL HYSTERECTOMY WITH BILATERAL SALPINGECTOMY OOPHORECTOMY WITH SENTINEL LYMPH NODE BIOPSY;  Surgeon: Everitt Amber, MD;  Location: [facility];  Service: Gynecology;  Laterality: Bilateral;    Past Medical Hx:  Past Medical History:  Diagnosis Date  . Cervical adenocarcinoma ([place])   . History of abnormal cervical Pap smear    w/ cryoablation in 2000  . Varicose veins     Family Hx:  Family History  Problem Relation Age of Onset  . Hyperlipidemia Mother   . Diabetes Mother   . Varicose Veins Sister   . Lupus Sister   . Cancer Neg Hx     Vitals:  Blood pressure (!) 107/52, pulse 78, temperature 98.3 F (36.8 C), temperature source Oral, weight 146 lb 14.4 oz (66.6 kg), last menstrual period 09/25/2015, SpO2 100 %.  Physical Exam:  General: Well developed, well nourished female in no acute distress. Alert and oriented x 3.  Neck: Supple without any enlargements.  Lymph node survey: No cervical,  supraclavicular, or inguinal adenopathy.  Cardiovascular: Regular rate and rhythm. S1 and S2 normal.  Lungs: Clear to auscultation bilaterally. No wheezes/crackles/rhonchi noted.  Skin: No rashes or lesions present. Back: No CVA tenderness.  Abdomen: Abdomen soft, non-tender and obese. Active bowel sounds in all quadrants. No evidence of a fluid wave or abdominal masses. Incisions healed Genito-urinary: normal EFG. Normal vagina. Vaginal cuff intact and healed. No lesions. No blood. Extremities: No bilateral cyanosis, edema, or clubbing.   Donaciano Eva, MD 08/18/2017, 1:56 PM

## 2017-08-18 NOTE — Patient Instructions (Signed)
Please notify Dr Denman George at phone number [phone] if you notice vaginal bleeding, new pelvic or abdominal pains, bloating, feeling full easy, or a change in bladder or bowel function.   We have ordered a CT scan to evaluate your back pains and a urine test to evaluate for infection.  Dr Denman George will see you in 6 months.

## 2017-08-18 NOTE — Addendum Note (Signed)
Addended by: Joylene John D on: 08/18/2017 03:55 PM   Modules accepted: Orders

## 2017-08-20 LAB — URINE CULTURE

## 2017-08-28 ENCOUNTER — Encounter (HOSPITAL_COMMUNITY): Payer: Self-pay

## 2017-08-28 ENCOUNTER — Ambulatory Visit (HOSPITAL_COMMUNITY)
Admission: RE | Admit: 2017-08-28 | Discharge: 2017-08-28 | Disposition: A | Discharge status: Home / Self Care | Payer: BLUE CROSS/BLUE SHIELD | Source: Ambulatory Visit | Attending: Gynecologic Oncology | Admitting: Gynecologic Oncology

## 2017-08-28 ENCOUNTER — Telehealth: Payer: Self-pay

## 2017-08-28 DIAGNOSIS — R1031 Right lower quadrant pain: Secondary | ICD-10-CM | POA: Diagnosis present

## 2017-08-28 DIAGNOSIS — Z8541 Personal history of malignant neoplasm of cervix uteri: Secondary | ICD-10-CM | POA: Insufficient documentation

## 2017-08-28 DIAGNOSIS — C539 Malignant neoplasm of cervix uteri, unspecified: Secondary | ICD-10-CM | POA: Diagnosis present

## 2017-08-28 MED ORDER — IOPAMIDOL (ISOVUE-300) INJECTION 61%
100.0000 mL | Freq: Once | INTRAVENOUS | Status: AC | PRN
  Administered 2017-08-28: 100 mL via INTRAVENOUS

## 2017-08-28 MED ORDER — IOPAMIDOL (ISOVUE-300) INJECTION 61%
INTRAVENOUS | Status: AC
  Filled 2017-08-28: qty 100

## 2017-08-28 NOTE — Telephone Encounter (Signed)
Spoke with Almyra Free and requested that she inform Ms Rayle that her CT scan was fine.  No evidence of cancer per Joylene John, NP.

## 2018-02-03 ENCOUNTER — Encounter: Payer: Self-pay | Admitting: Obstetrics & Gynecology

## 2018-02-04 ENCOUNTER — Telehealth: Payer: Self-pay | Admitting: *Deleted

## 2018-02-04 NOTE — Telephone Encounter (Signed)
[place] and left message with the new date/time of an appt on June 10th at 1:15pm

## 2018-02-09 ENCOUNTER — Encounter: Payer: Self-pay | Admitting: Gynecologic Oncology

## 2018-02-09 ENCOUNTER — Other Ambulatory Visit (HOSPITAL_COMMUNITY)
Admission: RE | Admit: 2018-02-09 | Discharge: 2018-02-09 | Disposition: A | Discharge status: Home / Self Care | Payer: BLUE CROSS/BLUE SHIELD | Source: Ambulatory Visit | Attending: Gynecologic Oncology | Admitting: Gynecologic Oncology

## 2018-02-09 ENCOUNTER — Inpatient Hospital Stay: Payer: BLUE CROSS/BLUE SHIELD | Attending: Gynecologic Oncology | Admitting: Gynecologic Oncology

## 2018-02-09 VITALS — BP 103/53 | HR 79 | Temp 98.5°F | Resp 20 | Ht 59.5 in | Wt 146.2 lb

## 2018-02-09 DIAGNOSIS — Z90711 Acquired absence of uterus with remaining cervical stump: Secondary | ICD-10-CM | POA: Insufficient documentation

## 2018-02-09 DIAGNOSIS — Z90710 Acquired absence of both cervix and uterus: Secondary | ICD-10-CM | POA: Insufficient documentation

## 2018-02-09 DIAGNOSIS — Z8541 Personal history of malignant neoplasm of cervix uteri: Secondary | ICD-10-CM | POA: Diagnosis not present

## 2018-02-09 DIAGNOSIS — Z08 Encounter for follow-up examination after completed treatment for malignant neoplasm: Secondary | ICD-10-CM | POA: Diagnosis not present

## 2018-02-09 DIAGNOSIS — C539 Malignant neoplasm of cervix uteri, unspecified: Secondary | ICD-10-CM | POA: Insufficient documentation

## 2018-02-09 DIAGNOSIS — Z90722 Acquired absence of ovaries, bilateral: Secondary | ICD-10-CM

## 2018-02-09 NOTE — Patient Instructions (Signed)
Please notify Dr Denman George at phone number [phone] if you notice vaginal bleeding, new pelvic or abdominal pains, bloating, feeling full easy, or a change in bladder or bowel function.   Please return to see Dr Denman George in 6 months. A pap test will be performed in 12 months.

## 2018-02-09 NOTE — Progress Notes (Signed)
Follow Up Note: Gyn-Onc  Krista Cross 47 y.o. female  CC:  Chief Complaint  Patient presents with  . Cervical cancer, FIGO stage IA2 [facility])   Assessment/Plan:  47 year old female with a history of stage IB1 adenocarcinoma of the cervix, s/pvrobotic-assisted type III radical laparoscopic hysterectomy with bilateral salpingoophorectomy and bilateral pelvic lymphadenectomy on 10/17/15.   Low risk factors therefore no adjuvant therapy prescribed postop.  No evidence of disease on today's exam, though symptoms of back and hip pain - will evaluate with UA given relationiship to urination and with CT abdo/pelvis to rule out recurrence.  I discussed the risk for recurrence and typical symptoms that are manifested with this. I informed the patient to notify us if these develop and see [place] prior to her scheduled appointment.  Prescribed oral premarin for surgical menopause. However patient declines to take due to concern for cancer risk.  Follow-up: 6 monthly exams. Annual pap with HPV to screen for new HPV related dysplasia (not a screening tool for recurrence of her cervical cancer).  She will follow-up with me in 6 months. Next pap due in June 2020.  HPI: Krista Cross is a 47 year old female initially seen in consultation at the request of Dr Toney Rakes for villoglandular adenocarcinoma of the endocervix . The patient had an AGUS pap smear in June, 2016 with high risk HPV detected. Colposcopy and directed biopsies were performed on 08/03/15 which revealed a normal appearing cervix. The endometrium was sampled and showed proliferative endometrium with simple hyperplasia (no atypia), and the endocervical biopsy revealed adenocarcinoma with villoglandular endometrioid features.   On 08/31/15, she underwent a cold knife conization of the cervix.  Final pathology revealed:1. Cervix, cone ADENOCARCINOMA IN SITU, ENDOMETRIOID TYPE WITH SUPERFICIAL INVASION THE CARCINOMA IS1.3 CM IN LENGTH,  0.6 CM IN THICKNESS.  THE ENDOCERVICAL RESECTION MARGIN IS POSITIVE FROM 12 TO 6 O'CLOCK.  NO LYMPHOVASCULAR INVASION IDENTIFIED 2. Endocervix, curettage, post cone:  DETACHED SMALL FRAGMENT OF ADENOCARCINOMA   Based on the cone findings, it was recommended she proceed with a radical hysterectomy.  On 10/17/15, she underwent a Robotic-assisted type III radical laparoscopic hysterectomy with bilateral salpingoophorectomy and bilateral pelvic lymphadenectomy.  Final pathology revealed:  Diagnosis 1. Lymph node, sentinel, biopsy, right external iliac - ONE BENIGN LYMPH NODE WITH NO TUMOR SEEN (0/1). 2. Lymph node, sentinel, biopsy, right external iliac - ONE BENIGN LYMPH NODE WITH NO TUMOR SEEN (0/1). 3. Lymph node, sentinel, biopsy, right obturator - ONE BENIGN LYMPH NODE WITH NO TUMOR SEEN (0/1). 4. Lymph nodes, regional resection, left pelvic - EIGHT BENIGN LYMPH NODES WITH NO TUMOR SEEN (0/8). 5. Uterus +/- tubes/ovaries, neoplastic - CERVIX WITH TWO RESIDUAL FOCI OF ADENOCARCINOMA IN SITU, EACH MEASURING LESS THAN 0.2 CM IN GREATEST DIMENSION. - CERVIX ALSO DEMONSTRATES FOCAL HIGH GRADE SQUAMOUS INTRAEPITHELIAL LESION, CIN-II (MODERATE DYSPLASIA). - ECTOCERVICAL MARGIN IS NEGATIVE FOR DYSPLASIA, ATYPIA OR MALIGNANCY. - SEE COMMENT. ADDITIONAL FINDINGS: - SECRETORY PATTERN ENDOMETRIUM WITH UNDERLYING BENIGN MYOMETRIUM. - BENIGN FALLOPIAN TUBES WITH EVIDENCE OF TUBAL LIGATION AND BENIGN PARATUBAL CYST FORMATION ON THE LEFT. - BENIGN BILATERAL OVARIES WITH HEMORRHAGIC CORPUS LUTEUM FORMATION ON THE LEFT. - ASIDE FROM THE FINDINGS IN THE CERVIX, THERE IS NO ATYPIA OR MALIGNANCY PRESENT IN THE REMAINDER OF THE SPECIMEN.   Interval History:  She presents today with her interpreter for routine follow-up.   Since last visit she is reporting no new symptoms.  Last pap in March 2018 was normal with negative high risk HPV.  CT scan in December, 2018 (for abdominal pain) showed no lesions,  masses, recurrence, or explanation for the pain.  Review of Systems Constitutional:+ hot flashes. Cardiovascular: No chest pain, shortness of breath, or edema.  Pulmonary: No cough or wheeze.  Gastrointestinal: No nausea, vomiting, or diarrhea. No bright red blood per rectum or change in bowel movement.  Genitourinary: No frequency, urgency, or dysuria. No vaginal bleeding or discharge.  Musculoskeletal: no pain. Neurologic: no headache  Psychology: No depression, anxiety, or insomnia.  Current Meds:  Outpatient Encounter Medications as of 02/09/2018  Medication Sig  . Cholecalciferol (VITAMIN D) 2000 UNITS tablet Take 2,000 Units by mouth daily.   No facility-administered encounter medications on file as of 02/09/2018.     Allergy:  Allergies  Allergen Reactions  . Ibuprofen Swelling    Social Hx:   Social History   Socioeconomic History  . Marital status: Married    Spouse name: Not on file  . Number of children: Not on file  . Years of education: Not on file  . Highest education level: Not on file  Occupational History  . Not on file  Social Needs  . Financial resource strain: Not on file  . Food insecurity:    Worry: Not on file    Inability: Not on file  . Transportation needs:    Medical: Not on file    Non-medical: Not on file  Tobacco Use  . Smoking status: Never Smoker  . Smokeless tobacco: Never Used  Substance and Sexual Activity  . Alcohol use: No  . Drug use: No  . Sexual activity: Yes    Birth control/protection: Surgical  Lifestyle  . Physical activity:    Days per week: Not on file    Minutes per session: Not on file  . Stress: Not on file  Relationships  . Social connections:    Talks on phone: Not on file    Gets together: Not on file    Attends religious service: Not on file    Active member of club or organization: Not on file    Attends meetings of clubs or organizations: Not on file    Relationship status: Not on file  . Intimate  partner violence:    Fear of current or ex partner: Not on file    Emotionally abused: Not on file    Physically abused: Not on file    Forced sexual activity: Not on file  Other Topics Concern  . Not on file  Social History Narrative  . Not on file    Past Surgical Hx:  Past Surgical History:  Procedure Laterality Date  . CERVICAL CONIZATION W/BX N/A 08/31/2015   Procedure: CONIZATION CERVIX WITH BIOPSY;  Surgeon: Everitt Amber, MD;  Location: [facility];  Service: Gynecology;  Laterality: N/A;  . CESAREAN SECTION  07-26-2003  &  12-28-2007   Bilateral Tubal Ligation with last one  . ENDOVENOUS ABLATION SAPHENOUS VEIN W/ LASER Right 05-05-2014   EVLA RIGHT GREATER SAPHENOUS VEIN  BY TODD EARLY MD  . ROBOTIC ASSISTED TOTAL HYSTERECTOMY WITH BILATERAL SALPINGO OOPHERECTOMY Bilateral 10/17/2015   Procedure: XI ROBOTIC ASSISTED TYPE III RADICAL TOTAL HYSTERECTOMY WITH BILATERAL SALPINGECTOMY OOPHORECTOMY WITH SENTINEL LYMPH NODE BIOPSY;  Surgeon: Everitt Amber, MD;  Location: [facility];  Service: Gynecology;  Laterality: Bilateral;    Past Medical Hx:  Past Medical History:  Diagnosis Date  . Cervical adenocarcinoma ([place])   . History of abnormal cervical Pap smear  w/ cryoablation in 2000  . Varicose veins     Family Hx:  Family History  Problem Relation Age of Onset  . Hyperlipidemia Mother   . Diabetes Mother   . Varicose Veins Sister   . Lupus Sister   . Cancer Neg Hx     Vitals:  Blood pressure (!) 103/53, pulse 79, temperature 98.5 F (36.9 C), temperature source Oral, resp. rate 20, height 4' 11.5" (1.511 m), weight 146 lb 3.2 oz (66.3 kg), last menstrual period 09/25/2015, SpO2 100 %.  Physical Exam:  General: Well developed, well nourished female in no acute distress. Alert and oriented x 3.  Neck: Supple without any enlargements.  Lymph node survey: No cervical, supraclavicular, or inguinal adenopathy.  Cardiovascular: Regular rate and rhythm. S1 and S2  normal.  Lungs: Clear to auscultation bilaterally. No wheezes/crackles/rhonchi noted.  Skin: No rashes or lesions present. Back: No CVA tenderness.  Abdomen: Abdomen soft, non-tender and obese. Active bowel sounds in all quadrants. No evidence of a fluid wave or abdominal masses. Incisions soft. Genito-urinary: normal EFG. Normal vagina. Vaginal cuff intact and healed. No lesions. No blood. Pap taken.  Extremities: No bilateral cyanosis, edema, or clubbing.   Thereasa Solo, MD 02/09/2018, 1:40 PM

## 2018-02-13 ENCOUNTER — Telehealth: Payer: Self-pay

## 2018-02-13 LAB — CYTOLOGY - PAP
DIAGNOSIS: NEGATIVE
HPV (WINDOPATH): NOT DETECTED

## 2018-02-13 NOTE — Telephone Encounter (Signed)
Outgoing call to patient per Joylene John NP regarding Pap results "normal" - pt voiced understanding and no other needs per pt at this time.

## 2018-02-18 ENCOUNTER — Ambulatory Visit: Payer: BLUE CROSS/BLUE SHIELD | Admitting: Gynecologic Oncology

## 2018-02-25 ENCOUNTER — Ambulatory Visit: Payer: BLUE CROSS/BLUE SHIELD | Admitting: Obstetrics & Gynecology

## 2018-02-25 ENCOUNTER — Encounter: Payer: Self-pay | Admitting: Obstetrics & Gynecology

## 2018-02-25 VITALS — BP 126/78 | Ht 59.0 in | Wt 146.0 lb

## 2018-02-25 DIAGNOSIS — Z01419 Encounter for gynecological examination (general) (routine) without abnormal findings: Secondary | ICD-10-CM

## 2018-02-25 DIAGNOSIS — R300 Dysuria: Secondary | ICD-10-CM | POA: Diagnosis not present

## 2018-02-25 DIAGNOSIS — N952 Postmenopausal atrophic vaginitis: Secondary | ICD-10-CM

## 2018-02-25 DIAGNOSIS — C539 Malignant neoplasm of cervix uteri, unspecified: Secondary | ICD-10-CM | POA: Diagnosis not present

## 2018-02-25 DIAGNOSIS — Z780 Asymptomatic menopausal state: Secondary | ICD-10-CM | POA: Diagnosis not present

## 2018-02-25 MED ORDER — ESTRADIOL 0.1 MG/GM VA CREA: 0.2500 | TOPICAL_CREAM | VAGINAL | 4 refills | Status: DC

## 2018-02-25 NOTE — Progress Notes (Signed)
Krista Cross [birth year] [id number]   History:    47 y.o. G104P4L4 Married  RP:  Established patient presenting for annual gyn exam   HPI: AdenoCa of Cervix FIGO 1A2.  S/P Robotic type III Radical laparoscopic Hysterectomy with BSO and Bilateral Pelvic Lymphadenectomy on 10/17/2015.  Has had ongoing Rt lower back pain, no recent change, no worsening. CT scan of Abdomen and Pelvis in 08/2017 showed no evidence of recurrence.  Stopped Estradiol 1 mg PO.  Dryness with IC.  Urine/BMs wnl.  Breasts normal. BMI 29.49.  Health labs with Fam MD.  Past medical history,surgical history, family history and social history were all reviewed and documented in the EPIC chart.  Gynecologic History Patient's last menstrual period was 09/25/2015. Contraception: status post hysterectomy Last Pap: 02/09/2018. Results were: Negative.  With Dr Denman George. Last mammogram: 02/03/2018. Results were: Negative Bone Density: Never Colonoscopy: Never  Obstetric History OB History  Gravida Para Term Preterm AB Living  '5 4     1 4  ' SAB TAB Ectopic Multiple Live Births  1            # Outcome Date GA Lbr Len/2nd Weight Sex Delivery Anes PTL Lv  5 SAB           4 Para      CS-LTranv     3 Para      CS-LTranv     2 Para      Vag-Spont     1 Para      Vag-Spont        ROS: A ROS was performed and pertinent positives and negatives are included in the history.  GENERAL: No fevers or chills. HEENT: No change in vision, no earache, sore throat or sinus congestion. NECK: No pain or stiffness. CARDIOVASCULAR: No chest pain or pressure. No palpitations. PULMONARY: No shortness of breath, cough or wheeze. GASTROINTESTINAL: No abdominal pain, nausea, vomiting or diarrhea, melena or bright red blood per rectum. GENITOURINARY: No urinary frequency, urgency, hesitancy or dysuria. MUSCULOSKELETAL: No joint or muscle pain, no back pain, no recent trauma. DERMATOLOGIC: No rash, no itching, no lesions. ENDOCRINE: No polyuria,  polydipsia, no heat or cold intolerance. No recent change in weight. HEMATOLOGICAL: No anemia or easy bruising or bleeding. NEUROLOGIC: No headache, seizures, numbness, tingling or weakness. PSYCHIATRIC: No depression, no loss of interest in normal activity or change in sleep pattern.     Exam:   BP 126/78   Ht '4\' 11"'  (1.499 m)   Wt 146 lb (66.2 kg)   LMP 09/25/2015   BMI 29.49 kg/m   Body mass index is 29.49 kg/m.  General appearance : Well developed well nourished female. No acute distress HEENT: Eyes: no retinal hemorrhage or exudates,  Neck supple, trachea midline, no carotid bruits, no thyroidmegaly Lungs: Clear to auscultation, no rhonchi or wheezes, or rib retractions  Heart: Regular rate and rhythm, no murmurs or gallops Breast:Examined in sitting and supine position were symmetrical in appearance, no palpable masses or tenderness,  no skin retraction, no nipple inversion, no nipple discharge, no skin discoloration, no axillary or supraclavicular lymphadenopathy Abdomen: no palpable masses or tenderness, no rebound or guarding Extremities: no edema or skin discoloration or tenderness  Pelvic: Vulva: Normal             Vagina: No gross lesions or discharge  Cervix/Uterus absent  Adnexa  Without masses or tenderness  Anus: Normal  Urine analysis: Yellow clear, nitrites negative, white blood cells negative, red  blood cells negative, bacteria negative.  Completely normal urine analysis, no indication for urine culture.  Assessment/Plan:  47 y.o. female for annual exam   1. Well female exam with routine gynecological exam Radical hysterectomy and BSO.  Pap test done at the vaginal vault with Dr Denman George 02/09/2018 was negative.  Breast exam normal.  Screening mammogram was negative in June 2019.  Fasting health labs here today. - CBC - Comp Met (CMET) - TSH - Lipid panel - VITAMIN D 25 Hydroxy (Vit-D Deficiency, Fractures)  2. Adenocarcinoma of cervix [facility]) Status post  robotic type III radical laparoscopic hysterectomy with BSO and bilateral pelvic lymphadenectomy on February 2017.  Adenocarcinoma of the cervix FIGO 1A2.  Seen by Dr. Denman George February 09, 2018.  Pap at the vaginal vault was negative.  No evidence of recurrence of disease at that visit and on the CT scan in December 2018.  3. Menopause present Well on no hormone replacement therapy currently.  4. Post-menopause atrophic vaginitis Dryness with intercourse.  Will start on estradiol vaginal cream a quarter of an applicator twice a week.  Usage, risks and benefits reviewed.  5. Dysuria Urine analysis completely negative.  No indication for culture. - Urinalysis,Complete w/RFL Culture  Other orders - estradiol (ESTRACE VAGINAL) 0.1 MG/GM vaginal cream; Place 4.75 Applicatorfuls vaginally 2 (two) times a week.  Counseling on above issues and coordination of care more than 50% for 10 minutes.  Princess Bruins MD, 10:46 AM 02/25/2018

## 2018-02-26 LAB — COMPREHENSIVE METABOLIC PANEL
AG Ratio: 1.2 (calc) (ref 1.0–2.5)
ALT: 12 U/L (ref 6–29)
AST: 18 U/L (ref 10–35)
Albumin: 4.1 g/dL (ref 3.6–5.1)
Alkaline phosphatase (APISO): 123 U/L — ABNORMAL HIGH (ref 33–115)
BUN: 16 mg/dL (ref 7–25)
CHLORIDE: 104 mmol/L (ref 98–110)
CO2: 26 mmol/L (ref 20–32)
CREATININE: 0.67 mg/dL (ref 0.50–1.10)
Calcium: 9.1 mg/dL (ref 8.6–10.2)
GLOBULIN: 3.5 g/dL (ref 1.9–3.7)
GLUCOSE: 91 mg/dL (ref 65–99)
Potassium: 4.3 mmol/L (ref 3.5–5.3)
SODIUM: 140 mmol/L (ref 135–146)
TOTAL PROTEIN: 7.6 g/dL (ref 6.1–8.1)
Total Bilirubin: 0.5 mg/dL (ref 0.2–1.2)

## 2018-02-26 LAB — CBC
HEMATOCRIT: 39.9 % (ref 35.0–45.0)
HEMOGLOBIN: 13.2 g/dL (ref 11.7–15.5)
MCH: 27.8 pg (ref 27.0–33.0)
MCHC: 33.1 g/dL (ref 32.0–36.0)
MCV: 84.2 fL (ref 80.0–100.0)
MPV: 11.3 fL (ref 7.5–12.5)
Platelets: 186 10*3/uL (ref 140–400)
RBC: 4.74 10*6/uL (ref 3.80–5.10)
RDW: 12.4 % (ref 11.0–15.0)
WBC: 5.4 10*3/uL (ref 3.8–10.8)

## 2018-02-26 LAB — URINALYSIS, COMPLETE W/RFL CULTURE
BILIRUBIN URINE: NEGATIVE
Bacteria, UA: NONE SEEN /HPF
GLUCOSE, UA: NEGATIVE
Hyaline Cast: NONE SEEN /LPF
Ketones, ur: NEGATIVE
LEUKOCYTE ESTERASE: NEGATIVE
NITRITES URINE, INITIAL: NEGATIVE
PROTEIN: NEGATIVE
RBC / HPF: NONE SEEN /HPF (ref 0–2)
SPECIFIC GRAVITY, URINE: 1.015 (ref 1.001–1.03)
WBC, UA: NONE SEEN /HPF (ref 0–5)
pH: 7.5 (ref 5.0–8.0)

## 2018-02-26 LAB — LIPID PANEL
CHOL/HDL RATIO: 3 (calc) (ref <5.0)
CHOLESTEROL: 175 mg/dL (ref <200)
HDL: 58 mg/dL (ref >50)
LDL CHOLESTEROL (CALC): 101 mg/dL — AB
Non-HDL Cholesterol (Calc): 117 mg/dL (calc) (ref <130)
Triglycerides: 69 mg/dL (ref <150)

## 2018-02-26 LAB — NO CULTURE INDICATED

## 2018-02-26 LAB — VITAMIN D 25 HYDROXY (VIT D DEFICIENCY, FRACTURES): VIT D 25 HYDROXY: 33 ng/mL (ref 30–100)

## 2018-02-26 LAB — TSH: TSH: 1.47 mIU/L

## 2018-03-01 ENCOUNTER — Encounter: Payer: Self-pay | Admitting: Obstetrics & Gynecology

## 2018-03-01 NOTE — Patient Instructions (Addendum)
1. Well female exam with routine gynecological exam Radical hysterectomy and BSO.  Pap test done at the vaginal vault with Dr Denman George 02/09/2018 was negative.  Breast exam normal.  Screening mammogram was negative in June 2019.  Fasting health labs here today. - CBC - Comp Met (CMET) - TSH - Lipid panel - VITAMIN D 25 Hydroxy (Vit-D Deficiency, Fractures)  2. Adenocarcinoma of cervix [facility]) Status post robotic type III radical laparoscopic hysterectomy with BSO and bilateral pelvic lymphadenectomy on February 2017.  Adenocarcinoma of the cervix FIGO 1A2.  Seen by Dr. Denman George February 09, 2018.  Pap at the vaginal vault was negative.  No evidence of recurrence of disease at that visit and on the CT scan in December 2018.  3. Menopause present Well on no hormone replacement therapy currently.  4. Post-menopause atrophic vaginitis Dryness with intercourse.  Will start on estradiol vaginal cream a quarter of an applicator twice a week.  Usage, risks and benefits reviewed.  5. Dysuria Urine analysis completely negative.  No indication for culture. - Urinalysis,Complete w/RFL Culture  Other orders - estradiol (ESTRACE VAGINAL) 0.1 MG/GM vaginal cream; Place 2.55 Applicatorfuls vaginally 2 (two) times a week.  Chanci, fue un placer encontrarle hoy!

## 2018-03-16 ENCOUNTER — Other Ambulatory Visit: Payer: Self-pay | Admitting: Obstetrics & Gynecology

## 2018-03-16 DIAGNOSIS — R945 Abnormal results of liver function studies: Principal | ICD-10-CM

## 2018-03-16 DIAGNOSIS — R7989 Other specified abnormal findings of blood chemistry: Secondary | ICD-10-CM

## 2018-05-14 ENCOUNTER — Other Ambulatory Visit: Payer: BLUE CROSS/BLUE SHIELD

## 2018-05-14 DIAGNOSIS — R7989 Other specified abnormal findings of blood chemistry: Secondary | ICD-10-CM

## 2018-05-14 DIAGNOSIS — R945 Abnormal results of liver function studies: Principal | ICD-10-CM

## 2018-05-15 LAB — COMPREHENSIVE METABOLIC PANEL
AG Ratio: 1.2 (calc) (ref 1.0–2.5)
ALBUMIN MSPROF: 4.1 g/dL (ref 3.6–5.1)
ALT: 12 U/L (ref 6–29)
AST: 17 U/L (ref 10–35)
Alkaline phosphatase (APISO): 112 U/L (ref 33–115)
BUN: 13 mg/dL (ref 7–25)
CHLORIDE: 107 mmol/L (ref 98–110)
CO2: 29 mmol/L (ref 20–32)
CREATININE: 0.8 mg/dL (ref 0.50–1.10)
Calcium: 9.2 mg/dL (ref 8.6–10.2)
GLOBULIN: 3.3 g/dL (ref 1.9–3.7)
Glucose, Bld: 80 mg/dL (ref 65–99)
POTASSIUM: 4.4 mmol/L (ref 3.5–5.3)
Sodium: 143 mmol/L (ref 135–146)
Total Bilirubin: 0.5 mg/dL (ref 0.2–1.2)
Total Protein: 7.4 g/dL (ref 6.1–8.1)

## 2018-08-14 ENCOUNTER — Inpatient Hospital Stay: Payer: BLUE CROSS/BLUE SHIELD | Attending: Gynecologic Oncology | Admitting: Gynecologic Oncology

## 2018-08-14 ENCOUNTER — Encounter: Payer: Self-pay | Admitting: Gynecologic Oncology

## 2018-08-14 ENCOUNTER — Telehealth: Payer: Self-pay | Admitting: *Deleted

## 2018-08-14 ENCOUNTER — Inpatient Hospital Stay: Payer: BLUE CROSS/BLUE SHIELD

## 2018-08-14 VITALS — BP 107/46 | HR 85 | Temp 98.9°F | Resp 20 | Ht 59.0 in

## 2018-08-14 DIAGNOSIS — Z90722 Acquired absence of ovaries, bilateral: Secondary | ICD-10-CM | POA: Diagnosis not present

## 2018-08-14 DIAGNOSIS — Z90710 Acquired absence of both cervix and uterus: Secondary | ICD-10-CM | POA: Insufficient documentation

## 2018-08-14 DIAGNOSIS — Z23 Encounter for immunization: Secondary | ICD-10-CM

## 2018-08-14 DIAGNOSIS — C539 Malignant neoplasm of cervix uteri, unspecified: Secondary | ICD-10-CM | POA: Diagnosis not present

## 2018-08-14 DIAGNOSIS — R1030 Lower abdominal pain, unspecified: Secondary | ICD-10-CM

## 2018-08-14 DIAGNOSIS — C531 Malignant neoplasm of exocervix: Secondary | ICD-10-CM

## 2018-08-14 MED ORDER — INFLUENZA VAC SPLIT QUAD 0.5 ML IM SUSY: 0.5000 mL | PREFILLED_SYRINGE | Freq: Once | INTRAMUSCULAR | Status: DC

## 2018-08-14 NOTE — Progress Notes (Signed)
Follow Up Note: Gyn-Onc  Krista Cross 47 y.o. female  CC:  Chief Complaint  Patient presents with  . Cervical Cancer    Follow up   Assessment/Plan:  47 year old female with a history of stage IB1 adenocarcinoma of the cervix, s/pvrobotic-assisted type III radical laparoscopic hysterectomy with bilateral salpingoophorectomy and bilateral pelvic lymphadenectomy on 10/17/15.   Low risk factors therefore no adjuvant therapy prescribed postop.  No evidence of disease on today's exam, though symptoms of back and hip pain - will evaluate with CT abdo/pelvis to rule out recurrence.  I discussed the risk for recurrence and typical symptoms that are manifested with this. I informed the patient to notify us if these develop and see [place] prior to her scheduled appointment.  Flu shot at patient's request.   Follow-up: 6 monthly exams. Annual pap with HPV to screen for new HPV related dysplasia (not a screening tool for recurrence of her cervical cancer).  She will follow-up with me in 6 months. Next pap due in June 2020.  HPI: Krista Cross is a 47 year old female initially seen in consultation at the request of Dr Toney Rakes for villoglandular adenocarcinoma of the endocervix . The patient had an AGUS pap smear in June, 2016 with high risk HPV detected. Colposcopy and directed biopsies were performed on 08/03/15 which revealed a normal appearing cervix. The endometrium was sampled and showed proliferative endometrium with simple hyperplasia (no atypia), and the endocervical biopsy revealed adenocarcinoma with villoglandular endometrioid features.   On 08/31/15, she underwent a cold knife conization of the cervix.  Final pathology revealed:1. Cervix, cone ADENOCARCINOMA IN SITU, ENDOMETRIOID TYPE WITH SUPERFICIAL INVASION THE CARCINOMA IS1.3 CM IN LENGTH, 0.6 CM IN THICKNESS.  THE ENDOCERVICAL RESECTION MARGIN IS POSITIVE FROM 12 TO 6 O'CLOCK.  NO LYMPHOVASCULAR INVASION IDENTIFIED 2.  Endocervix, curettage, post cone:  DETACHED SMALL FRAGMENT OF ADENOCARCINOMA   Based on the cone findings, it was recommended she proceed with a radical hysterectomy.  On 10/17/15, she underwent a Robotic-assisted type III radical laparoscopic hysterectomy with bilateral salpingoophorectomy and bilateral pelvic lymphadenectomy.  Final pathology revealed:  Diagnosis 1. Lymph node, sentinel, biopsy, right external iliac - ONE BENIGN LYMPH NODE WITH NO TUMOR SEEN (0/1). 2. Lymph node, sentinel, biopsy, right external iliac - ONE BENIGN LYMPH NODE WITH NO TUMOR SEEN (0/1). 3. Lymph node, sentinel, biopsy, right obturator - ONE BENIGN LYMPH NODE WITH NO TUMOR SEEN (0/1). 4. Lymph nodes, regional resection, left pelvic - EIGHT BENIGN LYMPH NODES WITH NO TUMOR SEEN (0/8). 5. Uterus +/- tubes/ovaries, neoplastic - CERVIX WITH TWO RESIDUAL FOCI OF ADENOCARCINOMA IN SITU, EACH MEASURING LESS THAN 0.2 CM IN GREATEST DIMENSION. - CERVIX ALSO DEMONSTRATES FOCAL HIGH GRADE SQUAMOUS INTRAEPITHELIAL LESION, CIN-II (MODERATE DYSPLASIA). - ECTOCERVICAL MARGIN IS NEGATIVE FOR DYSPLASIA, ATYPIA OR MALIGNANCY. - SEE COMMENT. ADDITIONAL FINDINGS: - SECRETORY PATTERN ENDOMETRIUM WITH UNDERLYING BENIGN MYOMETRIUM. - BENIGN FALLOPIAN TUBES WITH EVIDENCE OF TUBAL LIGATION AND BENIGN PARATUBAL CYST FORMATION ON THE LEFT. - BENIGN BILATERAL OVARIES WITH HEMORRHAGIC CORPUS LUTEUM FORMATION ON THE LEFT. - ASIDE FROM THE FINDINGS IN THE CERVIX, THERE IS NO ATYPIA OR MALIGNANCY PRESENT IN THE REMAINDER OF THE SPECIMEN.    Last pap in June 2019 was normal with negative high risk HPV.  CT scan in December, 2018 (for abdominal pain) showed no lesions, masses, recurrence, or explanation for the pain.   Interval History: She presents today with her interpreter for routine follow-up.   She reports abdominal pains that are new  in the right lower quadrant. Not related to other activities.   Review of  Systems Constitutional:+ hot flashes. Cardiovascular: No chest pain, shortness of breath, or edema.  Pulmonary: No cough or wheeze.  Gastrointestinal: No nausea, vomiting, or diarrhea. No bright red blood per rectum or change in bowel movement. +abdo pain Genitourinary: No frequency, urgency, or dysuria. No vaginal bleeding or discharge.  Musculoskeletal: no pain. Neurologic: no headache  Psychology: No depression, anxiety, or insomnia.  Current Meds:  Outpatient Encounter Medications as of 08/14/2018  Medication Sig  . [DISCONTINUED] estradiol (ESTRACE VAGINAL) 0.1 MG/GM vaginal cream Place 2.58 Applicatorfuls vaginally 2 (two) times a week.   No facility-administered encounter medications on file as of 08/14/2018.     Allergy:  Allergies  Allergen Reactions  . Ibuprofen Swelling    Social Hx:   Social History   Socioeconomic History  . Marital status: Married    Spouse name: Not on file  . Number of children: Not on file  . Years of education: Not on file  . Highest education level: Not on file  Occupational History  . Not on file  Social Needs  . Financial resource strain: Not on file  . Food insecurity:    Worry: Not on file    Inability: Not on file  . Transportation needs:    Medical: Not on file    Non-medical: Not on file  Tobacco Use  . Smoking status: Never Smoker  . Smokeless tobacco: Never Used  Substance and Sexual Activity  . Alcohol use: No  . Drug use: No  . Sexual activity: Yes    Partners: Male    Birth control/protection: Surgical    Comment: 1st intercourse- 17, partner- 1, married- 85 yrs   Lifestyle  . Physical activity:    Days per week: Not on file    Minutes per session: Not on file  . Stress: Not on file  Relationships  . Social connections:    Talks on phone: Not on file    Gets together: Not on file    Attends religious service: Not on file    Active member of club or organization: Not on file    Attends meetings of clubs or  organizations: Not on file    Relationship status: Not on file  . Intimate partner violence:    Fear of current or ex partner: Not on file    Emotionally abused: Not on file    Physically abused: Not on file    Forced sexual activity: Not on file  Other Topics Concern  . Not on file  Social History Narrative  . Not on file    Past Surgical Hx:  Past Surgical History:  Procedure Laterality Date  . CERVICAL CONIZATION W/BX N/A 08/31/2015   Procedure: CONIZATION CERVIX WITH BIOPSY;  Surgeon: Everitt Amber, MD;  Location: [facility];  Service: Gynecology;  Laterality: N/A;  . CESAREAN SECTION  07-26-2003  &  12-28-2007   Bilateral Tubal Ligation with last one  . ENDOVENOUS ABLATION SAPHENOUS VEIN W/ LASER Right 05-05-2014   EVLA RIGHT GREATER SAPHENOUS VEIN  BY TODD EARLY MD  . ROBOTIC ASSISTED TOTAL HYSTERECTOMY WITH BILATERAL SALPINGO OOPHERECTOMY Bilateral 10/17/2015   Procedure: XI ROBOTIC ASSISTED TYPE III RADICAL TOTAL HYSTERECTOMY WITH BILATERAL SALPINGECTOMY OOPHORECTOMY WITH SENTINEL LYMPH NODE BIOPSY;  Surgeon: Everitt Amber, MD;  Location: [facility];  Service: Gynecology;  Laterality: Bilateral;    Past Medical Hx:  Past Medical History:  Diagnosis Date  .  Cervical adenocarcinoma ([place])   . History of abnormal cervical Pap smear    w/ cryoablation in 2000  . Varicose veins     Family Hx:  Family History  Problem Relation Age of Onset  . Hyperlipidemia Mother   . Diabetes Mother   . Varicose Veins Sister   . Lupus Sister   . Cancer Neg Hx     Vitals:  Blood pressure (!) 107/46, pulse 85, temperature 98.9 F (37.2 C), resp. rate 20, height 4\' 11"  (1.499 m), last menstrual period 09/25/2015.  Physical Exam:  General: Well developed, well nourished female in no acute distress. Alert and oriented x 3.  Neck: Supple without any enlargements.  Lymph node survey: No cervical, supraclavicular, or inguinal adenopathy.  Cardiovascular: Regular rate and rhythm. S1  and S2 normal.  Lungs: Clear to auscultation bilaterally. No wheezes/crackles/rhonchi noted.  Skin: No rashes or lesions present. Back: No CVA tenderness.  Abdomen: Abdomen soft, non-tender and obese. Active bowel sounds in all quadrants. No evidence of a fluid wave or abdominal masses. Incisions soft. Genito-urinary: normal EFG. Normal vagina. Vaginal cuff intact and healed. No lesions. No blood. Pap taken.  Extremities: No bilateral cyanosis, edema, or clubbing.   Thereasa Solo, MD 08/14/2018, 2:46 PM

## 2018-08-14 NOTE — Patient Instructions (Signed)
Plan to have a CT scan of the abdomen and pelvis to evaluate the abdominal pain you are having.  If normal, plan to follow up in six months or sooner if needed.  Please call for any new symptoms or concerns.

## 2018-08-14 NOTE — Telephone Encounter (Signed)
Called the patient, she has not received her flu injection for today. Patient was at home and wished not to reschedule.

## 2018-08-24 ENCOUNTER — Ambulatory Visit (HOSPITAL_COMMUNITY): Payer: BLUE CROSS/BLUE SHIELD

## 2018-09-10 ENCOUNTER — Ambulatory Visit (HOSPITAL_COMMUNITY)
Admission: RE | Admit: 2018-09-10 | Discharge: 2018-09-10 | Disposition: A | Discharge status: Home / Self Care | Payer: BLUE CROSS/BLUE SHIELD | Source: Ambulatory Visit | Attending: Gynecologic Oncology | Admitting: Gynecologic Oncology

## 2018-09-10 ENCOUNTER — Telehealth: Payer: Self-pay

## 2018-09-10 DIAGNOSIS — C531 Malignant neoplasm of exocervix: Secondary | ICD-10-CM | POA: Diagnosis not present

## 2018-09-10 DIAGNOSIS — R1030 Lower abdominal pain, unspecified: Secondary | ICD-10-CM | POA: Diagnosis present

## 2018-09-10 MED ORDER — IOHEXOL 300 MG/ML  SOLN
100.0000 mL | Freq: Once | INTRAMUSCULAR | Status: AC | PRN
  Administered 2018-09-10: 100 mL via INTRAVENOUS

## 2018-09-10 MED ORDER — SODIUM CHLORIDE (PF) 0.9 % IJ SOLN
INTRAMUSCULAR | Status: AC
  Filled 2018-09-10: qty 50

## 2018-09-10 NOTE — Telephone Encounter (Signed)
Outgoing call to patient per Joylene John NP regarding results of recent CT abd / pelvis as "no evidence of recurrent or metastatic carcinoma and mild hepatic steatosis or mild fatty liver." Used [employer] interpreter # [phone] Spanish. Pt voiced understanding.  Discussed with her fatty liver causes and dietary recommendations and exercise and told her she can discuss further with her family doctor.  Pt voiced understanding.  She would like copy of CT report faxed to her GYN doctor- Dr Dellis Filbert, faxed to her office per pt request. [phone] office and [phone]. No other needs per pt at this time.

## 2019-02-15 ENCOUNTER — Encounter: Payer: Self-pay | Admitting: Gynecologic Oncology

## 2019-02-15 ENCOUNTER — Other Ambulatory Visit (HOSPITAL_COMMUNITY)
Admission: RE | Admit: 2019-02-15 | Discharge: 2019-02-15 | Disposition: A | Discharge status: Home / Self Care | Payer: BLUE CROSS/BLUE SHIELD | Source: Ambulatory Visit | Attending: Gynecologic Oncology | Admitting: Gynecologic Oncology

## 2019-02-15 ENCOUNTER — Inpatient Hospital Stay: Payer: BLUE CROSS/BLUE SHIELD | Attending: Gynecologic Oncology | Admitting: Gynecologic Oncology

## 2019-02-15 ENCOUNTER — Other Ambulatory Visit: Payer: Self-pay

## 2019-02-15 VITALS — BP 90/55 | HR 74 | Temp 98.5°F | Resp 18 | Ht 59.0 in | Wt 148.7 lb

## 2019-02-15 DIAGNOSIS — Z90710 Acquired absence of both cervix and uterus: Secondary | ICD-10-CM | POA: Diagnosis not present

## 2019-02-15 DIAGNOSIS — C530 Malignant neoplasm of endocervix: Secondary | ICD-10-CM | POA: Diagnosis not present

## 2019-02-15 DIAGNOSIS — C539 Malignant neoplasm of cervix uteri, unspecified: Secondary | ICD-10-CM

## 2019-02-15 NOTE — Patient Instructions (Signed)
Please notify Dr Denman George at phone number [phone] if you notice vaginal bleeding, new pelvic or abdominal pains, bloating, feeling full easy, or a change in bladder or bowel function.   Please contact Dr Serita Grit office (at [phone]) in September to request an appointment with her for December, 2020.

## 2019-02-15 NOTE — Progress Notes (Signed)
Follow Up Note: Gyn-Onc  Krista Cross 48 y.o. female  CC:  Chief Complaint  Patient presents with  . Cervical cancer, FIGO stage IB1 [facility])   Assessment/Plan:  48 year old female with a history of stage IB1 adenocarcinoma of the cervix, s/pvrobotic-assisted type III radical laparoscopic hysterectomy with bilateral salpingoophorectomy and bilateral pelvic lymphadenectomy on 10/17/15.   Low risk factors therefore no adjuvant therapy prescribed postop.  No evidence of disease on today's exam.  I discussed the risk for recurrence and typical symptoms that are manifested with this. I informed the patient to notify us if these develop and see [place] prior to her scheduled appointment.  Flu shot at patient's request.   Follow-up: 6 monthly exams. Annual pap with HPV to screen for new HPV related dysplasia (not a screening tool for recurrence of her cervical cancer).  She will follow-up with me in 6 months. Next pap due in June 2021.  HPI: Krista Cross is a 48 year old female initially seen in consultation at the request of Dr Toney Rakes for villoglandular adenocarcinoma of the endocervix . The patient had an AGUS pap smear in June, 2016 with high risk HPV detected. Colposcopy and directed biopsies were performed on 08/03/15 which revealed a normal appearing cervix. The endometrium was sampled and showed proliferative endometrium with simple hyperplasia (no atypia), and the endocervical biopsy revealed adenocarcinoma with villoglandular endometrioid features.   On 08/31/15, she underwent a cold knife conization of the cervix.  Final pathology revealed:1. Cervix, cone ADENOCARCINOMA IN SITU, ENDOMETRIOID TYPE WITH SUPERFICIAL INVASION THE CARCINOMA IS1.3 CM IN LENGTH, 0.6 CM IN THICKNESS.  THE ENDOCERVICAL RESECTION MARGIN IS POSITIVE FROM 12 TO 6 O'CLOCK.  NO LYMPHOVASCULAR INVASION IDENTIFIED 2. Endocervix, curettage, post cone:  DETACHED SMALL FRAGMENT OF ADENOCARCINOMA   Based on  the cone findings, it was recommended she proceed with a radical hysterectomy.  On 10/17/15, she underwent a Robotic-assisted type III radical laparoscopic hysterectomy with bilateral salpingoophorectomy and bilateral pelvic lymphadenectomy.  Final pathology revealed:  Diagnosis 1. Lymph node, sentinel, biopsy, right external iliac - ONE BENIGN LYMPH NODE WITH NO TUMOR SEEN (0/1). 2. Lymph node, sentinel, biopsy, right external iliac - ONE BENIGN LYMPH NODE WITH NO TUMOR SEEN (0/1). 3. Lymph node, sentinel, biopsy, right obturator - ONE BENIGN LYMPH NODE WITH NO TUMOR SEEN (0/1). 4. Lymph nodes, regional resection, left pelvic - EIGHT BENIGN LYMPH NODES WITH NO TUMOR SEEN (0/8). 5. Uterus +/- tubes/ovaries, neoplastic - CERVIX WITH TWO RESIDUAL FOCI OF ADENOCARCINOMA IN SITU, EACH MEASURING LESS THAN 0.2 CM IN GREATEST DIMENSION. - CERVIX ALSO DEMONSTRATES FOCAL HIGH GRADE SQUAMOUS INTRAEPITHELIAL LESION, CIN-II (MODERATE DYSPLASIA). - ECTOCERVICAL MARGIN IS NEGATIVE FOR DYSPLASIA, ATYPIA OR MALIGNANCY. - SEE COMMENT. ADDITIONAL FINDINGS: - SECRETORY PATTERN ENDOMETRIUM WITH UNDERLYING BENIGN MYOMETRIUM. - BENIGN FALLOPIAN TUBES WITH EVIDENCE OF TUBAL LIGATION AND BENIGN PARATUBAL CYST FORMATION ON THE LEFT. - BENIGN BILATERAL OVARIES WITH HEMORRHAGIC CORPUS LUTEUM FORMATION ON THE LEFT. - ASIDE FROM THE FINDINGS IN THE CERVIX, THERE IS NO ATYPIA OR MALIGNANCY PRESENT IN THE REMAINDER OF THE SPECIMEN.    Last pap in June 2019 was normal with negative high risk HPV.  CT scan in December, 2018 (for abdominal pain) showed no lesions, masses, recurrence, or explanation for the pain.  CT scan was performed again in January, 2020 for the same abdominal pain. This again was unremarkable.    Interval History: She presents today with her interpreter for routine follow-up.   She reports stable, intermittent abdominal pains  that are new in the right lower quadrant. Not related to other  activities.   Review of Systems Constitutional:+ hot flashes. Cardiovascular: No chest pain, shortness of breath, or edema.  Pulmonary: No cough or wheeze.  Gastrointestinal: No nausea, vomiting, or diarrhea. No bright red blood per rectum or change in bowel movement. +abdo pain Genitourinary: No frequency, urgency, or dysuria. No vaginal bleeding or discharge.  Musculoskeletal: no pain. Neurologic: no headache  Psychology: No depression, anxiety, or insomnia.  Current Meds:  Outpatient Encounter Medications as of 02/15/2019  Medication Sig  . Multiple Vitamins-Minerals (MULTIVITAMIN WITH MINERALS) tablet Take 1 tablet by mouth daily.   No facility-administered encounter medications on file as of 02/15/2019.     Allergy:  Allergies  Allergen Reactions  . Ibuprofen Swelling    Social Hx:   Social History   Socioeconomic History  . Marital status: Married    Spouse name: Not on file  . Number of children: Not on file  . Years of education: Not on file  . Highest education level: Not on file  Occupational History  . Not on file  Social Needs  . Financial resource strain: Not on file  . Food insecurity    Worry: Not on file    Inability: Not on file  . Transportation needs    Medical: Not on file    Non-medical: Not on file  Tobacco Use  . Smoking status: Never Smoker  . Smokeless tobacco: Never Used  Substance and Sexual Activity  . Alcohol use: No  . Drug use: No  . Sexual activity: Yes    Partners: Male    Birth control/protection: Surgical    Comment: 1st intercourse- 27, partner- 1, married- 50 yrs   Lifestyle  . Physical activity    Days per week: Not on file    Minutes per session: Not on file  . Stress: Not on file  Relationships  . Social Herbalist on phone: Not on file    Gets together: Not on file    Attends religious service: Not on file    Active member of club or organization: Not on file    Attends meetings of clubs or  organizations: Not on file    Relationship status: Not on file  . Intimate partner violence    Fear of current or ex partner: Not on file    Emotionally abused: Not on file    Physically abused: Not on file    Forced sexual activity: Not on file  Other Topics Concern  . Not on file  Social History Narrative  . Not on file    Past Surgical Hx:  Past Surgical History:  Procedure Laterality Date  . CERVICAL CONIZATION W/BX N/A 08/31/2015   Procedure: CONIZATION CERVIX WITH BIOPSY;  Surgeon: Everitt Amber, MD;  Location: [facility];  Service: Gynecology;  Laterality: N/A;  . CESAREAN SECTION  07-26-2003  &  12-28-2007   Bilateral Tubal Ligation with last one  . ENDOVENOUS ABLATION SAPHENOUS VEIN W/ LASER Right 05-05-2014   EVLA RIGHT GREATER SAPHENOUS VEIN  BY TODD EARLY MD  . ROBOTIC ASSISTED TOTAL HYSTERECTOMY WITH BILATERAL SALPINGO OOPHERECTOMY Bilateral 10/17/2015   Procedure: XI ROBOTIC ASSISTED TYPE III RADICAL TOTAL HYSTERECTOMY WITH BILATERAL SALPINGECTOMY OOPHORECTOMY WITH SENTINEL LYMPH NODE BIOPSY;  Surgeon: Everitt Amber, MD;  Location: [facility];  Service: Gynecology;  Laterality: Bilateral;    Past Medical Hx:  Past Medical History:  Diagnosis Date  .  Cervical adenocarcinoma ([place])   . History of abnormal cervical Pap smear    w/ cryoablation in 2000  . Varicose veins     Family Hx:  Family History  Problem Relation Age of Onset  . Hyperlipidemia Mother   . Diabetes Mother   . Varicose Veins Sister   . Lupus Sister   . Cancer Neg Hx     Vitals:  Blood pressure (!) 90/55, pulse 74, temperature 98.5 F (36.9 C), temperature source Oral, resp. rate 18, height 4\' 11"  (1.499 m), weight 148 lb 11.2 oz (67.4 kg), last menstrual period 09/25/2015, SpO2 100 %.  Physical Exam:  General: Well developed, well nourished female in no acute distress. Alert and oriented x 3.  Neck: Supple without any enlargements.  Lymph node survey: No cervical, supraclavicular, or  inguinal adenopathy.  Cardiovascular: Regular rate and rhythm. S1 and S2 normal.  Lungs: Clear to auscultation bilaterally. No wheezes/crackles/rhonchi noted.  Skin: No rashes or lesions present. Back: No CVA tenderness.  Abdomen: Abdomen soft, non-tender and obese. Active bowel sounds in all quadrants. No evidence of a fluid wave or abdominal masses. Incisions soft. Genito-urinary: normal EFG. Normal vagina. Vaginal cuff intact and healed. No lesions. No blood. Pap taken.  Extremities: No bilateral cyanosis, edema, or clubbing.   Thereasa Solo, MD 02/15/2019, 2:28 PM

## 2019-02-18 LAB — CYTOLOGY - PAP
Diagnosis: NEGATIVE
HPV: NOT DETECTED

## 2019-02-19 ENCOUNTER — Telehealth: Payer: Self-pay

## 2019-02-19 NOTE — Telephone Encounter (Signed)
[name] interpreter will call Krista Cross and let her know that her pap smear was normal and the HPV testing was negative. Follow up in December as instructed.

## 2019-03-09 ENCOUNTER — Other Ambulatory Visit: Payer: Self-pay

## 2019-03-10 ENCOUNTER — Encounter: Payer: Self-pay | Admitting: Obstetrics & Gynecology

## 2019-03-10 ENCOUNTER — Ambulatory Visit: Payer: BLUE CROSS/BLUE SHIELD | Admitting: Obstetrics & Gynecology

## 2019-03-10 VITALS — BP 100/68 | Ht 59.0 in | Wt 146.0 lb

## 2019-03-10 DIAGNOSIS — Z8541 Personal history of malignant neoplasm of cervix uteri: Secondary | ICD-10-CM | POA: Diagnosis not present

## 2019-03-10 DIAGNOSIS — N952 Postmenopausal atrophic vaginitis: Secondary | ICD-10-CM

## 2019-03-10 DIAGNOSIS — C539 Malignant neoplasm of cervix uteri, unspecified: Secondary | ICD-10-CM

## 2019-03-10 DIAGNOSIS — Z01419 Encounter for gynecological examination (general) (routine) without abnormal findings: Secondary | ICD-10-CM

## 2019-03-10 NOTE — Progress Notes (Signed)
Krista Cross 1971-01-14 [id number]   History:    48 y.o. [id number] Married  RP:  Established patient presenting for annual gyn exam   HPI: AdenoCa of Cervix FIGO 1A2.  S/P Robotic type III Radical laparoscopic Hysterectomy with BSO and Bilateral Pelvic Lymphadenectomy on 10/17/2015.  Seen by Dr Denman George 02/15/2019, Pap test at vaginal vault negative.  Has had ongoing Rt lower back pain, no recent change, no worsening, feels it more when walking. CT scan of Abdomen and Pelvis in 09/10/2018 showed no evidence of recurrence.  No HRT x >1 year.  Dryness with IC.  Urine/BMs wnl.  Breasts normal. BMI 29.49.  Walking regularly.  Fasting health labs here today.  Past medical history,surgical history, family history and social history were all reviewed and documented in the EPIC chart.  Gynecologic History Patient's last menstrual period was 09/25/2015. Contraception: status post hysterectomy Last Pap: 02/15/2019. Results were: Negative (Dr Everitt Amber) Last mammogram: 02/03/2018. Results were: Negative.  Will schedule now at the [place]: Never Colonoscopy: Never  Obstetric History OB History  Gravida Para Term Preterm AB Living  '5 4     1 4  ' SAB TAB Ectopic Multiple Live Births  1            # Outcome Date GA Lbr Len/2nd Weight Sex Delivery Anes PTL Lv  5 SAB           4 Para      CS-LTranv     3 Para      CS-LTranv     2 Para      Vag-Spont     1 Para      Vag-Spont        ROS: A ROS was performed and pertinent positives and negatives are included in the history.  GENERAL: No fevers or chills. HEENT: No change in vision, no earache, sore throat or sinus congestion. NECK: No pain or stiffness. CARDIOVASCULAR: No chest pain or pressure. No palpitations. PULMONARY: No shortness of breath, cough or wheeze. GASTROINTESTINAL: No abdominal pain, nausea, vomiting or diarrhea, melena or bright red blood per rectum. GENITOURINARY: No urinary frequency, urgency, hesitancy or  dysuria. MUSCULOSKELETAL: No joint or muscle pain, no back pain, no recent trauma. DERMATOLOGIC: No rash, no itching, no lesions. ENDOCRINE: No polyuria, polydipsia, no heat or cold intolerance. No recent change in weight. HEMATOLOGICAL: No anemia or easy bruising or bleeding. NEUROLOGIC: No headache, seizures, numbness, tingling or weakness. PSYCHIATRIC: No depression, no loss of interest in normal activity or change in sleep pattern.     Exam:   BP 100/68 (BP Location: Right Arm, Patient Position: Sitting, Cuff Size: Normal)   Ht '4\' 11"'  (1.499 m)   Wt 146 lb (66.2 kg)   LMP 09/25/2015   BMI 29.49 kg/m   Body mass index is 29.49 kg/m.  General appearance : Well developed well nourished female. No acute distress HEENT: Eyes: no retinal hemorrhage or exudates,  Neck supple, trachea midline, no carotid bruits, no thyroidmegaly Lungs: Clear to auscultation, no rhonchi or wheezes, or rib retractions  Heart: Regular rate and rhythm, no murmurs or gallops Breast:Examined in sitting and supine position were symmetrical in appearance, no palpable masses or tenderness,  no skin retraction, no nipple inversion, no nipple discharge, no skin discoloration, no axillary or supraclavicular lymphadenopathy Abdomen: no palpable masses or tenderness, no rebound or guarding Extremities: no edema or skin discoloration or tenderness  Pelvic: Vulva: Normal  Vagina: No gross lesions or discharge  Cervix/Uterus absent  Adnexa  Without masses or tenderness  Anus: Normal   Assessment/Plan:  48 y.o. female for annual exam   1. Encounter for Papanicolaou smear of vagina as part of routine gynecological examination Gynecologic exam status post total hysterectomy and BSO for cervical cancer.  Pap test of the vaginal vault on February 15, 2019 was negative.  Repeating Pap test every 6 months.  Breast exam normal.  Patient will call the breast center for a screening mammogram now.  Fasting health labs here  today.  Body mass index at 29.49.  Recommend a lower calorie/carb diet such as [employer].  Aerobic physical activities 5 times a week and weightlifting every 2 days. - CBC - Comp Met (CMET) - Lipid panel - TSH - VITAMIN D 25 Hydroxy (Vit-D Deficiency, Fractures)  2. Adenocarcinoma of cervix ([place]) Adenocarcinoma of cervix Figueroa stage I A2 status post robotic type III radical laparoscopic hysterectomy with BSO and bilateral pelvic lymphadenectomy on October 17, 2015.  Followed by Dr. Denman George.  CT scan of abdomen and pelvis negative with no evidence of recurrence September 05, 2018.  Pap test of the vaginal vault February 15, 2019 was negative.  Gynecologic exam today negative.  We will follow-up at 6 months for a repeat Pap test.  3. Post-menopause atrophic vaginitis Recommend coconut oil as needed.  Princess Bruins MD, 9:27 AM 03/10/2019

## 2019-03-10 NOTE — Patient Instructions (Signed)
1. Encounter for Papanicolaou smear of vagina as part of routine gynecological examination Gynecologic exam status post total hysterectomy and BSO for cervical cancer.  Pap test of the vaginal vault on February 15, 2019 was negative.  Repeating Pap test every 6 months.  Breast exam normal.  Patient will call the breast center for a screening mammogram now.  Fasting health labs here today.  Body mass index at 29.49.  Recommend a lower calorie/carb diet such as [employer].  Aerobic physical activities 5 times a week and weightlifting every 2 days. - CBC - Comp Met (CMET) - Lipid panel - TSH - VITAMIN D 25 Hydroxy (Vit-D Deficiency, Fractures)  2. Adenocarcinoma of cervix ([place]) Adenocarcinoma of cervix Figueroa stage I A2 status post robotic type III radical laparoscopic hysterectomy with BSO and bilateral pelvic lymphadenectomy on October 17, 2015.  Followed by Dr. Denman George.  CT scan of abdomen and pelvis negative with no evidence of recurrence September 05, 2018.  Pap test of the vaginal vault February 15, 2019 was negative.  Gynecologic exam today negative.  We will follow-up at 6 months for a repeat Pap test.  3. Post-menopause atrophic vaginitis Recommend coconut oil as needed.  Evalyse, fue un placer verle hoy!  Voy a informarle de sus [employer].  Vamos a repetir el Papanicolaou en 6 meses.

## 2019-03-11 LAB — COMPREHENSIVE METABOLIC PANEL
AG Ratio: 1.3 (calc) (ref 1.0–2.5)
ALT: 13 U/L (ref 6–29)
AST: 16 U/L (ref 10–35)
Albumin: 4.3 g/dL (ref 3.6–5.1)
Alkaline phosphatase (APISO): 113 U/L (ref 31–125)
BUN: 16 mg/dL (ref 7–25)
CO2: 27 mmol/L (ref 20–32)
Calcium: 9.2 mg/dL (ref 8.6–10.2)
Chloride: 105 mmol/L (ref 98–110)
Creat: 0.66 mg/dL (ref 0.50–1.10)
Globulin: 3.2 g/dL (calc) (ref 1.9–3.7)
Glucose, Bld: 89 mg/dL (ref 65–99)
Potassium: 4.2 mmol/L (ref 3.5–5.3)
Sodium: 140 mmol/L (ref 135–146)
Total Bilirubin: 0.5 mg/dL (ref 0.2–1.2)
Total Protein: 7.5 g/dL (ref 6.1–8.1)

## 2019-03-11 LAB — CBC
HCT: 40.6 % (ref 35.0–45.0)
Hemoglobin: 13.4 g/dL (ref 11.7–15.5)
MCH: 28.6 pg (ref 27.0–33.0)
MCHC: 33 g/dL (ref 32.0–36.0)
MCV: 86.6 fL (ref 80.0–100.0)
MPV: 11.1 fL (ref 7.5–12.5)
Platelets: 193 10*3/uL (ref 140–400)
RBC: 4.69 10*6/uL (ref 3.80–5.10)
RDW: 12.7 % (ref 11.0–15.0)
WBC: 5.5 10*3/uL (ref 3.8–10.8)

## 2019-03-11 LAB — LIPID PANEL
Cholesterol: 188 mg/dL (ref <200)
HDL: 60 mg/dL (ref >=50)
LDL Cholesterol (Calc): 111 mg/dL (calc) — ABNORMAL HIGH
Non-HDL Cholesterol (Calc): 128 mg/dL (calc) (ref <130)
Total CHOL/HDL Ratio: 3.1 (calc) (ref <5.0)
Triglycerides: 78 mg/dL (ref <150)

## 2019-03-11 LAB — VITAMIN D 25 HYDROXY (VIT D DEFICIENCY, FRACTURES): Vit D, 25-Hydroxy: 27 ng/mL — ABNORMAL LOW (ref 30–100)

## 2019-03-11 LAB — TSH: TSH: 1.41 mIU/L

## 2019-05-13 ENCOUNTER — Other Ambulatory Visit: Payer: Self-pay

## 2019-05-13 DIAGNOSIS — Z20822 Contact with and (suspected) exposure to covid-19: Secondary | ICD-10-CM

## 2019-05-15 LAB — NOVEL CORONAVIRUS, NAA: SARS-CoV-2, NAA: NOT DETECTED

## 2019-06-09 ENCOUNTER — Telehealth: Payer: Self-pay

## 2019-06-09 NOTE — Telephone Encounter (Signed)
Paschal Dopp to let Krista Cross know that her insurance plan denied the request to have Dr. Denman George covered as in network under her current plan.  An  appeal letter with additional information  was also denied. Denial sent to be scanned in to patient's EMR.

## 2019-09-29 ENCOUNTER — Encounter: Payer: Self-pay | Admitting: Gynecologic Oncology

## 2019-09-30 ENCOUNTER — Inpatient Hospital Stay: Payer: 59 | Attending: Gynecologic Oncology | Admitting: Gynecologic Oncology

## 2019-09-30 ENCOUNTER — Encounter: Payer: Self-pay | Admitting: Gynecologic Oncology

## 2019-09-30 ENCOUNTER — Other Ambulatory Visit: Payer: Self-pay

## 2019-09-30 VITALS — BP 98/51 | HR 82 | Temp 97.2°F | Resp 18 | Ht 59.0 in | Wt 149.3 lb

## 2019-09-30 DIAGNOSIS — Z8349 Family history of other endocrine, nutritional and metabolic diseases: Secondary | ICD-10-CM | POA: Diagnosis not present

## 2019-09-30 DIAGNOSIS — Z9079 Acquired absence of other genital organ(s): Secondary | ICD-10-CM | POA: Insufficient documentation

## 2019-09-30 DIAGNOSIS — Z08 Encounter for follow-up examination after completed treatment for malignant neoplasm: Secondary | ICD-10-CM | POA: Diagnosis not present

## 2019-09-30 DIAGNOSIS — Z8541 Personal history of malignant neoplasm of cervix uteri: Secondary | ICD-10-CM | POA: Diagnosis not present

## 2019-09-30 DIAGNOSIS — Z90710 Acquired absence of both cervix and uterus: Secondary | ICD-10-CM | POA: Insufficient documentation

## 2019-09-30 DIAGNOSIS — C539 Malignant neoplasm of cervix uteri, unspecified: Secondary | ICD-10-CM

## 2019-09-30 DIAGNOSIS — Z90722 Acquired absence of ovaries, bilateral: Secondary | ICD-10-CM | POA: Insufficient documentation

## 2019-09-30 DIAGNOSIS — Z833 Family history of diabetes mellitus: Secondary | ICD-10-CM | POA: Diagnosis not present

## 2019-09-30 NOTE — Patient Instructions (Signed)
Please notify Dr Denman George at phone number [phone] if you notice vaginal bleeding, new pelvic or abdominal pains, bloating, feeling full easy, or a change in bladder or bowel function.   Please contact Dr Serita Grit office (at [phone]) in October, 2021 to request an appointment with her for January, 2022.  Please follow-up with Dr Dellis Filbert in July, 2021 for a check-up exam.

## 2019-09-30 NOTE — Progress Notes (Signed)
Follow Up Note: Gyn-Onc  Krista Cross 49 y.o. female  CC:  Chief Complaint  Patient presents with  . Cervical cancer, FIGO stage IB1 [facility])   Assessment/Plan:  49 year old female with a history of stage IB1 adenocarcinoma of the cervix, s/pvrobotic-assisted type III radical laparoscopic hysterectomy with bilateral salpingoophorectomy and bilateral pelvic lymphadenectomy on 10/17/15.   Low risk factors therefore no adjuvant therapy prescribed postop.  No evidence of disease on today's exam.  I discussed the risk for recurrence and typical symptoms that are manifested with this. I informed the patient to notify us if these develop and see [place] prior to her scheduled appointment.  Follow-up: 6 monthly exams. Annual pap with HPV to screen for new HPV related dysplasia (not a screening tool for recurrence of her cervical cancer).  She will follow-up with Dr Dellis Filbert 6 months. Next pap due in June 2021.  She will follow-up with me in January, 2022. At that time she will have reached 5 years of surveillance and can be discharged from the [facility].   HPI: Krista Cross is a 49 year old female initially seen in consultation at the request of Dr Toney Rakes for villoglandular adenocarcinoma of the endocervix . The patient had an AGUS pap smear in June, 2016 with high risk HPV detected. Colposcopy and directed biopsies were performed on 08/03/15 which revealed a normal appearing cervix. The endometrium was sampled and showed proliferative endometrium with simple hyperplasia (no atypia), and the endocervical biopsy revealed adenocarcinoma with villoglandular endometrioid features.   On 08/31/15, she underwent a cold knife conization of the cervix.  Final pathology revealed:1. Cervix, cone ADENOCARCINOMA IN SITU, ENDOMETRIOID TYPE WITH SUPERFICIAL INVASION THE CARCINOMA IS1.3 CM IN LENGTH, 0.6 CM IN THICKNESS.  THE ENDOCERVICAL RESECTION MARGIN IS POSITIVE FROM 12 TO 6 O'CLOCK.  NO  LYMPHOVASCULAR INVASION IDENTIFIED 2. Endocervix, curettage, post cone:  DETACHED SMALL FRAGMENT OF ADENOCARCINOMA   Based on the cone findings, it was recommended she proceed with a radical hysterectomy.  On 10/17/15, she underwent a Robotic-assisted type III radical laparoscopic hysterectomy with bilateral salpingoophorectomy and bilateral pelvic lymphadenectomy.  Final pathology revealed:  Diagnosis 1. Lymph node, sentinel, biopsy, right external iliac - ONE BENIGN LYMPH NODE WITH NO TUMOR SEEN (0/1). 2. Lymph node, sentinel, biopsy, right external iliac - ONE BENIGN LYMPH NODE WITH NO TUMOR SEEN (0/1). 3. Lymph node, sentinel, biopsy, right obturator - ONE BENIGN LYMPH NODE WITH NO TUMOR SEEN (0/1). 4. Lymph nodes, regional resection, left pelvic - EIGHT BENIGN LYMPH NODES WITH NO TUMOR SEEN (0/8). 5. Uterus +/- tubes/ovaries, neoplastic - CERVIX WITH TWO RESIDUAL FOCI OF ADENOCARCINOMA IN SITU, EACH MEASURING LESS THAN 0.2 CM IN GREATEST DIMENSION. - CERVIX ALSO DEMONSTRATES FOCAL HIGH GRADE SQUAMOUS INTRAEPITHELIAL LESION, CIN-II (MODERATE DYSPLASIA). - ECTOCERVICAL MARGIN IS NEGATIVE FOR DYSPLASIA, ATYPIA OR MALIGNANCY. - SEE COMMENT. ADDITIONAL FINDINGS: - SECRETORY PATTERN ENDOMETRIUM WITH UNDERLYING BENIGN MYOMETRIUM. - BENIGN FALLOPIAN TUBES WITH EVIDENCE OF TUBAL LIGATION AND BENIGN PARATUBAL CYST FORMATION ON THE LEFT. - BENIGN BILATERAL OVARIES WITH HEMORRHAGIC CORPUS LUTEUM FORMATION ON THE LEFT. - ASIDE FROM THE FINDINGS IN THE CERVIX, THERE IS NO ATYPIA OR MALIGNANCY PRESENT IN THE REMAINDER OF THE SPECIMEN.    Last pap in June 2020 was normal with negative high risk HPV.  CT scan in December, 2018 (for abdominal pain) showed no lesions, masses, recurrence, or explanation for the pain.  CT scan was performed again in January, 2020 for the same abdominal pain. This again was unremarkable.  Interval History: She presents today with no complaints.   Review of  Systems Constitutional:+ hot flashes. Cardiovascular: No chest pain, shortness of breath, or edema.  Pulmonary: No cough or wheeze.  Gastrointestinal: No nausea, vomiting, or diarrhea. No bright red blood per rectum or change in bowel movement.  Genitourinary: No frequency, urgency, or dysuria. No vaginal bleeding or discharge.  Musculoskeletal: no pain. Neurologic: no headache  Psychology: No depression, anxiety, or insomnia.  Current Meds:  Outpatient Encounter Medications as of 09/30/2019  Medication Sig  . Multiple Vitamins-Minerals (MULTIVITAMIN WITH MINERALS) tablet Take 1 tablet by mouth daily.   No facility-administered encounter medications on file as of 09/30/2019.    Allergy:  Allergies  Allergen Reactions  . Ibuprofen Swelling    Social Hx:   Social History   Socioeconomic History  . Marital status: Married    Spouse name: Not on file  . Number of children: Not on file  . Years of education: Not on file  . Highest education level: Not on file  Occupational History  . Not on file  Tobacco Use  . Smoking status: Never Smoker  . Smokeless tobacco: Never Used  Substance and Sexual Activity  . Alcohol use: No  . Drug use: No  . Sexual activity: Yes    Partners: Male    Birth control/protection: Surgical    Comment: 1st intercourse- 49, partner- 1, married- 63 yrs   Other Topics Concern  . Not on file  Social History Narrative  . Not on file   Social Determinants of Health   Financial Resource Strain:   . Difficulty of Paying Living Expenses: Not on file  Food Insecurity:   . Worried About Charity fundraiser in the Last Year: Not on file  . Ran Out of Food in the Last Year: Not on file  Transportation Needs:   . Lack of Transportation (Medical): Not on file  . Lack of Transportation (Non-Medical): Not on file  Physical Activity:   . Days of Exercise per Week: Not on file  . Minutes of Exercise per Session: Not on file  Stress:   . Feeling of Stress  : Not on file  Social Connections:   . Frequency of Communication with Friends and Family: Not on file  . Frequency of Social Gatherings with Friends and Family: Not on file  . Attends Religious Services: Not on file  . Active Member of Clubs or Organizations: Not on file  . Attends Archivist Meetings: Not on file  . Marital Status: Not on file  Intimate Partner Violence:   . Fear of Current or Ex-Partner: Not on file  . Emotionally Abused: Not on file  . Physically Abused: Not on file  . Sexually Abused: Not on file    Past Surgical Hx:  Past Surgical History:  Procedure Laterality Date  . CERVICAL CONIZATION W/BX N/A 08/31/2015   Procedure: CONIZATION CERVIX WITH BIOPSY;  Surgeon: Everitt Amber, MD;  Location: [facility];  Service: Gynecology;  Laterality: N/A;  . CESAREAN SECTION  07-26-2003  &  12-28-2007   Bilateral Tubal Ligation with last one  . ENDOVENOUS ABLATION SAPHENOUS VEIN W/ LASER Right 05-05-2014   EVLA RIGHT GREATER SAPHENOUS VEIN  BY TODD EARLY MD  . ROBOTIC ASSISTED TOTAL HYSTERECTOMY WITH BILATERAL SALPINGO OOPHERECTOMY Bilateral 10/17/2015   Procedure: XI ROBOTIC ASSISTED TYPE III RADICAL TOTAL HYSTERECTOMY WITH BILATERAL SALPINGECTOMY OOPHORECTOMY WITH SENTINEL LYMPH NODE BIOPSY;  Surgeon: Everitt Amber, MD;  Location:  [facility];  Service: Gynecology;  Laterality: Bilateral;    Past Medical Hx:  Past Medical History:  Diagnosis Date  . Cervical adenocarcinoma ([place])   . History of abnormal cervical Pap smear    w/ cryoablation in 2000  . Varicose veins     Family Hx:  Family History  Problem Relation Age of Onset  . Hyperlipidemia Mother   . Diabetes Mother   . Varicose Veins Sister   . Lupus Sister   . Cancer Neg Hx     Vitals:  Blood pressure (!) 98/51, pulse 82, temperature (!) 97.2 F (36.2 C), temperature source Temporal, resp. rate 18, height 4\' 11"  (1.499 m), weight 149 lb 4.8 oz (67.7 kg), last menstrual period 09/25/2015,  SpO2 100 %.  Physical Exam:  General: Well developed, well nourished female in no acute distress. Alert and oriented x 3.  Neck: Supple without any enlargements.  Lymph node survey: No cervical, supraclavicular, or inguinal adenopathy.  Cardiovascular: Regular rate and rhythm. S1 and S2 normal.  Lungs: Clear to auscultation bilaterally. No wheezes/crackles/rhonchi noted.  Skin: No rashes or lesions present. Back: No CVA tenderness.  Abdomen: Abdomen soft, non-tender and obese. Active bowel sounds in all quadrants. No evidence of a fluid wave or abdominal masses. Incisions soft. Genito-urinary: normal EFG. Normal vagina. Vaginal cuff intact and healed. No lesions. No blood.  Extremities: No bilateral cyanosis, edema, or clubbing.   Thereasa Solo, MD 09/30/2019, 3:52 PM

## 2020-04-17 ENCOUNTER — Encounter: Payer: Self-pay | Admitting: Obstetrics & Gynecology

## 2020-04-17 ENCOUNTER — Ambulatory Visit (INDEPENDENT_AMBULATORY_CARE_PROVIDER_SITE_OTHER): Payer: 59 | Admitting: Obstetrics & Gynecology

## 2020-04-17 ENCOUNTER — Other Ambulatory Visit: Payer: Self-pay

## 2020-04-17 VITALS — BP 126/84 | Ht 59.0 in | Wt 147.8 lb

## 2020-04-17 DIAGNOSIS — Z8541 Personal history of malignant neoplasm of cervix uteri: Secondary | ICD-10-CM

## 2020-04-17 DIAGNOSIS — R1031 Right lower quadrant pain: Secondary | ICD-10-CM | POA: Diagnosis not present

## 2020-04-17 DIAGNOSIS — Z01419 Encounter for gynecological examination (general) (routine) without abnormal findings: Secondary | ICD-10-CM | POA: Diagnosis not present

## 2020-04-17 DIAGNOSIS — E8940 Asymptomatic postprocedural ovarian failure: Secondary | ICD-10-CM

## 2020-04-17 DIAGNOSIS — Z1272 Encounter for screening for malignant neoplasm of vagina: Secondary | ICD-10-CM

## 2020-04-17 DIAGNOSIS — C539 Malignant neoplasm of cervix uteri, unspecified: Secondary | ICD-10-CM

## 2020-04-17 LAB — CBC
HCT: 41.6 % (ref 35.0–45.0)
Hemoglobin: 13.6 g/dL (ref 11.7–15.5)
MCH: 28.5 pg (ref 27.0–33.0)
MCHC: 32.7 g/dL (ref 32.0–36.0)
MCV: 87.2 fL (ref 80.0–100.0)
MPV: 10.6 fL (ref 7.5–12.5)
Platelets: 195 10*3/uL (ref 140–400)
RBC: 4.77 10*6/uL (ref 3.80–5.10)
RDW: 12.6 % (ref 11.0–15.0)
WBC: 5.9 10*3/uL (ref 3.8–10.8)

## 2020-04-17 LAB — COMPREHENSIVE METABOLIC PANEL
AG Ratio: 1.3 (calc) (ref 1.0–2.5)
ALT: 19 U/L (ref 6–29)
AST: 17 U/L (ref 10–35)
Albumin: 4.1 g/dL (ref 3.6–5.1)
Alkaline phosphatase (APISO): 114 U/L (ref 31–125)
BUN: 15 mg/dL (ref 7–25)
CO2: 29 mmol/L (ref 20–32)
Calcium: 9 mg/dL (ref 8.6–10.2)
Chloride: 106 mmol/L (ref 98–110)
Creat: 0.7 mg/dL (ref 0.50–1.10)
Globulin: 3.2 g/dL (calc) (ref 1.9–3.7)
Glucose, Bld: 90 mg/dL (ref 65–99)
Potassium: 4.3 mmol/L (ref 3.5–5.3)
Sodium: 140 mmol/L (ref 135–146)
Total Bilirubin: 0.5 mg/dL (ref 0.2–1.2)
Total Protein: 7.3 g/dL (ref 6.1–8.1)

## 2020-04-17 LAB — LIPID PANEL
Cholesterol: 179 mg/dL (ref <200)
HDL: 58 mg/dL (ref >=50)
LDL Cholesterol (Calc): 103 mg/dL (calc) — ABNORMAL HIGH
Non-HDL Cholesterol (Calc): 121 mg/dL (calc) (ref <130)
Total CHOL/HDL Ratio: 3.1 (calc) (ref <5.0)
Triglycerides: 87 mg/dL (ref <150)

## 2020-04-17 LAB — TSH: TSH: 1.89 mIU/L

## 2020-04-17 LAB — VITAMIN D 25 HYDROXY (VIT D DEFICIENCY, FRACTURES): Vit D, 25-Hydroxy: 19 ng/mL — ABNORMAL LOW (ref 30–100)

## 2020-04-17 NOTE — Progress Notes (Signed)
Krista Cross Aug 03, 1971 [id number]   History:    49 y.o. [id number] Married.  Has 3 grand-children.  RP:  Established patient presenting for annual gyn exam   HPI: AdenoCa of Cervix FIGO 1A2. S/P Robotic type III Radical laparoscopic Hysterectomy with BSO and Bilateral Pelvic Lymphadenectomy on 10/17/2015.  Seen by Dr Denman George 02/15/2019, Pap test at vaginal vault negative. Has had ongoing Rt lower back pain x 2 years, mildly increased recently.A repeat CT scan of Abdomen and Pelvis is scheduled through her Oncologist.  No urinary frequency.  No blood seen in urine.  No pain with urination.  No fever. No HRT x >2 years. Dryness with IC, will try coconut oil. Urine/BMs wnl. Breasts normal. BMI 29.85. Walking regularly.  Fasting health labs here today.  Past medical history,surgical history, family history and social history were all reviewed and documented in the EPIC chart.  Gynecologic History Patient's last menstrual period was 09/25/2015.  Obstetric History OB History  Gravida Para Term Preterm AB Living  5 4     1 4   SAB TAB Ectopic Multiple Live Births  1            # Outcome Date GA Lbr Len/2nd Weight Sex Delivery Anes PTL Lv  5 SAB           4 Para      CS-LTranv     3 Para      CS-LTranv     2 Para      Vag-Spont     1 Para      Vag-Spont        ROS: A ROS was performed and pertinent positives and negatives are included in the history.  GENERAL: No fevers or chills. HEENT: No change in vision, no earache, sore throat or sinus congestion. NECK: No pain or stiffness. CARDIOVASCULAR: No chest pain or pressure. No palpitations. PULMONARY: No shortness of breath, cough or wheeze. GASTROINTESTINAL: No abdominal pain, nausea, vomiting or diarrhea, melena or bright red blood per rectum. GENITOURINARY: No urinary frequency, urgency, hesitancy or dysuria. MUSCULOSKELETAL: No joint or muscle pain, no back pain, no recent trauma. DERMATOLOGIC: No rash, no itching, no  lesions. ENDOCRINE: No polyuria, polydipsia, no heat or cold intolerance. No recent change in weight. HEMATOLOGICAL: No anemia or easy bruising or bleeding. NEUROLOGIC: No headache, seizures, numbness, tingling or weakness. PSYCHIATRIC: No depression, no loss of interest in normal activity or change in sleep pattern.     Exam:   BP 126/84   Ht 4\' 11"  (1.499 m)   Wt 147 lb 12.8 oz (67 kg)   LMP 09/25/2015   BMI 29.85 kg/m   Body mass index is 29.85 kg/m.  General appearance : Well developed well nourished female. No acute distress HEENT: Eyes: no retinal hemorrhage or exudates,  Neck supple, trachea midline, no carotid bruits, no thyroidmegaly Lungs: Clear to auscultation, no rhonchi or wheezes, or rib retractions  Heart: Regular rate and rhythm, no murmurs or gallops Breast:Examined in sitting and supine position were symmetrical in appearance, no palpable masses or tenderness,  no skin retraction, no nipple inversion, no nipple discharge, no skin discoloration, no axillary or supraclavicular lymphadenopathy Abdomen: no palpable masses or tenderness, no rebound or guarding Extremities: no edema or skin discoloration or tenderness  Pelvic: Vulva: Normal             Vagina: No gross lesions or discharge.  Pap reflex done.  Cervix/Uterus absent  Adnexa  Without masses or tenderness  Anus: Normal  U/A: Yellow clear, protein negative, nitrite negative, white blood cells 6-10, red blood cells 10-20, bacteria few.  Pending urine culture.   Assessment/Plan:  49 y.o. female for annual exam   1. Encounter for Papanicolaou smear of vagina as part of routine gynecological examination Gynecologic exam status post total hysterectomy with BSO for adenocarcinoma of the cervix.  Pap reflex done at the vaginal vault.  Breast exam normal.  Last screening mammogram January 2021 was negative.  Fasting health labs here today.  Stable body mass index at 29.85.  Continue with walking and healthy  nutrition. - CBC - Comprehensive metabolic panel - Lipid panel - TSH - VITAMIN D 25 Hydroxy (Vit-D Deficiency, Fractures)  2. Adenocarcinoma of cervix ([place]) Followed by oncology.  A repeat CT scan of the abdomen and pelvis is scheduled through oncology for persistent and slightly increased right lower quadrant pain.  Pap reflex done at the vaginal vault today.  3. Surgical menopause Vaginal dryness with intercourse.  Will try coconut oil.  4. Right lower quadrant abdominal pain Scheduled for a repeat CT scan of the abdomen and pelvis through oncology.  Mild disturbance of urine analysis.  Will wait on urine culture to decide on treatment. - Urinalysis,Complete w/RFL Culture  Princess Bruins MD, 8:23 AM 04/17/2020

## 2020-04-17 NOTE — Addendum Note (Signed)
Addended by: Thurnell Garbe A on: 04/17/2020 09:22 AM   Modules accepted: Orders

## 2020-04-18 ENCOUNTER — Other Ambulatory Visit: Payer: Self-pay | Admitting: Anesthesiology

## 2020-04-18 MED ORDER — VITAMIN D (ERGOCALCIFEROL) 1.25 MG (50000 UNIT) PO CAPS: 50000.0000 [IU] | ORAL_CAPSULE | ORAL | 0 refills | Status: DC

## 2020-04-19 LAB — PAP IG W/ RFLX HPV ASCU

## 2020-04-19 LAB — URINALYSIS, COMPLETE W/RFL CULTURE
Bilirubin Urine: NEGATIVE
Glucose, UA: NEGATIVE
Hyaline Cast: NONE SEEN /LPF
Ketones, ur: NEGATIVE
Nitrites, Initial: NEGATIVE
Protein, ur: NEGATIVE
Specific Gravity, Urine: 1.025 (ref 1.001–1.03)
pH: 6 (ref 5.0–8.0)

## 2020-04-19 LAB — URINE CULTURE
MICRO NUMBER:: 10830695
Result:: NO GROWTH
SPECIMEN QUALITY:: ADEQUATE

## 2020-04-19 LAB — CULTURE INDICATED

## 2020-07-29 IMAGING — CT CT ABD-PELV W/ CM
2 of 5 series · 16 of 46 positions shown, 18 images · IV contrast (OMNIPAQUE)
Comparison: 08/28/2017

CLINICAL DATA: New lower abdominal pain and dyspareunia. Previous
hysterectomy for cervical carcinoma.

EXAM:
CT ABDOMEN AND PELVIS WITH CONTRAST
TECHNIQUE: Multidetector CT imaging of the abdomen and pelvis was performed
using the standard protocol following bolus administration of
intravenous contrast.
CONTRAST:  100mL OMNIPAQUE IOHEXOL 300 MG/ML  SOLN

[Series 2: axial st · axial · 0.70mm/px · z∈[+1146,+1546]mm · 13 of 94 slices shown, 15 images]
[im 7/94  soft-tissue]
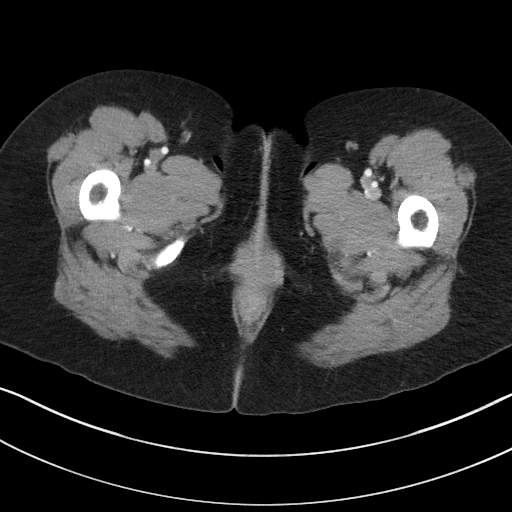
[im 7/94  bone]
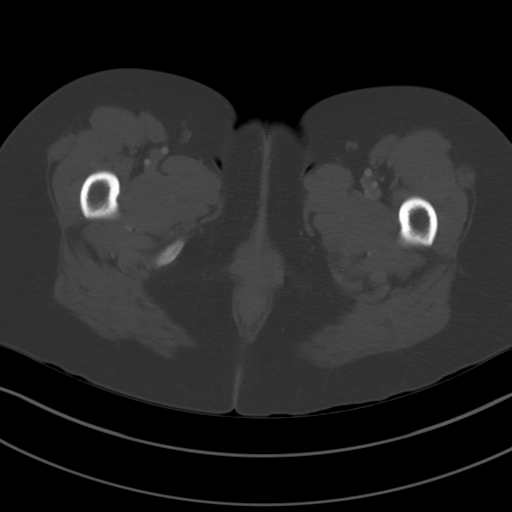
[im 14/94  soft-tissue]
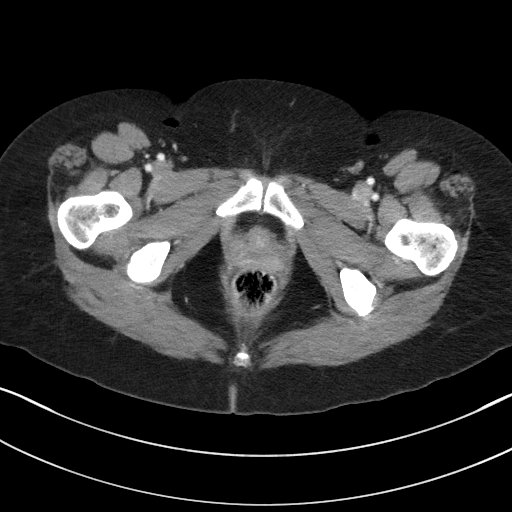
[im 20/94  soft-tissue]
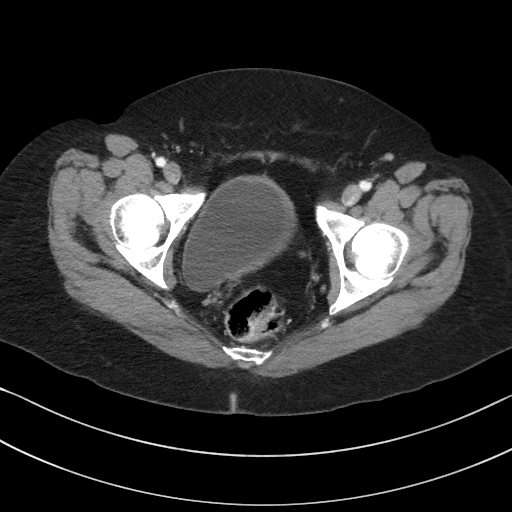
[im 27/94  soft-tissue]
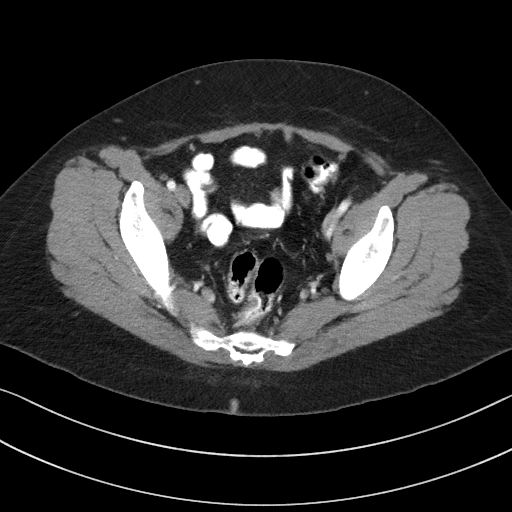
[im 34/94  soft-tissue]
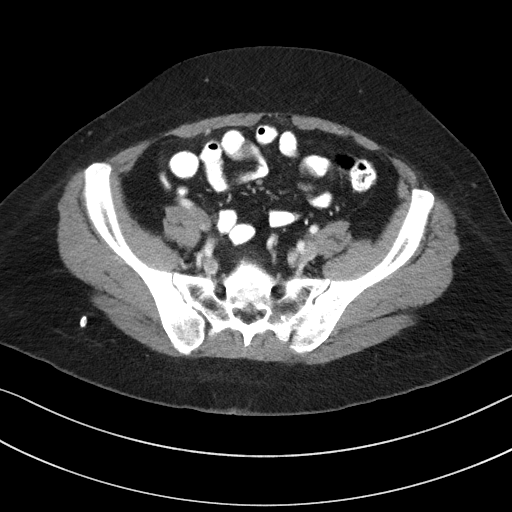
[im 40/94  soft-tissue]
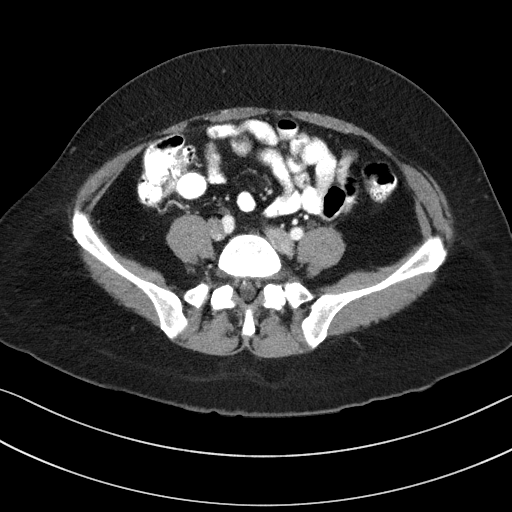
[im 47/94  soft-tissue]
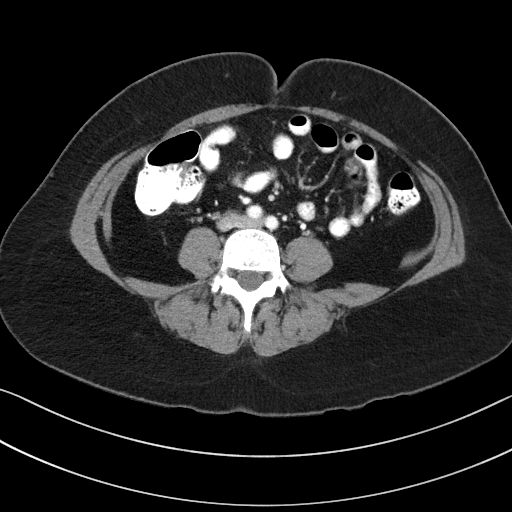
[im 54/94  soft-tissue]
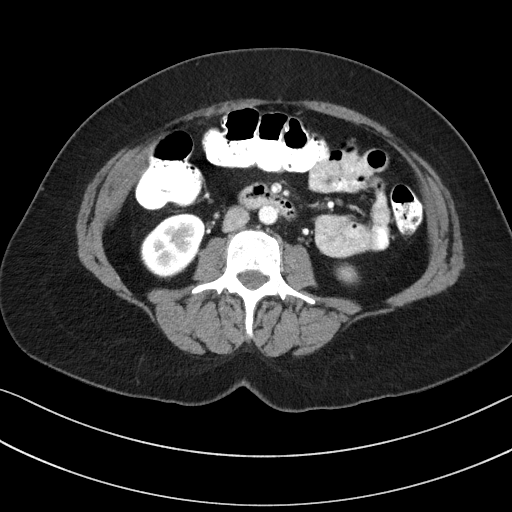
[im 60/94  soft-tissue]
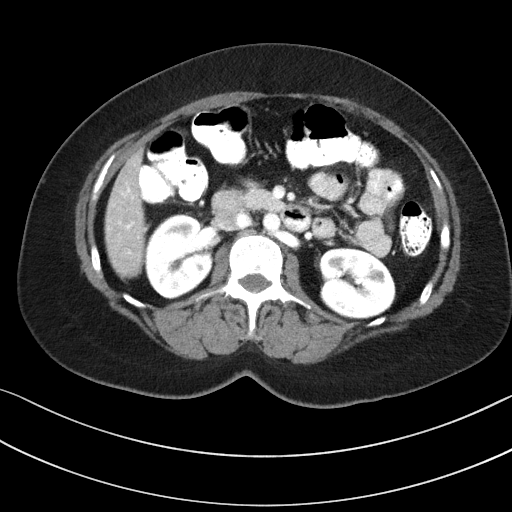
[im 60/94  bone]
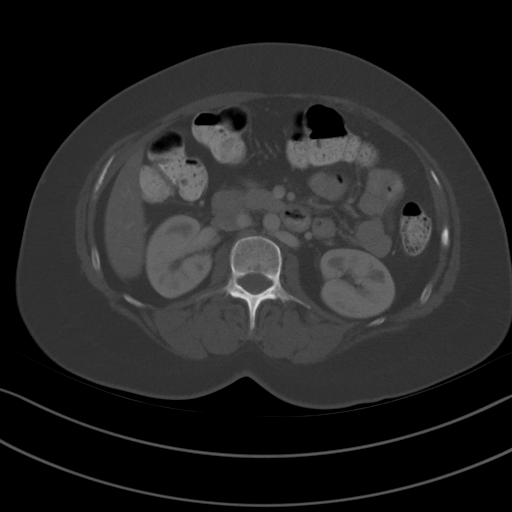
[im 67/94  soft-tissue]
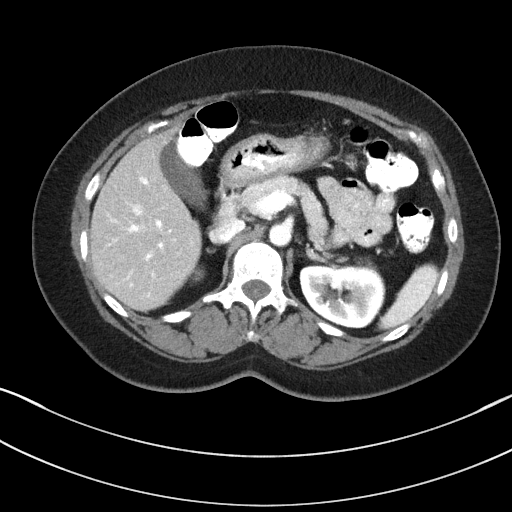
[im 74/94  soft-tissue]
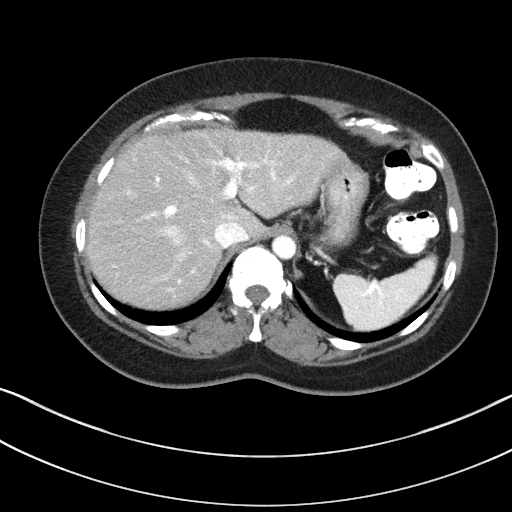
[im 80/94  soft-tissue]
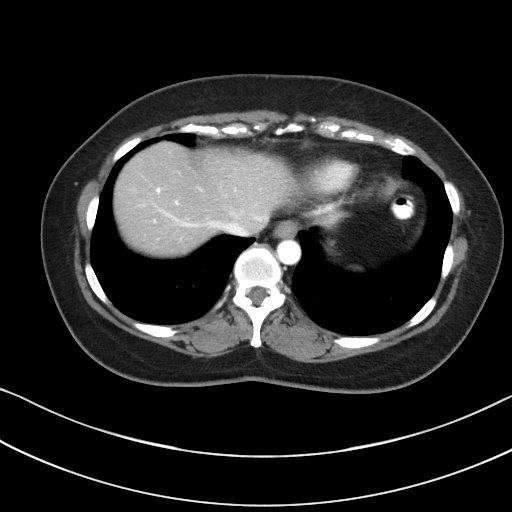
[im 87/94  soft-tissue]
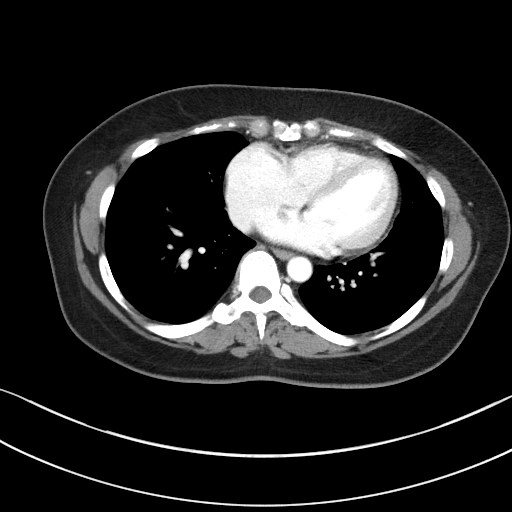

[Series 5: coronal st · coronal · 0.68mm/px · 3 of 101 slices shown]
[im 34/101  soft-tissue]
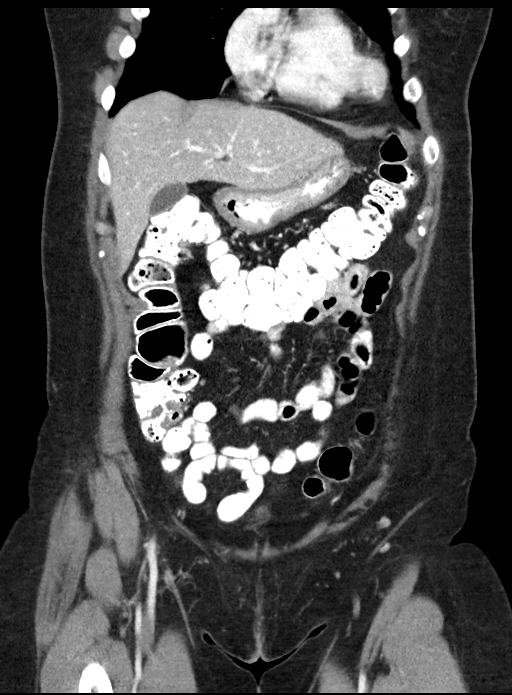
[im 45/101  soft-tissue]
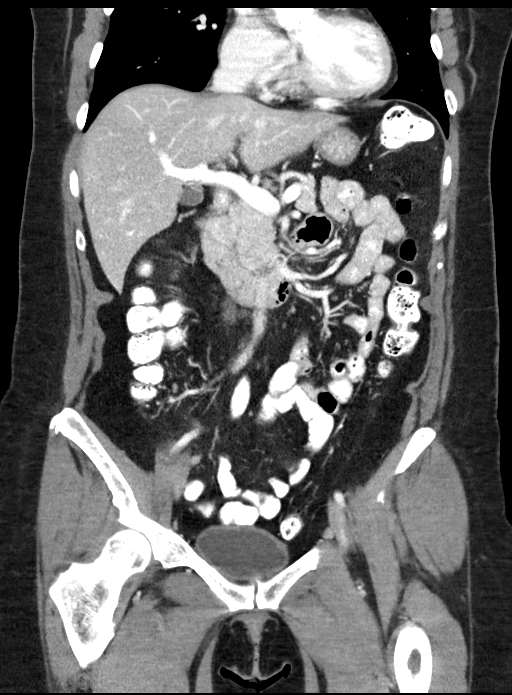
[im 56/101  soft-tissue]
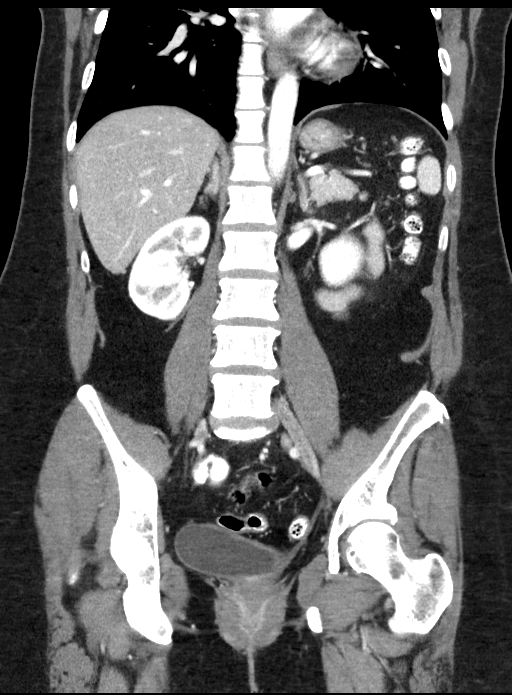

[16 of 46 positions shown; findings below may reference images not displayed]

FINDINGS: Lower Chest: No acute findings.

Hepatobiliary: No hepatic masses identified. Mild diffuse hepatic
steatosis. Gallbladder is unremarkable. No evidence of biliary
ductal dilatation.

Pancreas:  No mass or inflammatory changes.

Spleen: Within normal limits in size and appearance.

Adrenals/Urinary Tract: No masses identified. No evidence of
hydronephrosis.

Stomach/Bowel: No evidence of obstruction, inflammatory process or
abnormal fluid collections. Normal appendix visualized.

Vascular/Lymphatic: No pathologically enlarged lymph nodes. No
abdominal aortic aneurysm.

Reproductive: Prior hysterectomy noted. Adnexal regions are
unremarkable in appearance.

Other:  None.

Musculoskeletal:  No suspicious bone lesions identified.
IMPRESSION: 1. No acute findings. No evidence of recurrent or metastatic
carcinoma.
2. Mild hepatic steatosis.

## 2020-11-07 ENCOUNTER — Other Ambulatory Visit: Payer: Self-pay

## 2020-11-07 ENCOUNTER — Encounter: Payer: Self-pay | Admitting: Gynecologic Oncology

## 2020-11-07 ENCOUNTER — Inpatient Hospital Stay: Payer: 59 | Attending: Gynecologic Oncology | Admitting: Gynecologic Oncology

## 2020-11-07 VITALS — BP 91/60 | HR 80 | Temp 97.1°F | Resp 16 | Ht 59.0 in | Wt 146.4 lb

## 2020-11-07 DIAGNOSIS — Z8541 Personal history of malignant neoplasm of cervix uteri: Secondary | ICD-10-CM | POA: Diagnosis not present

## 2020-11-07 DIAGNOSIS — Z90722 Acquired absence of ovaries, bilateral: Secondary | ICD-10-CM | POA: Diagnosis not present

## 2020-11-07 DIAGNOSIS — Z90710 Acquired absence of both cervix and uterus: Secondary | ICD-10-CM | POA: Insufficient documentation

## 2020-11-07 DIAGNOSIS — C539 Malignant neoplasm of cervix uteri, unspecified: Secondary | ICD-10-CM

## 2020-11-07 NOTE — Progress Notes (Signed)
Follow Up Note: Gyn-Onc  Krista Cross 50 y.o. female  CC:  Chief Complaint  Patient presents with  . Cervical cancer, FIGO stage IB1 [facility])   Assessment/Plan:  50 year old female with a history of stage IB1 adenocarcinoma of the cervix, s/pvrobotic-assisted type III radical laparoscopic hysterectomy with bilateral salpingoophorectomy and bilateral pelvic lymphadenectomy on 10/17/15.   Low risk factors therefore no adjuvant therapy prescribed postop.  No evidence of disease on today's exam.  I discussed the risk for recurrence and typical symptoms that are manifested with this. I informed the patient to notify us if these develop and see [place] prior to her scheduled appointment.  Follow-up: Ms Krista Cross has completed follow-up at the [facility] as she has remained disease free since treatment for >5 years.  I recommend she continue to follow up with Dr Dellis Filbert for annual wellness care. She requires a total of 20 years post-treatment surveillance for the development of new HPV related disease (such as vaginal dysplasia). ASCCP guidelines can be followed for this (eg q 5 yearly paps if HPV negative and cytology normal).   HPI: Krista Cross is a 51 year old female initially seen in consultation at the request of Dr Toney Rakes for villoglandular adenocarcinoma of the endocervix . The patient had an AGUS pap smear in June, 2016 with high risk HPV detected. Colposcopy and directed biopsies were performed on 08/03/15 which revealed a normal appearing cervix. The endometrium was sampled and showed proliferative endometrium with simple hyperplasia (no atypia), and the endocervical biopsy revealed adenocarcinoma with villoglandular endometrioid features.   On 08/31/15, she underwent a cold knife conization of the cervix.  Final pathology revealed:1. Cervix, cone ADENOCARCINOMA IN SITU, ENDOMETRIOID TYPE WITH SUPERFICIAL INVASION THE CARCINOMA IS1.3 CM IN LENGTH, 0.6 CM IN THICKNESS.  THE  ENDOCERVICAL RESECTION MARGIN IS POSITIVE FROM 12 TO 6 O'CLOCK.  NO LYMPHOVASCULAR INVASION IDENTIFIED 2. Endocervix, curettage, post cone:  DETACHED SMALL FRAGMENT OF ADENOCARCINOMA   Based on the cone findings, it was recommended she proceed with a radical hysterectomy.  On 10/17/15, she underwent a Robotic-assisted type III radical laparoscopic hysterectomy with bilateral salpingoophorectomy and bilateral pelvic lymphadenectomy.  Final pathology revealed a stage IB1 adenocarcinoma of the cervix:  Diagnosis 1. Lymph node, sentinel, biopsy, right external iliac - ONE BENIGN LYMPH NODE WITH NO TUMOR SEEN (0/1). 2. Lymph node, sentinel, biopsy, right external iliac - ONE BENIGN LYMPH NODE WITH NO TUMOR SEEN (0/1). 3. Lymph node, sentinel, biopsy, right obturator - ONE BENIGN LYMPH NODE WITH NO TUMOR SEEN (0/1). 4. Lymph nodes, regional resection, left pelvic - EIGHT BENIGN LYMPH NODES WITH NO TUMOR SEEN (0/8). 5. Uterus +/- tubes/ovaries, neoplastic - CERVIX WITH TWO RESIDUAL FOCI OF ADENOCARCINOMA IN SITU, EACH MEASURING LESS THAN 0.2 CM IN GREATEST DIMENSION. - CERVIX ALSO DEMONSTRATES FOCAL HIGH GRADE SQUAMOUS INTRAEPITHELIAL LESION, CIN-II (MODERATE DYSPLASIA). - ECTOCERVICAL MARGIN IS NEGATIVE FOR DYSPLASIA, ATYPIA OR MALIGNANCY. - SEE COMMENT. ADDITIONAL FINDINGS: - SECRETORY PATTERN ENDOMETRIUM WITH UNDERLYING BENIGN MYOMETRIUM. - BENIGN FALLOPIAN TUBES WITH EVIDENCE OF TUBAL LIGATION AND BENIGN PARATUBAL CYST FORMATION ON THE LEFT. - BENIGN BILATERAL OVARIES WITH HEMORRHAGIC CORPUS LUTEUM FORMATION ON THE LEFT. - ASIDE FROM THE FINDINGS IN THE CERVIX, THERE IS NO ATYPIA OR MALIGNANCY PRESENT IN THE REMAINDER OF THE SPECIMEN.    Last pap in June 2020 was normal with negative high risk HPV.  CT scan in December, 2018 (for abdominal pain) showed no lesions, masses, recurrence, or explanation for the pain.  CT scan was  performed again in January, 2020 for the same abdominal pain.  This again was unremarkable.    Interval History: She presents today with no complaints.  Pap was negative in August, 2021.   Review of Systems Constitutional:+ hot flashes. Cardiovascular: No chest pain, shortness of breath, or edema.  Pulmonary: No cough or wheeze.  Gastrointestinal: No nausea, vomiting, or diarrhea. No bright red blood per rectum or change in bowel movement.  Genitourinary: No frequency, urgency, or dysuria. No vaginal bleeding or discharge.  Musculoskeletal: no pain. Neurologic: no headache  Psychology: No depression, anxiety, or insomnia.  Current Meds:  Outpatient Encounter Medications as of 11/07/2020  Medication Sig  . Multiple Vitamins-Minerals (MULTIVITAMIN WITH MINERALS) tablet Take 1 tablet by mouth daily.  . [DISCONTINUED] Vitamin D, Ergocalciferol, (DRISDOL) 1.25 MG (50000 UNIT) CAPS capsule Take 1 capsule (50,000 Units total) by mouth every 7 (seven) days.   No facility-administered encounter medications on file as of 11/07/2020.    Allergy:  Allergies  Allergen Reactions  . Ibuprofen Swelling    Social Hx:   Social History   Socioeconomic History  . Marital status: Married    Spouse name: Not on file  . Number of children: Not on file  . Years of education: Not on file  . Highest education level: Not on file  Occupational History  . Not on file  Tobacco Use  . Smoking status: Never Smoker  . Smokeless tobacco: Never Used  Vaping Use  . Vaping Use: Never used  Substance and Sexual Activity  . Alcohol use: No  . Drug use: No  . Sexual activity: Yes    Partners: Male    Birth control/protection: Surgical    Comment: 1st intercourse- 59, partner- 1, married- 41 yrs   Other Topics Concern  . Not on file  Social History Narrative  . Not on file   Social Determinants of Health   Financial Resource Strain: Not on file  Food Insecurity: Not on file  Transportation Needs: Not on file  Physical Activity: Not on file  Stress: Not on  file  Social Connections: Not on file  Intimate Partner Violence: Not on file    Past Surgical Hx:  Past Surgical History:  Procedure Laterality Date  . CERVICAL CONIZATION W/BX N/A 08/31/2015   Procedure: CONIZATION CERVIX WITH BIOPSY;  Surgeon: Everitt Amber, MD;  Location: [facility];  Service: Gynecology;  Laterality: N/A;  . CESAREAN SECTION  07-26-2003  &  12-28-2007   Bilateral Tubal Ligation with last one  . ENDOVENOUS ABLATION SAPHENOUS VEIN W/ LASER Right 05-05-2014   EVLA RIGHT GREATER SAPHENOUS VEIN  BY TODD EARLY MD  . ROBOTIC ASSISTED TOTAL HYSTERECTOMY WITH BILATERAL SALPINGO OOPHERECTOMY Bilateral 10/17/2015   Procedure: XI ROBOTIC ASSISTED TYPE III RADICAL TOTAL HYSTERECTOMY WITH BILATERAL SALPINGECTOMY OOPHORECTOMY WITH SENTINEL LYMPH NODE BIOPSY;  Surgeon: Everitt Amber, MD;  Location: [facility];  Service: Gynecology;  Laterality: Bilateral;    Past Medical Hx:  Past Medical History:  Diagnosis Date  . Cervical adenocarcinoma ([place])   . History of abnormal cervical Pap smear    w/ cryoablation in 2000  . Varicose veins     Family Hx:  Family History  Problem Relation Age of Onset  . Hyperlipidemia Mother   . Diabetes Mother   . Varicose Veins Sister   . Lupus Sister   . Cancer Neg Hx     Vitals:  Blood pressure 91/60, pulse 80, temperature (!) 97.1 F (36.2 C), temperature  source Tympanic, resp. rate 16, height 4\' 11"  (1.499 m), weight 146 lb 6.4 oz (66.4 kg), last menstrual period 09/25/2015.  Physical Exam:  General: Well developed, well nourished female in no acute distress. Alert and oriented x 3.  Neck: Supple without any enlargements.  Lymph node survey: No cervical, supraclavicular, or inguinal adenopathy.  Cardiovascular: Regular rate and rhythm. S1 and S2 normal.  Lungs: Clear to auscultation bilaterally. No wheezes/crackles/rhonchi noted.  Skin: No rashes or lesions present. Back: No CVA tenderness.  Abdomen: Abdomen soft, non-tender  and obese. Active bowel sounds in all quadrants. No evidence of a fluid wave or abdominal masses. Incisions soft. Genito-urinary: normal EFG. Normal vagina. Vaginal cuff smooth. No lesions. No blood.  Extremities: No bilateral cyanosis, edema, or clubbing.   Thereasa Solo, MD 11/07/2020, 3:18 PM

## 2020-11-07 NOTE — Patient Instructions (Signed)
Congratulations! You have graduated from your cancer surveillance visits. Dr Denman George recommends that you follow-up with Dr Dellis Filbert once a year for well woman exams. If you have symptoms that concern you for your cervical cancer, please call Dr Serita Grit office at [phone].

## 2021-01-24 ENCOUNTER — Telehealth: Payer: Self-pay

## 2021-01-24 NOTE — Telephone Encounter (Signed)
Called patient to set up a new patient appointment with Dr. [place].   No answer and could not leave voicemail message.

## 2021-02-28 ENCOUNTER — Ambulatory Visit (INDEPENDENT_AMBULATORY_CARE_PROVIDER_SITE_OTHER): Payer: 59 | Admitting: Family Medicine

## 2021-02-28 ENCOUNTER — Other Ambulatory Visit: Payer: Self-pay

## 2021-02-28 ENCOUNTER — Encounter: Payer: Self-pay | Admitting: Family Medicine

## 2021-02-28 VITALS — BP 128/80 | HR 82 | Temp 98.1°F | Resp 16 | Ht 59.0 in | Wt 147.2 lb

## 2021-02-28 DIAGNOSIS — G8929 Other chronic pain: Secondary | ICD-10-CM | POA: Diagnosis not present

## 2021-02-28 DIAGNOSIS — Z1211 Encounter for screening for malignant neoplasm of colon: Secondary | ICD-10-CM | POA: Diagnosis not present

## 2021-02-28 DIAGNOSIS — R109 Unspecified abdominal pain: Secondary | ICD-10-CM

## 2021-02-28 DIAGNOSIS — E559 Vitamin D deficiency, unspecified: Secondary | ICD-10-CM

## 2021-02-28 LAB — URINALYSIS, ROUTINE W REFLEX MICROSCOPIC
Bilirubin Urine: NEGATIVE
Ketones, ur: NEGATIVE
Nitrite: NEGATIVE
Specific Gravity, Urine: 1.015 (ref 1.000–1.030)
Total Protein, Urine: NEGATIVE
Urine Glucose: NEGATIVE
Urobilinogen, UA: 0.2 (ref 0.0–1.0)
pH: 8.5 — AB (ref 5.0–8.0)

## 2021-02-28 NOTE — Progress Notes (Signed)
HPI: Krista Cross is a 50 y.o. female, who is here today to re-establish care. She was last seen on 06/06/17. Last gyn preventive visit: 04/17/20, she sees Dr Dellis Filbert.  Chronic medical problems: Stage IB1 cervix adenocarcinoma, s/p robotic-assisted type III radical hysterectomy and bilateral salpingoophorectomy+ bilateral pelvic  lymphadenectomy on 10/17/15.Vit D deficiency.  Concerns today: Right flank pain. Problem has been going on for over a year. Right-sided waist pain radiated to post rib cage. Daily. Intermittent, 5/10. Exacerbated by movement after prolonged rest but alleviated after a few minutes of movement. No fever,abnormal wt loss,night sweats,skin rash,numbness,or tingling.  States that she had abdominal CT done due to this problem. 09/10/2018 abdominal/pelvic CT: 1. No acute findings. No evidence of recurrent or metastatic carcinoma. 2. Mild hepatic steatosis.  Lab Results  Component Value Date   ALT 19 04/17/2020   AST 17 04/17/2020   ALKPHOS 115 11/05/2016   BILITOT 0.5 04/17/2020    She recently traveled to [place] on, when she had a renal US and was told she has kidney stone, "sludge", he was recommended to increase fluid intake. States that she has had blood in urine before, has been told, no gross hematuria or urinary symptom.  Negative for abdominal pain, nausea, vomiting, changes in bowel habits, blood in stool or melena.  Vitamin D deficiency: She is not on vitamin D supplementation, she thought a prescription was going to be sent to her pharmacy. Last 25 OH vitamin D on 04/17/20 was 19.  Review of Systems  Constitutional:  Negative for activity change, appetite change, fatigue and fever.  HENT:  Negative for mouth sores, nosebleeds and sore throat.   Eyes:  Negative for redness and visual disturbance.  Respiratory:  Negative for cough, shortness of breath and wheezing.   Cardiovascular:  Negative for chest pain, palpitations and leg  swelling.  Genitourinary:  Negative for decreased urine volume, dysuria and hematuria.  Neurological:  Negative for syncope, weakness and headaches.  Hematological:  Negative for adenopathy. Does not bruise/bleed easily.  Psychiatric/Behavioral:  Negative for confusion and sleep disturbance.   Rest see pertinent positives and negatives per HPI.  Current Outpatient Medications on File Prior to Visit  Medication Sig Dispense Refill   Multiple Vitamins-Minerals (MULTIVITAMIN WITH MINERALS) tablet Take 1 tablet by mouth daily.     No current facility-administered medications on file prior to visit.   Past Medical History:  Diagnosis Date   Cervical adenocarcinoma ([place])    History of abnormal cervical Pap smear    w/ cryoablation in 2000   Varicose veins    Allergies  Allergen Reactions   Ibuprofen Swelling    Family History  Problem Relation Age of Onset   Hyperlipidemia Mother    Diabetes Mother    Varicose Veins Sister    Lupus Sister    Cancer Neg Hx     Social History   Socioeconomic History   Marital status: Married    Spouse name: Not on file   Number of children: Not on file   Years of education: Not on file   Highest education level: Not on file  Occupational History   Not on file  Tobacco Use   Smoking status: Never   Smokeless tobacco: Never  Vaping Use   Vaping Use: Never used  Substance and Sexual Activity   Alcohol use: No   Drug use: No   Sexual activity: Yes    Partners: Male    Birth control/protection: Surgical  Comment: 1st intercourse- 89, partner- 1, married- 38 yrs   Other Topics Concern   Not on file  Social History Narrative   Not on file   Social Determinants of Health   Financial Resource Strain: Not on file  Food Insecurity: Not on file  Transportation Needs: Not on file  Physical Activity: Not on file  Stress: Not on file  Social Connections: Not on file    Vitals:   02/28/21 0743  BP: 128/80  Pulse: 82  Resp: 16   Temp: 98.1 F (36.7 C)  SpO2: 99%   Body mass index is 29.74 kg/m.  Physical Exam Vitals and nursing note reviewed.  Constitutional:      General: She is not in acute distress.    Appearance: She is well-developed.  HENT:     Head: Normocephalic and atraumatic.     Mouth/Throat:     Mouth: Mucous membranes are moist.     Pharynx: Oropharynx is clear.  Eyes:     Conjunctiva/sclera: Conjunctivae normal.  Cardiovascular:     Rate and Rhythm: Normal rate and regular rhythm.     Pulses:          Dorsalis pedis pulses are 2+ on the right side and 2+ on the left side.     Heart sounds: No murmur heard. Pulmonary:     Effort: Pulmonary effort is normal. No respiratory distress.     Breath sounds: Normal breath sounds.  Abdominal:     Palpations: Abdomen is soft. There is no hepatomegaly or mass.     Tenderness: There is no abdominal tenderness.  Musculoskeletal:     Cervical back: No tenderness or bony tenderness.     Thoracic back: No tenderness or bony tenderness.     Lumbar back: No bony tenderness.       Back:  Lymphadenopathy:     Cervical: No cervical adenopathy.  Skin:    General: Skin is warm.     Findings: No erythema or rash.  Neurological:     General: No focal deficit present.     Mental Status: She is alert and oriented to person, place, and time.     Cranial Nerves: No cranial nerve deficit.     Gait: Gait normal.  Psychiatric:     Comments: Well groomed, good eye contact.   ASSESSMENT AND PLAN:  Krista Cross was seen today for establish care.  Diagnoses and all orders for this visit: Orders Placed This Encounter  Procedures   DG Abd 1 View   Urinalysis, Routine w reflex microscopic   Ambulatory referral to Gastroenterology    Chronic right flank pain We discussed possible etiologies. It seems to be musculoskeletal. Hx and examination today does not suggest a serious process. ? Nephrolithiasis, KUB ordered today. Instructed about earning  signs. Topical icy hot or asper cream may help.  Vitamin D deficiency, unspecified OTC Vit D 2000 U daily recommended. We can re-check with next blood work.  Colon cancer screening -     Ambulatory referral to Gastroenterology   Return if symptoms worsen or fail to improve.  Necia Kamm G. Martinique, MD  [facility]. [place] office.

## 2021-02-28 NOTE — Patient Instructions (Addendum)
A few things to remember from today's visit:   Chronic right flank pain - Plan: Urinalysis, Routine w reflex microscopic, DG Abd 1 View  Vitamin D deficiency, unspecified   El dolor al lado derecho parece muscular. Trate icy hot o asper cream en el rea afectada y mire si el dolor mejora. Piedras en los riones produce otro tipo de Social research officer, government. Vamos a hacer una radiografa para evaluar por piedras. Empiece vit D 2000 U al dia.  Si puede trigame una copia de los examines que se hizo en [place].

## 2021-08-01 ENCOUNTER — Ambulatory Visit (INDEPENDENT_AMBULATORY_CARE_PROVIDER_SITE_OTHER): Payer: 59 | Admitting: Orthopaedic Surgery

## 2021-08-01 ENCOUNTER — Other Ambulatory Visit: Payer: Self-pay

## 2021-08-01 ENCOUNTER — Ambulatory Visit (INDEPENDENT_AMBULATORY_CARE_PROVIDER_SITE_OTHER): Payer: 59

## 2021-08-01 DIAGNOSIS — M79641 Pain in right hand: Secondary | ICD-10-CM | POA: Diagnosis not present

## 2021-08-01 MED ORDER — MUPIROCIN 2 % EX OINT: 1.0000 "application " | TOPICAL_OINTMENT | Freq: Two times a day (BID) | CUTANEOUS | 2 refills | Status: DC

## 2021-08-01 MED ORDER — AMOXICILLIN-POT CLAVULANATE 875-125 MG PO TABS: 1.0000 | ORAL_TABLET | Freq: Two times a day (BID) | ORAL | 0 refills | Status: DC

## 2021-08-01 NOTE — Progress Notes (Signed)
Office Visit Note   Patient: Krista Cross           Date of Birth: May 25, 1971           MRN: [id number] Visit Date: 08/01/2021              Requested by: Martinique, Betty G, MD [address] [place],  [place] [address] PCP: Martinique, Betty G, MD   Assessment & Plan: Visit Diagnoses:  1. Pain in right hand     Plan: Patient is a 50 year old female who is on lives who comes in for injury to the right hand that occurred yesterday.  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  This caused a regular laceration to the dorsal aspect of the index metacarpal head.  She endorses pain and swelling.  Denies any numbness.  The right hand shows a regular circular laceration that is superficial to the dorsum of the index metacarpal head.  Intact EDC and EIP function.  Intact flexor function.  Sensation intact.  Capillary refill normal.  The x-rays are negative and unremarkable.  I recommend Augmentin and mupirocin.  She will see her PCP in 2 days about getting a tetanus booster.  Signs and symptoms were reviewed with the patient to look out for in regards to infection or worsening.  Otherwise would like to see her back in 2 weeks for recheck.  Follow-Up Instructions: Return in about 2 weeks (around 08/15/2021).   Orders:  Orders Placed This Encounter  Procedures   XR Hand Complete Right   Meds ordered this encounter  Medications   amoxicillin-clavulanate (AUGMENTIN) 875-125 MG tablet    Sig: Take 1 tablet by mouth 2 (two) times daily.    Dispense:  14 tablet    Refill:  0   mupirocin ointment (BACTROBAN) 2 %    Sig: Apply 1 application topically 2 (two) times daily.    Dispense:  22 g    Refill:  2      Procedures: No procedures performed   Clinical Data: No additional findings.   Subjective: Chief Complaint  Patient presents with   Right Hand - Injury    DOI:07/31/21    HPI  Review of Systems   Objective: Vital Signs: LMP  09/25/2015   Physical Exam  Ortho Exam  Specialty Comments:  No specialty comments available.  Imaging: XR Hand Complete Right  Result Date: 08/01/2021 No acute or structural abnormalities.    PMFS History: Patient Active Problem List   Diagnosis Date Noted   Cervical cancer, FIGO stage IB1 ([place]) 02/09/2018   Hypertrophy of tonsil-Right 06/06/2017   Surgical menopause 05/22/2016   Cervical cancer ([place]) 10/17/2015   Cervical cancer, FIGO stage IA2 ([place]) 08/18/2015   AGUS favor benign 07/24/2015   Vitamin D deficiency 07/06/2015   Atypical glandular cells of undetermined significance (AGUS) on cervical Pap smear 07/06/2015   Cervical cancer screening 02/27/2015   Varicose veins of bilateral lower extremities with other complications 21/30/8657   Routine general medical examination at a health care facility 12/07/2013   TINEA CORPORIS 10/27/2009   SPRAIN&STRAIN UNSPEC SITE SHOULDER&UPPER ARM 10/27/2009   MICROSCOPIC HEMATURIA 09/19/2009   Past Medical History:  Diagnosis Date   Cervical adenocarcinoma ([place])    History of abnormal cervical Pap smear    w/ cryoablation in 2000   Varicose veins     Family History  Problem Relation Age of Onset   Hyperlipidemia Mother    Diabetes  Mother    Varicose Veins Sister    Lupus Sister    Cancer Neg Hx     Past Surgical History:  Procedure Laterality Date   CERVICAL CONIZATION W/BX N/A 08/31/2015   Procedure: CONIZATION CERVIX WITH BIOPSY;  Surgeon: Everitt Amber, MD;  Location: [facility];  Service: Gynecology;  Laterality: N/A;   CESAREAN SECTION  07-26-2003  &  12-28-2007   Bilateral Tubal Ligation with last one   ENDOVENOUS ABLATION SAPHENOUS VEIN W/ LASER Right 05-05-2014   EVLA RIGHT GREATER SAPHENOUS VEIN  BY TODD EARLY MD   ROBOTIC ASSISTED TOTAL HYSTERECTOMY WITH BILATERAL SALPINGO OOPHERECTOMY Bilateral 10/17/2015   Procedure: XI ROBOTIC ASSISTED TYPE III RADICAL TOTAL HYSTERECTOMY WITH BILATERAL  SALPINGECTOMY OOPHORECTOMY WITH SENTINEL LYMPH NODE BIOPSY;  Surgeon: Everitt Amber, MD;  Location: [facility];  Service: Gynecology;  Laterality: Bilateral;   Social History   Occupational History   Not on file  Tobacco Use   Smoking status: Never   Smokeless tobacco: Never  Vaping Use   Vaping Use: Never used  Substance and Sexual Activity   Alcohol use: No   Drug use: No   Sexual activity: Yes    Partners: Male    Birth control/protection: Surgical    Comment: 1st intercourse- 25, partner- 24, married- 72 yrs

## 2021-08-03 ENCOUNTER — Ambulatory Visit (INDEPENDENT_AMBULATORY_CARE_PROVIDER_SITE_OTHER): Payer: 59 | Admitting: Obstetrics & Gynecology

## 2021-08-03 ENCOUNTER — Encounter: Payer: Self-pay | Admitting: Obstetrics & Gynecology

## 2021-08-03 ENCOUNTER — Other Ambulatory Visit (HOSPITAL_COMMUNITY)
Admission: RE | Admit: 2021-08-03 | Discharge: 2021-08-03 | Disposition: A | Discharge status: Home / Self Care | Payer: 59 | Source: Ambulatory Visit | Attending: Obstetrics & Gynecology | Admitting: Obstetrics & Gynecology

## 2021-08-03 ENCOUNTER — Other Ambulatory Visit: Payer: Self-pay

## 2021-08-03 VITALS — BP 104/70 | HR 74 | Resp 16 | Ht 58.75 in | Wt 147.0 lb

## 2021-08-03 DIAGNOSIS — Z01419 Encounter for gynecological examination (general) (routine) without abnormal findings: Secondary | ICD-10-CM | POA: Diagnosis present

## 2021-08-03 DIAGNOSIS — Z1272 Encounter for screening for malignant neoplasm of vagina: Secondary | ICD-10-CM

## 2021-08-03 DIAGNOSIS — E8940 Asymptomatic postprocedural ovarian failure: Secondary | ICD-10-CM

## 2021-08-03 DIAGNOSIS — C539 Malignant neoplasm of cervix uteri, unspecified: Secondary | ICD-10-CM

## 2021-08-03 DIAGNOSIS — Z23 Encounter for immunization: Secondary | ICD-10-CM | POA: Diagnosis not present

## 2021-08-03 NOTE — Progress Notes (Signed)
Krista Cross 07-27-1971 [id number]   History:    50 y.o. [id number] Married.  Has 3 grand-children.   RP:  Established patient presenting for annual gyn exam    HPI: AdenoCa of Cervix FIGO 1A2.  S/P Robotic type III Radical laparoscopic Hysterectomy with BSO and Bilateral Pelvic Lymphadenectomy on 10/17/2015. CT scan of Abdomen and Pelvis Neg 09/2018.  Discharged from Dr Denman George at 5 yrs.  All Paps Neg since surgery.  No pain with IC with coconut oil.  Urine/BMs wnl.  Breasts normal. Overdue for Mammo, schedule now.  BMI 29.94.  Walking regularly. Fasting health labs here today.  Will schedule first screening Colonoscopy now.   Past medical history,surgical history, family history and social history were all reviewed and documented in the EPIC chart.  Gynecologic History Patient's last menstrual period was 09/25/2015.  Obstetric History OB History  Gravida Para Term Preterm AB Living  _0 SAB IAB Ectopic Multiple Live Births  1            # Outcome Date GA Lbr Len/2nd Weight Sex Delivery Anes PTL Lv  5 SAB           4 Para      CS-LTranv     3 Para      CS-LTranv     2 Para      Vag-Spont     1 Para      Vag-Spont        ROS: A ROS was performed and pertinent positives and negatives are included in the history.  GENERAL: No fevers or chills. HEENT: No change in vision, no earache, sore throat or sinus congestion. NECK: No pain or stiffness. CARDIOVASCULAR: No chest pain or pressure. No palpitations. PULMONARY: No shortness of breath, cough or wheeze. GASTROINTESTINAL: No abdominal pain, nausea, vomiting or diarrhea, melena or bright red blood per rectum. GENITOURINARY: No urinary frequency, urgency, hesitancy or dysuria. MUSCULOSKELETAL: No joint or muscle pain, no back pain, no recent trauma. DERMATOLOGIC: No rash, no itching, no lesions. ENDOCRINE: No polyuria, polydipsia, no heat or cold intolerance. No recent change in weight. HEMATOLOGICAL: No anemia or easy  bruising or bleeding. NEUROLOGIC: No headache, seizures, numbness, tingling or weakness. PSYCHIATRIC: No depression, no loss of interest in normal activity or change in sleep pattern.     Exam:   BP 104/70   Pulse 74   Resp 16   Ht 4' 10.75" (1.492 m)   Wt 147 lb (66.7 kg)   LMP 09/25/2015   BMI 29.94 kg/m   Body mass index is 29.94 kg/m.  General appearance : Well developed well nourished female. No acute distress HEENT: Eyes: no retinal hemorrhage or exudates,  Neck supple, trachea midline, no carotid bruits, no thyroidmegaly Lungs: Clear to auscultation, no rhonchi or wheezes, or rib retractions  Heart: Regular rate and rhythm, no murmurs or gallops Breast:Examined in sitting and supine position were symmetrical in appearance, no palpable masses or tenderness,  no skin retraction, no nipple inversion, no nipple discharge, no skin discoloration, no axillary or supraclavicular lymphadenopathy Abdomen: no palpable masses or tenderness, no rebound or guarding Extremities: no edema or skin discoloration or tenderness  Pelvic: Vulva: Normal             Vagina: No gross lesions or discharge.  Pap reflex done at Vaginal vault.  Cervix/Uterus absent  Adnexa  Without masses or tenderness  Anus: Normal   Assessment/Plan:  50 y.o.  female for annual exam   1. Encounter for Papanicolaou smear of vagina as part of routine gynecological examination AdenoCa of Cervix FIGO 1A2.  S/P Robotic type III Radical laparoscopic Hysterectomy with BSO and Bilateral Pelvic Lymphadenectomy on 10/17/2015. CT scan of Abdomen and Pelvis Neg 09/2018.  Discharged from Dr Denman George at 5 yrs.  All Paps Neg since surgery.  Pap reflex done today.  No pain with IC with coconut oil.  Urine/BMs wnl.  Breasts normal. Overdue for Mammo, schedule now.  BMI 29.94.  Walking regularly. Fasting health labs here today.  Will schedule first screening Colonoscopy now. - CBC - Comp Met (CMET) - TSH - Lipid Profile - Vitamin D 1,25  dihydroxy - Cytology - PAP( Nectar)  2. Adenocarcinoma of cervix ([place]) AdenoCa of Cervix FIGO 1A2.  S/P Robotic type III Radical laparoscopic Hysterectomy with BSO and Bilateral Pelvic Lymphadenectomy on 10/17/2015. CT scan of Abdomen and Pelvis Neg 09/2018.  Discharged from Dr Denman George at 5 yrs.  All Paps Neg since surgery.  Pap reflex at the Vaginal Vault done today. - Cytology - PAP( )  3. Surgical menopause S/P Radical Hysterectomy with BSO 10/2015.  Well on no HRT.  No pain with IC with coconut oil.    4. Needs flu shot Flu shot given.  Other orders - amoxicillin-clavulanate (AUGMENTIN) 875-125 MG tablet; Take 1 tablet by mouth 2 (two) times daily.   Princess Bruins MD, 9:38 AM 08/03/2021

## 2021-08-07 LAB — CYTOLOGY - PAP: Diagnosis: NEGATIVE

## 2021-08-08 LAB — VITAMIN D 1,25 DIHYDROXY
Vitamin D 1, 25 (OH)2 Total: 40 pg/mL (ref 18–72)
Vitamin D2 1, 25 (OH)2: <8 pg/mL
Vitamin D3 1, 25 (OH)2: 40 pg/mL

## 2021-08-08 LAB — COMPREHENSIVE METABOLIC PANEL
AG Ratio: 1.4 (calc) (ref 1.0–2.5)
ALT: 12 U/L (ref 6–29)
AST: 15 U/L (ref 10–35)
Albumin: 4.2 g/dL (ref 3.6–5.1)
Alkaline phosphatase (APISO): 112 U/L (ref 37–153)
BUN: 15 mg/dL (ref 7–25)
CO2: 26 mmol/L (ref 20–32)
Calcium: 9 mg/dL (ref 8.6–10.4)
Chloride: 106 mmol/L (ref 98–110)
Creat: 0.68 mg/dL (ref 0.50–1.03)
Globulin: 3.1 g/dL (calc) (ref 1.9–3.7)
Glucose, Bld: 88 mg/dL (ref 65–99)
Potassium: 4.2 mmol/L (ref 3.5–5.3)
Sodium: 142 mmol/L (ref 135–146)
Total Bilirubin: 0.6 mg/dL (ref 0.2–1.2)
Total Protein: 7.3 g/dL (ref 6.1–8.1)

## 2021-08-08 LAB — CBC
HCT: 39.9 % (ref 35.0–45.0)
Hemoglobin: 13.1 g/dL (ref 11.7–15.5)
MCH: 28.7 pg (ref 27.0–33.0)
MCHC: 32.8 g/dL (ref 32.0–36.0)
MCV: 87.5 fL (ref 80.0–100.0)
MPV: 11.1 fL (ref 7.5–12.5)
Platelets: 208 10*3/uL (ref 140–400)
RBC: 4.56 10*6/uL (ref 3.80–5.10)
RDW: 12.6 % (ref 11.0–15.0)
WBC: 5.7 10*3/uL (ref 3.8–10.8)

## 2021-08-08 LAB — TSH: TSH: 1.26 mIU/L

## 2021-08-08 LAB — LIPID PANEL
Cholesterol: 169 mg/dL (ref <200)
HDL: 55 mg/dL (ref >=50)
LDL Cholesterol (Calc): 95 mg/dL (calc)
Non-HDL Cholesterol (Calc): 114 mg/dL (calc) (ref <130)
Total CHOL/HDL Ratio: 3.1 (calc) (ref <5.0)
Triglycerides: 93 mg/dL (ref <150)

## 2021-08-16 ENCOUNTER — Ambulatory Visit (INDEPENDENT_AMBULATORY_CARE_PROVIDER_SITE_OTHER): Payer: 59 | Admitting: Orthopaedic Surgery

## 2021-08-16 ENCOUNTER — Other Ambulatory Visit: Payer: Self-pay

## 2021-08-16 ENCOUNTER — Encounter: Payer: Self-pay | Admitting: Orthopaedic Surgery

## 2021-08-16 DIAGNOSIS — M79641 Pain in right hand: Secondary | ICD-10-CM | POA: Diagnosis not present

## 2021-08-16 NOTE — Progress Notes (Signed)
Office Visit Note   Patient: Krista Cross           Date of Birth: 08-10-1971           MRN: [id number] Visit Date: 08/16/2021              Requested by: Martinique, Betty G, MD [address] [place],  [place] [address] PCP: Martinique, Betty G, MD   Assessment & Plan: Visit Diagnoses:  1. Pain in right hand     Plan: Patient returns today for recheck of right hand injury.  She has been doing well.  No complaints other than some swelling and some discomfort with use of the right hand.  The wound has healed significantly and shows good granulation tissue.  There is no evidence of infection.  Mild residual surrounding swelling that is expected with this injury.    The wound is healing nicely.  She will continue with the topical ointment twice a day and cover with a Band-Aid until it is healed.  Avoid submerging the hand in any liquids until this is healed.  We will see her back as needed.  Follow-Up Instructions: No follow-ups on file.   Orders:  No orders of the defined types were placed in this encounter.  No orders of the defined types were placed in this encounter.     Procedures: No procedures performed   Clinical Data: No additional findings.   Subjective: Chief Complaint  Patient presents with   Right Hand - Pain    HPI  Review of Systems   Objective: Vital Signs: LMP 09/25/2015   Physical Exam  Ortho Exam  Specialty Comments:  No specialty comments available.  Imaging: No results found.   PMFS History: Patient Active Problem List   Diagnosis Date Noted   Cervical cancer, FIGO stage IB1 ([place]) 02/09/2018   Hypertrophy of tonsil-Right 06/06/2017   Surgical menopause 05/22/2016   Cervical cancer ([place]) 10/17/2015   Cervical cancer, FIGO stage IA2 ([place]) 08/18/2015   AGUS favor benign 07/24/2015   Vitamin D deficiency 07/06/2015   Atypical glandular cells of undetermined significance (AGUS) on cervical Pap smear 07/06/2015   Cervical  cancer screening 02/27/2015   Varicose veins of bilateral lower extremities with other complications 75/44/9201   Routine general medical examination at a health care facility 12/07/2013   TINEA CORPORIS 10/27/2009   SPRAIN&STRAIN UNSPEC SITE SHOULDER&UPPER ARM 10/27/2009   MICROSCOPIC HEMATURIA 09/19/2009   Past Medical History:  Diagnosis Date   Cervical adenocarcinoma ([place])    History of abnormal cervical Pap smear    w/ cryoablation in 2000   Varicose veins     Family History  Problem Relation Age of Onset   Hyperlipidemia Mother    Diabetes Mother    Varicose Veins Sister    Lupus Sister    Cancer Neg Hx     Past Surgical History:  Procedure Laterality Date   CERVICAL CONIZATION W/BX N/A 08/31/2015   Procedure: CONIZATION CERVIX WITH BIOPSY;  Surgeon: Everitt Amber, MD;  Location: [facility];  Service: Gynecology;  Laterality: N/A;   CESAREAN SECTION  07-26-2003  &  12-28-2007   Bilateral Tubal Ligation with last one   ENDOVENOUS ABLATION SAPHENOUS VEIN W/ LASER Right 05-05-2014   EVLA RIGHT GREATER SAPHENOUS VEIN  BY TODD EARLY MD   ROBOTIC ASSISTED TOTAL HYSTERECTOMY WITH BILATERAL SALPINGO OOPHERECTOMY Bilateral 10/17/2015   Procedure: XI ROBOTIC ASSISTED TYPE III RADICAL TOTAL HYSTERECTOMY WITH BILATERAL SALPINGECTOMY OOPHORECTOMY WITH SENTINEL  LYMPH NODE BIOPSY;  Surgeon: Everitt Amber, MD;  Location[facility];  Service: Gynecology;  Laterality: Bilateral;   Social History   Occupational History   Not on file  Tobacco Use   Smoking status: Never   Smokeless tobacco: Never  Vaping Use   Vaping Use: Never used  Substance and Sexual Activity   Alcohol use: No   Drug use: No   Sexual activity: Yes    Partners: Male    Birth control/protection: Surgical    Comment: 1st intercourse- 68, partner- 77, married- 104 yrs, hysterectomy

## 2022-05-13 ENCOUNTER — Encounter: Payer: Self-pay | Admitting: Internal Medicine

## 2022-05-13 ENCOUNTER — Ambulatory Visit (INDEPENDENT_AMBULATORY_CARE_PROVIDER_SITE_OTHER): Payer: Commercial Managed Care - HMO | Admitting: Internal Medicine

## 2022-05-13 VITALS — BP 112/78 | HR 62 | Temp 98.4°F | Wt 145.8 lb

## 2022-05-13 DIAGNOSIS — R300 Dysuria: Secondary | ICD-10-CM

## 2022-05-13 DIAGNOSIS — M545 Low back pain, unspecified: Secondary | ICD-10-CM | POA: Diagnosis not present

## 2022-05-13 DIAGNOSIS — Z23 Encounter for immunization: Secondary | ICD-10-CM | POA: Diagnosis not present

## 2022-05-13 DIAGNOSIS — N3001 Acute cystitis with hematuria: Secondary | ICD-10-CM

## 2022-05-13 DIAGNOSIS — G8929 Other chronic pain: Secondary | ICD-10-CM | POA: Diagnosis not present

## 2022-05-13 LAB — POCT URINALYSIS DIPSTICK
Bilirubin, UA: NEGATIVE
Glucose, UA: NEGATIVE
Ketones, UA: POSITIVE
Nitrite, UA: NEGATIVE
Protein, UA: POSITIVE — AB
Spec Grav, UA: 1.025 (ref 1.010–1.025)
Urobilinogen, UA: 0.2 E.U./dL
pH, UA: 5.5 (ref 5.0–8.0)

## 2022-05-13 MED ORDER — SULFAMETHOXAZOLE-TRIMETHOPRIM 800-160 MG PO TABS: 1.0000 | ORAL_TABLET | Freq: Two times a day (BID) | ORAL | 0 refills | Status: AC

## 2022-05-13 NOTE — Addendum Note (Signed)
Addended by: Westley Hummer B on: 05/13/2022 03:19 PM   Modules accepted: Orders

## 2022-05-13 NOTE — Progress Notes (Signed)
Established Patient Office Visit     CC/Reason for Visit: Right lower back pain  HPI: Summar Mcglothlin is a 51 y.o. female who is coming in today for the above mentioned reasons.  She has been having low back pain for about a year if not longer.  She has no radiculopathy.  She does state that for the past few days she has been having some dysuria, urine has been darker in color.  She has no leg or foot numbness.   Past Medical/Surgical History: Past Medical History:  Diagnosis Date   Cervical adenocarcinoma ([place])    History of abnormal cervical Pap smear    w/ cryoablation in 2000   Varicose veins     Past Surgical History:  Procedure Laterality Date   CERVICAL CONIZATION W/BX N/A 08/31/2015   Procedure: CONIZATION CERVIX WITH BIOPSY;  Surgeon: Everitt Amber, MD;  Location: [facility];  Service: Gynecology;  Laterality: N/A;   CESAREAN SECTION  07-26-2003  &  12-28-2007   Bilateral Tubal Ligation with last one   ENDOVENOUS ABLATION SAPHENOUS VEIN W/ LASER Right 05-05-2014   EVLA RIGHT GREATER SAPHENOUS VEIN  BY TODD EARLY MD   ROBOTIC ASSISTED TOTAL HYSTERECTOMY WITH BILATERAL SALPINGO OOPHERECTOMY Bilateral 10/17/2015   Procedure: XI ROBOTIC ASSISTED TYPE III RADICAL TOTAL HYSTERECTOMY WITH BILATERAL SALPINGECTOMY OOPHORECTOMY WITH SENTINEL LYMPH NODE BIOPSY;  Surgeon: Everitt Amber, MD;  Location: [facility];  Service: Gynecology;  Laterality: Bilateral;    Social History:  reports that she has never smoked. She has never used smokeless tobacco. She reports that she does not drink alcohol and does not use drugs.  Allergies: Allergies  Allergen Reactions   Ibuprofen Swelling    Family History:  Family History  Problem Relation Age of Onset   Hyperlipidemia Mother    Diabetes Mother    Varicose Veins Sister    Lupus Sister    Cancer Neg Hx      Current Outpatient Medications:    Multiple Vitamins-Minerals (MULTIVITAMIN WITH MINERALS) tablet, Take 1  tablet by mouth daily., Disp: , Rfl:    mupirocin ointment (BACTROBAN) 2 %, Apply 1 application topically 2 (two) times daily., Disp: 22 g, Rfl: 2   sulfamethoxazole-trimethoprim (BACTRIM DS) 800-160 MG tablet, Take 1 tablet by mouth 2 (two) times daily for 7 days., Disp: 14 tablet, Rfl: 0  Review of Systems:  Constitutional: Denies fever, chills, diaphoresis, appetite change and fatigue.  HEENT: Denies photophobia, eye pain, redness, hearing loss, ear pain, congestion, sore throat, rhinorrhea, sneezing, mouth sores, trouble swallowing, neck pain, neck stiffness and tinnitus.   Respiratory: Denies SOB, DOE, cough, chest tightness,  and wheezing.   Cardiovascular: Denies chest pain, palpitations and leg swelling.  Gastrointestinal: Denies nausea, vomiting, abdominal pain, diarrhea, constipation, blood in stool and abdominal distention.  Endocrine: Denies: hot or cold intolerance, sweats, changes in hair or nails, polyuria, polydipsia. Musculoskeletal: Denies myalgias, joint swelling, arthralgias and gait problem.  Skin: Denies pallor, rash and wound.  Neurological: Denies dizziness, seizures, syncope, weakness, light-headedness, numbness and headaches.  Hematological: Denies adenopathy. Easy bruising, personal or family bleeding history  Psychiatric/Behavioral: Denies suicidal ideation, mood changes, confusion, nervousness, sleep disturbance and agitation    Physical Exam: Vitals:   05/13/22 1357  BP: 112/78  Pulse: 62  Temp: 98.4 F (36.9 C)  TempSrc: Oral  SpO2: 96%  Weight: 145 lb 12.8 oz (66.1 kg)    Body mass index is 29.7 kg/m.   Constitutional: NAD, calm, comfortable  Eyes: PERRL, lids and conjunctivae normal ENMT: Mucous membranes are moist.   Psychiatric: Normal judgment and insight. Alert and oriented x 3. Normal mood.    Impression and Plan:  Chronic right-sided low back pain without sciatica  Dysuria - Plan: POCT urinalysis dipstick  Need for influenza  vaccination  Need for shingles vaccine  Acute cystitis with hematuria - Plan: sulfamethoxazole-trimethoprim (BACTRIM DS) 800-160 MG tablet  -Flu vaccine and first shingles vaccine administered today. -Urine dipstick done in office shows leukocytes, blood.  Likely has a UTI.  Will treat with Bactrim for a week. -For her back pain have advised she try as needed NSAIDs, back stretches, icing, local massage therapy.  Part of her back pain might be related to her UTI, however chronicity makes me believe that this is likely MSK related.  Time spent:22 minutes reviewing chart, interviewing and examining patient and formulating plan of care.    Lelon Frohlich, MD [facility]

## 2022-05-14 LAB — URINALYSIS, ROUTINE W REFLEX MICROSCOPIC
Bilirubin Urine: NEGATIVE
Nitrite: POSITIVE — AB
Specific Gravity, Urine: >=1.03 — AB (ref 1.000–1.030)
Total Protein, Urine: NEGATIVE
Urine Glucose: NEGATIVE
Urobilinogen, UA: 0.2 (ref 0.0–1.0)
pH: 5.5 (ref 5.0–8.0)

## 2022-05-15 LAB — URINE CULTURE: SPECIMEN QUALITY:: ADEQUATE

## 2022-05-16 LAB — URINE CULTURE: MICRO NUMBER:: 13899634

## 2022-05-20 MED ORDER — AMOXICILLIN-POT CLAVULANATE 875-125 MG PO TABS: 1.0000 | ORAL_TABLET | Freq: Two times a day (BID) | ORAL | 0 refills | Status: DC

## 2022-05-20 NOTE — Addendum Note (Signed)
Addended by: Agnes Lawrence on: 05/20/2022 04:07 PM   Modules accepted: Orders

## 2022-05-23 ENCOUNTER — Telehealth: Payer: Self-pay | Admitting: Family Medicine

## 2022-05-23 NOTE — Telephone Encounter (Signed)
Pt was seen on 05/13/22 and called MD for the results of her urine test.   Please call [phone]

## 2022-05-23 NOTE — Telephone Encounter (Signed)
I used pacific interpreters to call patient. We went over her results and she verbalized understanding.

## 2022-08-08 ENCOUNTER — Ambulatory Visit (INDEPENDENT_AMBULATORY_CARE_PROVIDER_SITE_OTHER): Payer: Commercial Managed Care - HMO | Admitting: Obstetrics & Gynecology

## 2022-08-08 ENCOUNTER — Encounter: Payer: Self-pay | Admitting: Obstetrics & Gynecology

## 2022-08-08 ENCOUNTER — Other Ambulatory Visit (HOSPITAL_COMMUNITY)
Admission: RE | Admit: 2022-08-08 | Discharge: 2022-08-08 | Disposition: A | Discharge status: Home / Self Care | Payer: Commercial Managed Care - HMO | Source: Ambulatory Visit | Attending: Obstetrics & Gynecology | Admitting: Obstetrics & Gynecology

## 2022-08-08 VITALS — BP 110/72 | HR 81 | Ht 58.75 in | Wt 143.0 lb

## 2022-08-08 DIAGNOSIS — Z1272 Encounter for screening for malignant neoplasm of vagina: Secondary | ICD-10-CM | POA: Insufficient documentation

## 2022-08-08 DIAGNOSIS — Z01419 Encounter for gynecological examination (general) (routine) without abnormal findings: Secondary | ICD-10-CM | POA: Insufficient documentation

## 2022-08-08 DIAGNOSIS — E8940 Asymptomatic postprocedural ovarian failure: Secondary | ICD-10-CM

## 2022-08-08 DIAGNOSIS — C539 Malignant neoplasm of cervix uteri, unspecified: Secondary | ICD-10-CM | POA: Diagnosis not present

## 2022-08-08 NOTE — Progress Notes (Signed)
Krista Cross 14-Apr-1971 [id number]   History:    51 y.o. [id number] Married.  Has 3 grand-children.   RP:  Established patient presenting for annual gyn exam    HPI: AdenoCa of Cervix FIGO 1A2.  S/P Robotic type III Radical laparoscopic Hysterectomy with BSO and Bilateral Pelvic Lymphadenectomy on 10/17/2015. CT scan of Abdomen and Pelvis Neg 09/2018.  Discharged from Dr Denman George at 5 yrs.  All Paps Neg since surgery.  Last Pap Neg 08/2021.  Pap reflex today.  No pain with IC with coconut oil.  Urine/BMs wnl.  Breasts normal. Mammo at [facility] in 2023, will obtain fax.  BMI 29.13.  Walking regularly. Fasting health labs here today.  Will schedule first screening Colonoscopy now.  Past medical history,surgical history, family history and social history were all reviewed and documented in the EPIC chart.  Gynecologic History Patient's last menstrual period was 09/25/2015.  Obstetric History OB History  Gravida Para Term Preterm AB Living  5 4 0   1 4  SAB IAB Ectopic Multiple Live Births  1            # Outcome Date GA Lbr Len/2nd Weight Sex Delivery Anes PTL Lv  5 SAB           4 Para      CS-LTranv     3 Para      CS-LTranv     2 Para      Vag-Spont     1 Para      Vag-Spont        ROS: A ROS was performed and pertinent positives and negatives are included in the history. GENERAL: No fevers or chills. HEENT: No change in vision, no earache, sore throat or sinus congestion. NECK: No pain or stiffness. CARDIOVASCULAR: No chest pain or pressure. No palpitations. PULMONARY: No shortness of breath, cough or wheeze. GASTROINTESTINAL: No abdominal pain, nausea, vomiting or diarrhea, melena or bright red blood per rectum. GENITOURINARY: No urinary frequency, urgency, hesitancy or dysuria. MUSCULOSKELETAL: No joint or muscle pain, no back pain, no recent trauma. DERMATOLOGIC: No rash, no itching, no lesions. ENDOCRINE: No polyuria, polydipsia, no heat or cold intolerance. No recent change in  weight. HEMATOLOGICAL: No anemia or easy bruising or bleeding. NEUROLOGIC: No headache, seizures, numbness, tingling or weakness. PSYCHIATRIC: No depression, no loss of interest in normal activity or change in sleep pattern.     Exam:   BP 110/72   Pulse 81   Ht 4' 10.75" (1.492 m)   Wt 143 lb (64.9 kg)   LMP 09/25/2015   SpO2 98%   BMI 29.13 kg/m   Body mass index is 29.13 kg/m.  General appearance : Well developed well nourished female. No acute distress HEENT: Eyes: no retinal hemorrhage or exudates,  Neck supple, trachea midline, no carotid bruits, no thyroidmegaly Lungs: Clear to auscultation, no rhonchi or wheezes, or rib retractions  Heart: Regular rate and rhythm, no murmurs or gallops Breast:Examined in sitting and supine position were symmetrical in appearance, no palpable masses or tenderness,  no skin retraction, no nipple inversion, no nipple discharge, no skin discoloration, no axillary or supraclavicular lymphadenopathy Abdomen: no palpable masses or tenderness, no rebound or guarding Extremities: no edema or skin discoloration or tenderness  Pelvic: Vulva: Normal             Vagina: No gross lesions or discharge.  Pap reflex done.  Cervix/Uterus absent  Adnexa  Without masses or tenderness  Anus: Normal  Assessment/Plan:  51 y.o. female for annual exam   1. Encounter for Papanicolaou smear of vagina as part of routine gynecological examination AdenoCa of Cervix FIGO 1A2.  S/P Robotic type III Radical laparoscopic Hysterectomy with BSO and Bilateral Pelvic Lymphadenectomy on 10/17/2015. CT scan of Abdomen and Pelvis Neg 09/2018.  Discharged from Dr Rossi at 5 yrs.  All Paps Neg since surgery.  Last Pap Neg 08/2021.  Pap reflex today.  No pain with IC with coconut oil.  Urine/BMs wnl.  Breasts normal. Mammo at [facility] in 2023, will obtain fax.  BMI 29.13.  Walking regularly. Fasting health labs here today.  Will schedule first screening Colonoscopy now. - CBC - Comp  Met (CMET) - Lipid Profile - TSH - Vitamin D (25 hydroxy) - Cytology - PAP( [facility])  2. Adenocarcinoma of cervix (HCC) denoCa of Cervix FIGO 1A2.  S/P Robotic type III Radical laparoscopic Hysterectomy with BSO and Bilateral Pelvic Lymphadenectomy on 10/17/2015. CT scan of Abdomen and Pelvis Neg 09/2018.  Discharged from Dr Rossi at 5 yrs.  All Paps Neg since surgery.  Last Pap Neg 08/2021.  Pap reflex today.  No pain with IC with coconut oil.    3. Surgical menopause  Well on no HRT.  Marie-Lyne Lavoie MD, 8:18 AM    

## 2022-08-09 LAB — LIPID PANEL
Cholesterol: 183 mg/dL (ref <200)
HDL: 60 mg/dL (ref >=50)
LDL Cholesterol (Calc): 104 mg/dL (calc) — ABNORMAL HIGH
Non-HDL Cholesterol (Calc): 123 mg/dL (calc) (ref <130)
Total CHOL/HDL Ratio: 3.1 (calc) (ref <5.0)
Triglycerides: 93 mg/dL (ref <150)

## 2022-08-09 LAB — COMPREHENSIVE METABOLIC PANEL
AG Ratio: 1.3 (calc) (ref 1.0–2.5)
ALT: 16 U/L (ref 6–29)
AST: 18 U/L (ref 10–35)
Albumin: 4.4 g/dL (ref 3.6–5.1)
Alkaline phosphatase (APISO): 106 U/L (ref 37–153)
BUN: 14 mg/dL (ref 7–25)
CO2: 29 mmol/L (ref 20–32)
Calcium: 9.2 mg/dL (ref 8.6–10.4)
Chloride: 105 mmol/L (ref 98–110)
Creat: 0.69 mg/dL (ref 0.50–1.03)
Globulin: 3.3 g/dL (calc) (ref 1.9–3.7)
Glucose, Bld: 93 mg/dL (ref 65–99)
Potassium: 4.2 mmol/L (ref 3.5–5.3)
Sodium: 142 mmol/L (ref 135–146)
Total Bilirubin: 0.4 mg/dL (ref 0.2–1.2)
Total Protein: 7.7 g/dL (ref 6.1–8.1)

## 2022-08-09 LAB — CBC
HCT: 40.4 % (ref 35.0–45.0)
Hemoglobin: 13.5 g/dL (ref 11.7–15.5)
MCH: 28.6 pg (ref 27.0–33.0)
MCHC: 33.4 g/dL (ref 32.0–36.0)
MCV: 85.6 fL (ref 80.0–100.0)
MPV: 11 fL (ref 7.5–12.5)
Platelets: 201 10*3/uL (ref 140–400)
RBC: 4.72 10*6/uL (ref 3.80–5.10)
RDW: 12 % (ref 11.0–15.0)
WBC: 5.2 10*3/uL (ref 3.8–10.8)

## 2022-08-09 LAB — TSH: TSH: 1.5 mIU/L

## 2022-08-09 LAB — VITAMIN D 25 HYDROXY (VIT D DEFICIENCY, FRACTURES): Vit D, 25-Hydroxy: 32 ng/mL (ref 30–100)

## 2022-08-14 LAB — CYTOLOGY - PAP
Comment: NEGATIVE
Diagnosis: NEGATIVE
High risk HPV: NEGATIVE

## 2022-09-27 NOTE — Progress Notes (Deleted)
   ACUTE VISIT No chief complaint on file.  HPI: [name] is a 52 y.o. female, who is here today complaining of *** HPI  Review of Systems See other pertinent positives and negatives in HPI.  Current Outpatient Medications on File Prior to Visit  Medication Sig Dispense Refill   Multiple Vitamins-Minerals (MULTIVITAMIN WITH MINERALS) tablet Take 1 tablet by mouth daily.     No current facility-administered medications on file prior to visit.    Past Medical History:  Diagnosis Date   Cervical adenocarcinoma ([place])    History of abnormal cervical Pap smear    w/ cryoablation in 2000   Varicose veins    Allergies  Allergen Reactions   Ibuprofen Swelling    Social History   Socioeconomic History   Marital status: Married    Spouse name: Not on file   Number of children: Not on file   Years of education: Not on file   Highest education level: Not on file  Occupational History   Not on file  Tobacco Use   Smoking status: Never   Smokeless tobacco: Never  Vaping Use   Vaping Use: Never used  Substance and Sexual Activity   Alcohol use: No   Drug use: No   Sexual activity: Yes    Partners: Male    Birth control/protection: Surgical    Comment: 1st intercourse- 40, partner- 1, hysterectomy  Other Topics Concern   Not on file  Social History Narrative   Not on file   Social Determinants of Health   Financial Resource Strain: Not on file  Food Insecurity: Not on file  Transportation Needs: Not on file  Physical Activity: Not on file  Stress: Not on file  Social Connections: Not on file    There were no vitals filed for this visit. There is no height or weight on file to calculate BMI.  Physical Exam  ASSESSMENT AND PLAN: There are no diagnoses linked to this encounter.  No follow-ups on file.  Betty G. Martinique, MD  [facility]. [place] office.  Discharge Instructions   None

## 2022-09-30 ENCOUNTER — Ambulatory Visit: Payer: Commercial Managed Care - HMO | Admitting: Family Medicine

## 2022-10-11 NOTE — Progress Notes (Unsigned)
   ACUTE VISIT No chief complaint on file.  HPI: [name] is a 52 y.o. female, who is here today complaining of *** HPI  Review of Systems See other pertinent positives and negatives in HPI.  Current Outpatient Medications on File Prior to Visit  Medication Sig Dispense Refill   Multiple Vitamins-Minerals (MULTIVITAMIN WITH MINERALS) tablet Take 1 tablet by mouth daily.     No current facility-administered medications on file prior to visit.    Past Medical History:  Diagnosis Date   Cervical adenocarcinoma ([place])    History of abnormal cervical Pap smear    w/ cryoablation in 2000   Varicose veins    Allergies  Allergen Reactions   Ibuprofen Swelling    Social History   Socioeconomic History   Marital status: Married    Spouse name: Not on file   Number of children: Not on file   Years of education: Not on file   Highest education level: Not on file  Occupational History   Not on file  Tobacco Use   Smoking status: Never   Smokeless tobacco: Never  Vaping Use   Vaping Use: Never used  Substance and Sexual Activity   Alcohol use: No   Drug use: No   Sexual activity: Yes    Partners: Male    Birth control/protection: Surgical    Comment: 1st intercourse- 24, partner- 1, hysterectomy  Other Topics Concern   Not on file  Social History Narrative   Not on file   Social Determinants of Health   Financial Resource Strain: Not on file  Food Insecurity: Not on file  Transportation Needs: Not on file  Physical Activity: Not on file  Stress: Not on file  Social Connections: Not on file    There were no vitals filed for this visit. There is no height or weight on file to calculate BMI.  Physical Exam  ASSESSMENT AND PLAN: There are no diagnoses linked to this encounter.  No follow-ups on file.  Reco Shonk G. Martinique, MD  [facility]. [place] office.  Discharge Instructions   None

## 2022-10-14 ENCOUNTER — Encounter: Payer: Self-pay | Admitting: Family Medicine

## 2022-10-14 ENCOUNTER — Ambulatory Visit (INDEPENDENT_AMBULATORY_CARE_PROVIDER_SITE_OTHER): Payer: Commercial Managed Care - HMO | Admitting: Family Medicine

## 2022-10-14 VITALS — BP 118/70 | HR 88 | Resp 12 | Ht 58.75 in | Wt 145.2 lb

## 2022-10-14 DIAGNOSIS — Z1211 Encounter for screening for malignant neoplasm of colon: Secondary | ICD-10-CM | POA: Diagnosis not present

## 2022-10-14 DIAGNOSIS — M545 Low back pain, unspecified: Secondary | ICD-10-CM

## 2022-10-14 DIAGNOSIS — C539 Malignant neoplasm of cervix uteri, unspecified: Secondary | ICD-10-CM

## 2022-10-14 DIAGNOSIS — G8929 Other chronic pain: Secondary | ICD-10-CM

## 2022-10-14 DIAGNOSIS — M7542 Impingement syndrome of left shoulder: Secondary | ICD-10-CM

## 2022-10-14 DIAGNOSIS — M25532 Pain in left wrist: Secondary | ICD-10-CM | POA: Diagnosis not present

## 2022-10-14 MED ORDER — MELOXICAM 7.5 MG PO TABS: 7.5000 mg | ORAL_TABLET | Freq: Every day | ORAL | 1 refills | Status: DC | PRN

## 2022-10-14 NOTE — Assessment & Plan Note (Signed)
Pain has been persistent for at least 1 year. History does not suggest a serious process but given her PMHx of cervical adenocarcinoma, I think imaging is warrant. Meloxicam started today.

## 2022-10-14 NOTE — Patient Instructions (Addendum)
A few things to remember from today's visit:  Left hip impingement syndrome - Plan: Ambulatory referral to Physical Therapy, meloxicam (MOBIC) 7.5 MG tablet  Adenocarcinoma of cervix ([place]), Chronic  Left wrist pain - Plan: DG Wrist Complete Left, meloxicam (MOBIC) 7.5 MG tablet  Colon cancer screening - Plan: Ambulatory referral to Gastroenterology  Meloxicam 1-2 tab diario cuando lo necesite para dolor.  If you need refills for medications you take chronically, please call your pharmacy. Do not use My Chart to request refills or for acute issues that need immediate attention. If you send a my chart message, it may take a few days to be addressed, specially if I am not in the office.  Please be sure medication list is accurate. If a new problem present, please set up appointment sooner than planned today.

## 2022-10-30 ENCOUNTER — Other Ambulatory Visit: Payer: Self-pay

## 2022-10-30 ENCOUNTER — Ambulatory Visit (INDEPENDENT_AMBULATORY_CARE_PROVIDER_SITE_OTHER)
Admission: RE | Admit: 2022-10-30 | Discharge: 2022-10-30 | Disposition: A | Discharge status: Home / Self Care | Payer: Commercial Managed Care - HMO | Source: Ambulatory Visit | Attending: Family Medicine | Admitting: Family Medicine

## 2022-10-30 ENCOUNTER — Ambulatory Visit (AMBULATORY_SURGERY_CENTER): Payer: Commercial Managed Care - HMO

## 2022-10-30 ENCOUNTER — Ambulatory Visit: Payer: Commercial Managed Care - HMO | Attending: Family Medicine | Admitting: Physical Therapy

## 2022-10-30 ENCOUNTER — Encounter: Payer: Self-pay | Admitting: Physical Therapy

## 2022-10-30 VITALS — Ht <= 58 in | Wt 147.0 lb

## 2022-10-30 DIAGNOSIS — M545 Low back pain, unspecified: Secondary | ICD-10-CM

## 2022-10-30 DIAGNOSIS — M25632 Stiffness of left wrist, not elsewhere classified: Secondary | ICD-10-CM | POA: Diagnosis present

## 2022-10-30 DIAGNOSIS — L239 Allergic contact dermatitis, unspecified cause: Secondary | ICD-10-CM | POA: Insufficient documentation

## 2022-10-30 DIAGNOSIS — Z1211 Encounter for screening for malignant neoplasm of colon: Secondary | ICD-10-CM

## 2022-10-30 DIAGNOSIS — M7542 Impingement syndrome of left shoulder: Secondary | ICD-10-CM | POA: Insufficient documentation

## 2022-10-30 DIAGNOSIS — M25522 Pain in left elbow: Secondary | ICD-10-CM | POA: Insufficient documentation

## 2022-10-30 DIAGNOSIS — G8929 Other chronic pain: Secondary | ICD-10-CM | POA: Diagnosis not present

## 2022-10-30 DIAGNOSIS — M25532 Pain in left wrist: Secondary | ICD-10-CM | POA: Insufficient documentation

## 2022-10-30 DIAGNOSIS — M6281 Muscle weakness (generalized): Secondary | ICD-10-CM | POA: Diagnosis present

## 2022-10-30 DIAGNOSIS — J069 Acute upper respiratory infection, unspecified: Secondary | ICD-10-CM | POA: Insufficient documentation

## 2022-10-30 DIAGNOSIS — N390 Urinary tract infection, site not specified: Secondary | ICD-10-CM | POA: Insufficient documentation

## 2022-10-30 LAB — HM MAMMOGRAPHY

## 2022-10-30 MED ORDER — NA SULFATE-K SULFATE-MG SULF 17.5-3.13-1.6 GM/177ML PO SOLN: 1.0000 | Freq: Once | ORAL | 0 refills | Status: DC

## 2022-10-30 MED ORDER — NA SULFATE-K SULFATE-MG SULF 17.5-3.13-1.6 GM/177ML PO SOLN: 1.0000 | Freq: Once | ORAL | 0 refills | Status: AC

## 2022-10-30 NOTE — Therapy (Signed)
OUTPATIENT PHYSICAL THERAPY SHOULDER EVALUATION   Patient Name: Krista Cross MRN: [id number] DOB:1971/02/20, 52 y.o., female Today's Date: 10/30/2022  END OF SESSION:  PT End of Session - 10/30/22 0801     Visit Number 1    Number of Visits 8    Date for PT Re-Evaluation 12/25/22    Authorization Type [employer]    PT Start Time 0801    PT Stop Time 0845    PT Time Calculation (min) 44 min    Activity Tolerance Patient tolerated treatment well    Behavior During Therapy [facility] for tasks assessed/performed             Past Medical History:  Diagnosis Date   Cervical adenocarcinoma ([place])    History of abnormal cervical Pap smear    w/ cryoablation in 2000   Varicose veins    Past Surgical History:  Procedure Laterality Date   CERVICAL CONIZATION W/BX N/A 08/31/2015   Procedure: CONIZATION CERVIX WITH BIOPSY;  Surgeon: Everitt Amber, MD;  Location: [place];  Service: Gynecology;  Laterality: N/A;   CESAREAN SECTION  07-26-2003  &  12-28-2007   Bilateral Tubal Ligation with last one   ENDOVENOUS ABLATION SAPHENOUS VEIN W/ LASER Right 05-05-2014   EVLA RIGHT GREATER SAPHENOUS VEIN  BY TODD EARLY MD   ROBOTIC ASSISTED TOTAL HYSTERECTOMY WITH BILATERAL SALPINGO OOPHERECTOMY Bilateral 10/17/2015   Procedure: XI ROBOTIC ASSISTED TYPE III RADICAL TOTAL HYSTERECTOMY WITH BILATERAL SALPINGECTOMY OOPHORECTOMY WITH SENTINEL LYMPH NODE BIOPSY;  Surgeon: Everitt Amber, MD;  Location: [facility];  Service: Gynecology;  Laterality: Bilateral;   Patient Active Problem List   Diagnosis Date Noted   Chronic right-sided low back pain without sciatica 10/14/2022   Cervical cancer, FIGO stage IB1 ([place]) 02/09/2018   Hypertrophy of tonsil-Right 06/06/2017   Surgical menopause 05/22/2016   Cervical cancer ([place]) 10/17/2015   Cervical cancer, FIGO stage IA2 ([place]) 08/18/2015   AGUS favor benign 07/24/2015   Vitamin D deficiency 07/06/2015   Atypical glandular cells of undetermined  significance (AGUS) on cervical Pap smear 07/06/2015   Cervical cancer screening 02/27/2015   Varicose veins of bilateral lower extremities with other complications [id number]   Routine general medical examination at a health care facility 12/07/2013   TINEA CORPORIS 10/27/2009   SPRAIN&STRAIN UNSPEC SITE SHOULDER&UPPER ARM 10/27/2009   MICROSCOPIC HEMATURIA 09/19/2009    PCP: Martinique, Betty G, MD  REFERRING PROVIDER: Martinique, Betty G, MD  REFERRING DIAG: L shoulder impingement  THERAPY DIAG:  Pain in left wrist  Pain in left elbow  Stiffness of left wrist, not elsewhere classified  Muscle weakness (generalized)  Rationale for Evaluation and Treatment: Rehabilitation  ONSET DATE: ~6 months  SUBJECTIVE:  SUBJECTIVE STATEMENT: Pt states her left arm has been hurting but thinks it mainly has to do with her arthritis but doctor wanted to send her to PT. She reports she mainly feels it around her elbow but if she grips hard she can feel it in her wrist. Pt does note that her shoulder does hurt when she brings her arm back and lifts it up. Wrist pain started hurting ~2 months ago. Pt points to elbow pain in extensor bundle. Pt is left handed.  PERTINENT HISTORY: Left hand dominant, arthritis  PAIN:  Are you having pain? Yes: NPRS scale: 4 in elbow 6 in wrist/10 Pain location: L medial wrist and lateral elbow Pain description: sharp Aggravating factors: Using left wrist Relieving factors: Has not tried any relieving factors  PRECAUTIONS: None  WEIGHT BEARING RESTRICTIONS: No  FALLS:  Has patient fallen in last 6 months? No  LIVING ENVIRONMENT: Lives with: lives with their family and lives with their spouse Lives in: House/apartment  OCCUPATION: Works in [employer] -- has to carry a lot  of things  PLOF: Independent  PATIENT GOALS:Improve pain  NEXT MD VISIT:   OBJECTIVE:   DIAGNOSTIC FINDINGS:  Ordered x-rays for wrist  PATIENT SURVEYS:  N/A -- pt is Spanish speaking  COGNITION: Overall cognitive status: Within functional limits for tasks assessed     SENSATION: WFL -- sometimes fingers will go numb in the mornings  POSTURE: Rounded shoulder  UPPER EXTREMITY ROM:   Active ROM Right eval Left eval  Shoulder flexion  >150  Shoulder extension    Shoulder abduction  >150  Shoulder adduction    Shoulder internal rotation    Shoulder external rotation    Elbow flexion    Elbow extension    Wrist flexion 60 40  Wrist extension 80 60*  Wrist ulnar deviation    Wrist radial deviation 30 30  Wrist pronation    Wrist supination    (Blank rows = not tested) * = pain  UPPER EXTREMITY MMT:  MMT Right eval Left eval  Shoulder flexion 5 4*  Shoulder extension 5 5  Shoulder abduction 5 4*  Shoulder adduction    Shoulder internal rotation 5 3+*  Shoulder external rotation 5 3+*  Middle trapezius    Lower trapezius    Elbow flexion 5 5  Elbow extension 5 5  Wrist flexion 5 5  Wrist extension 5 4+  Wrist ulnar deviation    Wrist radial deviation    Wrist pronation    Wrist supination    Grip strength (lbs) 40, 45, 35 30, 25, 32  (Blank rows = not tested) * = reproduced pain in elbow/wrist  SHOULDER SPECIAL TESTS: Impingement tests: Neer impingement test: positive  SLAP lesions: Biceps load test: negative Instability tests: did not assess Rotator cuff assessment: Empty can test: positive  Biceps assessment: Speed's test: negative  JOINT MOBILITY TESTING:  L wrist hypomobile  PALPATION:  TTP L forearm extensor bundle   TODAY'S TREATMENT:  DATE: 10/30/22 See HEP below   PATIENT EDUCATION: Education  details: Exam findings, POC, use of ice/heat, self massage Person educated: Patient Education method: Explanation, Demonstration, and Handouts Education comprehension: verbalized understanding, returned demonstration, and needs further education  HOME EXERCISE PROGRAM: Access Code: [email or web address] URL: [email or web address] Date: 10/30/2022 Prepared by: Estill Bamberg April Thurnell Garbe  Exercises - Seated Wrist Flexion Stretch  - 3 x daily - 7 x weekly - 2 sets - 30 sec hold - Seated Wrist Extension Stretch  - 3 x daily - 7 x weekly - 2 sets - 30 sec hold - Standing Shoulder External Rotation with Resistance  - 1 x daily - 7 x weekly - 2 sets - 10 reps - Shoulder Internal Rotation with Resistance  - 1 x daily - 7 x weekly - 3 sets - 10 reps - Seated Elbow Manual Massage Counterclockwise  - 1 x daily - 7 x weekly - 2 sets - 10 reps  ASSESSMENT:  CLINICAL IMPRESSION: Patient is a 53 y.o. F who was seen today for physical therapy evaluation and treatment for referral of L shoulder impingement; however, pt with primary complaint of L wrist and elbow pain. Assessment significant for L medial wrist pain (likely extensor carpi ulnaris) and lateral elbow/forearm extensor tenderness with resultant decreased wrist ROM/strength and weak grip strength. Found pt to have weak L shoulder musculature likely also contributing to L arm dysfunction and pain. S/S appear consistent to lateral epicondylitis contributing to wrist extensor tendonitis. Pt would benefit from PT to address these issues.   OBJECTIVE IMPAIRMENTS: decreased activity tolerance, decreased mobility, decreased ROM, decreased strength, increased fascial restrictions, increased muscle spasms, impaired UE functional use, improper body mechanics, and pain.   ACTIVITY LIMITATIONS: carrying, lifting, reach over head, and hygiene/grooming  PARTICIPATION LIMITATIONS: meal prep, cleaning, laundry, and occupation  PERSONAL FACTORS: Past/current  experiences and Profession are also affecting patient's functional outcome.   REHAB POTENTIAL: Good  CLINICAL DECISION MAKING: Stable/uncomplicated  EVALUATION COMPLEXITY: Low   GOALS: Goals reviewed with patient? Yes  SHORT TERM GOALS: Target date: 11/27/2022   Pt will be ind with HEP Baseline: Goal status: INITIAL  2.  Pt will demo L = R wrist ROM Baseline:  Goal status: INITIAL   LONG TERM GOALS: Target date: 12/25/2022   Pt will be ind with management of symptoms and progression of HEP Baseline:  Goal status: INITIAL  2.  Pt will report no wrist pain with all activities Baseline:  Goal status: INITIAL  3.  Pt will demo pain free shoulder MMT of at least 4+/5 for improved carrying/lifting at work Baseline:  Goal status: INITIAL  4.  Pt will have L = R grip strength  Baseline:  Goal status: INITIAL   PLAN:  PT FREQUENCY: 1x/week  PT DURATION: 8 weeks  PLANNED INTERVENTIONS: Therapeutic exercises, Therapeutic activity, Neuromuscular re-education, Balance training, Gait training, Patient/Family education, Self Care, Joint mobilization, Dry Needling, Electrical stimulation, Cryotherapy, Moist heat, Taping, Ultrasound, Ionotophoresis '4mg'$ /ml Dexamethasone, Manual therapy, and Re-evaluation  PLAN FOR NEXT SESSION: Assess response to HEP. Gentle manual work through extensor bundle (consider TPDN). Initiate wrist eccentrics. Continue shoulder/scapular strengthening.   Demarian Epps April Ma L Sophee Mckimmy, PT 10/30/2022, 10:47 AM

## 2022-10-30 NOTE — Progress Notes (Signed)
No egg or soy allergy known to patient  No issues known to pt with past sedation with any surgeries or procedures Patient denies ever being told they had issues or difficulty with intubation  No FH of Malignant Hyperthermia Pt is not on diet pills Pt is not on  home 02  Pt is not on blood thinners  Pt denies issues with constipation  No A fib or A flutter Have any cardiac testing pending-- NO Pt instructed to use [email or web address] or GoodRx for a price reduction on prep   Patient's chart reviewed by Osvaldo Angst CNRA prior to previsit and patient appropriate for the [place].  Previsit completed and red dot placed by patient's name on their procedure day (on provider's schedule).

## 2022-11-01 ENCOUNTER — Encounter: Payer: Self-pay | Admitting: Gastroenterology

## 2022-11-05 NOTE — Therapy (Addendum)
 OUTPATIENT PHYSICAL THERAPY TREATMENT NOTEDischarge   Patient Name: Krista Cross MRN: [id number] DOB:September 18, 1970, 52 y.o., female Today's Date: 11/06/2022  PCP: [place], Betty G, MD  REFERRING PROVIDER: [place], Betty G, MD   END OF SESSION:   PT End of Session - 11/06/22 0806     Visit Number 2    Number of Visits 8    Date for PT Re-Evaluation 12/25/22    Authorization Type [employer]    PT Start Time 0803    PT Stop Time 0845    PT Time Calculation (min) 42 min    Activity Tolerance Patient tolerated treatment well    Behavior During Therapy [facility] for tasks assessed/performed             Past Medical History:  Diagnosis Date   Cervical adenocarcinoma (HCC)    History of abnormal cervical Pap smear    w/ cryoablation in 2000   Varicose veins    Past Surgical History:  Procedure Laterality Date   CERVICAL CONIZATION W/BX N/A 08/31/2015   Procedure: CONIZATION CERVIX WITH BIOPSY;  Surgeon: Maurilio Ship, MD;  Location: [facility];  Service: Gynecology;  Laterality: N/A;   CESAREAN SECTION  07-26-2003  &  12-28-2007   Bilateral Tubal Ligation with last one   ENDOVENOUS ABLATION SAPHENOUS VEIN W/ LASER Right 05-05-2014   EVLA RIGHT GREATER SAPHENOUS VEIN  BY TODD EARLY MD   ROBOTIC ASSISTED TOTAL HYSTERECTOMY WITH BILATERAL SALPINGO OOPHERECTOMY Bilateral 10/17/2015   Procedure: XI ROBOTIC ASSISTED TYPE III RADICAL TOTAL HYSTERECTOMY WITH BILATERAL SALPINGECTOMY OOPHORECTOMY WITH SENTINEL LYMPH NODE BIOPSY;  Surgeon: Maurilio Ship, MD;  Location: [facility];  Service: Gynecology;  Laterality: Bilateral;   Patient Active Problem List   Diagnosis Date Noted   Acute upper respiratory infection 10/30/2022   Acute urinary tract infection 10/30/2022   Allergic contact dermatitis 10/30/2022   Chronic right-sided low back pain without sciatica 10/14/2022   Cervical cancer, FIGO stage IB1 (HCC) 02/09/2018   Hypertrophy of tonsil-Right 06/06/2017   Surgical menopause  05/22/2016   Cervical cancer (HCC) 10/17/2015   Cervical cancer, FIGO stage IA2 (HCC) 08/18/2015   AGUS favor benign 07/24/2015   Vitamin D  deficiency 07/06/2015   Atypical glandular cells of undetermined significance (AGUS) on cervical Pap smear 07/06/2015   Cervical cancer screening 02/27/2015   Varicose veins of bilateral lower extremities with other complications 01/07/2014   Routine general medical examination at a health care facility 12/07/2013   TINEA CORPORIS 10/27/2009   SPRAIN&STRAIN UNSPEC SITE SHOULDER&UPPER ARM 10/27/2009   MICROSCOPIC HEMATURIA 09/19/2009    REFERRING DIAG: L shoulder impingement   THERAPY DIAG:  Pain in left wrist  Pain in left elbow  Stiffness of left wrist, not elsewhere classified  Muscle weakness (generalized)  Rationale for Evaluation and Treatment Rehabilitation  ONSET DATE: ~6 months   SUBJECTIVE:  SUBJECTIVE STATEMENT: Pt reports a little improvement in pain. Pt indicates pain along the course of the extensor carpi ulnaris. Pt reports consistent completion of HEP.   PAIN:  Are you having pain? Yes: NPRS scale: 4 in elbow 6 in wrist/10 Pain location: L medial wrist and lateral elbow Pain description: sharp Aggravating factors: Using left wrist Relieving factors: Has not tried any relieving factors  PERTINENT HISTORY: Left hand dominant, arthritis   PRECAUTIONS: None   WEIGHT BEARING RESTRICTIONS: No   FALLS:  Has patient fallen in last 6 months? No   LIVING ENVIRONMENT: Lives with: lives with their family and lives with their spouse Lives in: House/apartment   OCCUPATION: Works in [employer] -- has to carry a lot of things   PLOF: Independent   PATIENT GOALS:Improve pain   NEXT MD VISIT:    OBJECTIVE: (objective measures  completed at initial evaluation unless otherwise dated)   DIAGNOSTIC FINDINGS:  Ordered x-rays for wrist   PATIENT SURVEYS:  N/A -- pt is Spanish speaking   COGNITION: Overall cognitive status: Within functional limits for tasks assessed                                  SENSATION: WFL -- sometimes fingers will go numb in the mornings   POSTURE: Rounded shoulder   UPPER EXTREMITY ROM:    Active ROM Right eval Left eval  Shoulder flexion   >150  Shoulder extension      Shoulder abduction   >150  Shoulder adduction      Shoulder internal rotation      Shoulder external rotation      Elbow flexion      Elbow extension      Wrist flexion 60 40  Wrist extension 80 60*  Wrist ulnar deviation      Wrist radial deviation 30 30  Wrist pronation      Wrist supination      (Blank rows = not tested) * = pain   UPPER EXTREMITY MMT:   MMT Right eval Left eval  Shoulder flexion 5 4*  Shoulder extension 5 5  Shoulder abduction 5 4*  Shoulder adduction      Shoulder internal rotation 5 3+*  Shoulder external rotation 5 3+*  Middle trapezius      Lower trapezius      Elbow flexion 5 5  Elbow extension 5 5  Wrist flexion 5 5  Wrist extension 5 4+  Wrist ulnar deviation      Wrist radial deviation      Wrist pronation      Wrist supination      Grip strength (lbs) 40, 45, 35 30, 25, 32  (Blank rows = not tested) * = reproduced pain in elbow/wrist   SHOULDER SPECIAL TESTS: Impingement tests: Neer impingement test: positive  SLAP lesions: Biceps load test: negative Instability tests: did not assess Rotator cuff assessment: Empty can test: positive  Biceps assessment: Speed's test: negative   JOINT MOBILITY TESTING:  L wrist hypomobile   PALPATION:  TTP L forearm extensor bundle             TODAY'S TREATMENT:    [facility] Adult PT Treatment:  DATE: 11/06/22 Therapeutic Exercise: Wrist extensors c elbow ext stretch 2x  30 Wrist  flexors c elbow ext stretch 2x 30 Wrist extensors ecc c 2# 2x10 Wrist supinators ecc 2# 2x10 Bilat shoulder ER YTB 2x10 Shoulder IR YTB 2x10 Updated HEP Manual Therapy: Cross friction massage to L wrist group bundle Self Care: For cross friction massage. Complete strengthening exs every other day, pain increase with therex should be minimal and not extend to the next day. Massage and stretching there ex daily. General course of recovery for lateral epicondylitis.                                                                                                                                       DATE: 10/30/22 See HEP below  PATIENT EDUCATION: Education details: Exam findings, POC, use of ice/heat, self massage Person educated: Patient Education method: Explanation, Demonstration, and Handouts Education comprehension: verbalized understanding, returned demonstration, and needs further education   HOME EXERCISE PROGRAM: Access Code: [email or web address] URL: [email or web address] Date: 11/06/2022 Prepared by: Dasie Daft  Exercises - Seated Wrist Flexion Stretch  - 3 x daily - 7 x weekly - 2 sets - 30 sec hold - Seated Wrist Extension Stretch  - 3 x daily - 7 x weekly - 2 sets - 30 sec hold - Standing Shoulder External Rotation with Resistance  - 1 x daily - 7 x weekly - 2 sets - 10 reps - Shoulder Internal Rotation with Resistance  - 1 x daily - 7 x weekly - 3 sets - 10 reps - Seated Eccentric Wrist Extension  - 1 x daily - 7 x weekly - 3 sets - 10 reps - Forearm Pronation with Dumbbell  - 1 x daily - 7 x weekly - 3 sets - 10 reps - Seated Elbow Manual Massage Counterclockwise  - 1 x daily - 7 x weekly - 2 sets - 10 reps   ASSESSMENT:   CLINICAL IMPRESSION: Reviewed and completed HEP and cross friction massage. Eccentric strengthening therex were added for the L wrist extensors and supinators for eccentric control. Pt returned demonstration of therex and voiced  understanding of self care. Pt tolerated PT today without adverse effects. Pt will continue to benefit from skilled PT to address impairments for improved function.    OBJECTIVE IMPAIRMENTS: decreased activity tolerance, decreased mobility, decreased ROM, decreased strength, increased fascial restrictions, increased muscle spasms, impaired UE functional use, improper body mechanics, and pain.    ACTIVITY LIMITATIONS: carrying, lifting, reach over head, and hygiene/grooming   PARTICIPATION LIMITATIONS: meal prep, cleaning, laundry, and occupation   PERSONAL FACTORS: Past/current experiences and Profession are also affecting patient's functional outcome.    REHAB POTENTIAL: Good   CLINICAL DECISION MAKING: Stable/uncomplicated   EVALUATION COMPLEXITY: Low     GOALS: Goals reviewed with patient? Yes   SHORT TERM GOALS: Target date: 11/27/2022     Pt will be ind with  HEP Baseline: Goal status: Ongoing   2.  Pt will demo L = R wrist ROM Baseline:  Goal status: INITIAL     LONG TERM GOALS: Target date: 12/25/2022     Pt will be ind with management of symptoms and progression of HEP Baseline:  Goal status: INITIAL   2.  Pt will report no wrist pain with all activities Baseline:  Goal status: INITIAL   3.  Pt will demo pain free shoulder MMT of at least 4+/5 for improved carrying/lifting at work Baseline:  Goal status: INITIAL   4.  Pt will have L = R grip strength  Baseline:  Goal status: INITIAL     PLAN:   PT FREQUENCY: 1x/week   PT DURATION: 8 weeks   PLANNED INTERVENTIONS: Therapeutic exercises, Therapeutic activity, Neuromuscular re-education, Balance training, Gait training, Patient/Family education, Self Care, Joint mobilization, Dry Needling, Electrical stimulation, Cryotherapy, Moist heat, Taping, Ultrasound, Ionotophoresis 4mg /ml Dexamethasone , Manual therapy, and Re-evaluation   PLAN FOR NEXT SESSION: Assess response to HEP. Gentle manual work through  extensor bundle (consider TPDN). Initiate wrist eccentrics. Continue shoulder/scapular strengthening. Consider KT taping.    Lynnlee Revels MS, PT 11/06/22 9:19 AM  PHYSICAL THERAPY DISCHARGE SUMMARY  Visits from Start of Care: 2  Current functional level related to goals / functional outcomes: unknown   Remaining deficits: unknown   Education / Equipment: HEP   Patient agrees to discharge. Patient goals were not met. Patient is being discharged due to not returning since the last visit. Tauheed Mcfayden MS, PT 06/10/24 3:59 PM

## 2022-11-06 ENCOUNTER — Ambulatory Visit: Payer: Commercial Managed Care - HMO | Attending: Family Medicine

## 2022-11-06 DIAGNOSIS — M25632 Stiffness of left wrist, not elsewhere classified: Secondary | ICD-10-CM | POA: Diagnosis present

## 2022-11-06 DIAGNOSIS — M25532 Pain in left wrist: Secondary | ICD-10-CM | POA: Insufficient documentation

## 2022-11-06 DIAGNOSIS — M25522 Pain in left elbow: Secondary | ICD-10-CM | POA: Insufficient documentation

## 2022-11-06 DIAGNOSIS — M6281 Muscle weakness (generalized): Secondary | ICD-10-CM | POA: Insufficient documentation

## 2022-11-20 ENCOUNTER — Ambulatory Visit: Payer: Commercial Managed Care - HMO | Admitting: Physical Therapy

## 2022-11-21 ENCOUNTER — Telehealth: Payer: Self-pay | Admitting: Gastroenterology

## 2022-11-21 DIAGNOSIS — Z1211 Encounter for screening for malignant neoplasm of colon: Secondary | ICD-10-CM

## 2022-11-21 MED ORDER — NA SULFATE-K SULFATE-MG SULF 17.5-3.13-1.6 GM/177ML PO SOLN: 1.0000 | Freq: Once | ORAL | 0 refills | Status: AC

## 2022-11-21 NOTE — Telephone Encounter (Signed)
Call to pt, spoke with dtr, pt pharmacy does not have rx for suprep, rx sent over now, instruct her to call in the morning if she has not received it

## 2022-11-21 NOTE — Telephone Encounter (Signed)
Patient called states she still has not received her prep medication. Procedure scheduled for 11/25/22.

## 2022-11-25 ENCOUNTER — Encounter: Payer: Self-pay | Admitting: Gastroenterology

## 2022-11-25 ENCOUNTER — Ambulatory Visit (AMBULATORY_SURGERY_CENTER): Payer: Commercial Managed Care - HMO | Admitting: Gastroenterology

## 2022-11-25 VITALS — BP 111/67 | HR 70 | Temp 98.1°F | Resp 16 | Ht <= 58 in | Wt 147.0 lb

## 2022-11-25 DIAGNOSIS — Z1211 Encounter for screening for malignant neoplasm of colon: Secondary | ICD-10-CM | POA: Diagnosis not present

## 2022-11-25 MED ORDER — SODIUM CHLORIDE 0.9 % IV SOLN: 500.0000 mL | Freq: Once | INTRAVENOUS | Status: DC

## 2022-11-25 NOTE — Progress Notes (Unsigned)
Vss nad trans to pacu 

## 2022-11-25 NOTE — Patient Instructions (Addendum)
Resume previous diet. Continue present medications. Repeat colonoscopy in 10 years for surveillance.   USTED TUVO UN PROCEDIMIENTO ENDOSCPICO HOY EN EL [facility]:   Lea el informe del procedimiento que se le entreg para cualquier pregunta especfica sobre lo que se Primary school teacher.  Si el informe del examen no responde a sus preguntas, por favor llame a su gastroenterlogo para aclararlo.  Si usted solicit que no se le den [employer] de lo que se Estate manager/land agent en su procedimiento al [employer] va a cuidar, entonces el informe del procedimiento se ha incluido en un sobre sellado para que usted lo revise despus cuando le sea ms conveniente.   LO QUE PUEDE ESPERAR: Algunas sensaciones de hinchazn en el abdomen.  Puede tener ms gases de lo normal.  El caminar puede ayudarle a eliminar el aire que se le puso en el tracto gastrointestinal durante el procedimiento y reducir la hinchazn.  Si le hicieron una endoscopia inferior (como una colonoscopia o una sigmoidoscopia flexible), podra notar manchas de sangre en las heces fecales o en el papel higinico.  Si se someti a una preparacin intestinal para su procedimiento, es posible que no tenga una evacuacin intestinal normal durante [employer].   Tenga en cuenta:  Es posible que note un poco de irritacin y congestin en la nariz o algn drenaje.  Esto es debido al oxgeno [employer] durante su procedimiento.  No hay que preocuparse y esto debe desaparecer ms o Scientist, research (medical).   SNTOMAS PARA REPORTAR INMEDIATAMENTE:  Despus de una endoscopia inferior (colonoscopia o sigmoidoscopia flexible):  Cantidades excesivas de sangre en las heces fecales  Sensibilidad significativa o empeoramiento de los dolores abdominales   Hinchazn aguda del abdomen que antes no tena   Fiebre de 100F o ms    Para asuntos urgentes o de Freight forwarder, puede comunicarse con un gastroenterlogo a cualquier hora llamando al [phone].  DIETA:  Recomendamos una comida pequea al principio, pero luego puede continuar con su dieta normal.  Tome muchos lquidos, Teacher, adult education las bebidas alcohlicas durante 24 horas.    ACTIVIDAD:  Debe planear tomarse las cosas con calma por el resto del da y no debe CONDUCIR ni usar maquinaria pesada Programmer, applications (debido a los medicamentos de sedacin utilizados durante el examen).     SEGUIMIENTO: Nuestro personal llamar al nmero que aparece en su historial al siguiente da hbil de su procedimiento para ver cmo se siente y para responder cualquier pregunta o inquietud que pueda tener con respecto a la informacin que se le dio despus del procedimiento. Si no podemos contactarle, le dejaremos un mensaje.  Sin embargo, si se siente bien y no tiene Paediatric nurse, no es necesario que nos devuelva la llamada.  Asumiremos que ha regresado a sus actividades diarias normales sin incidentes. Si se le tomaron algunas biopsias, le contactaremos por telfono o por carta en las prximas 3 semanas.  Si no ha sabido [employer] biopsias en el transcurso de 3 semanas, por favor llmenos al [phone].   FIRMAS/CONFIDENCIALIDAD: Usted y/o el acompaante que le cuide han firmado documentos que se ingresarn en su historial mdico electrnico.  Estas firmas atestiguan el hecho de que la informacin anterior

## 2022-11-25 NOTE — Progress Notes (Signed)
Pt's states no medical or surgical changes since previsit or office visit.  Admitting intake with the assistance of Ms [facility] interpreter.

## 2022-11-25 NOTE — Progress Notes (Unsigned)
[facility] History and Physical   Primary Care Physician:  [place], Betty G, MD   Reason for Procedure:  Colorectal cancer screening  Plan:    Screening colonoscopy with possible interventions as needed     HPI: Krista Cross is a very pleasant 52 y.o. female here for screening colonoscopy. Denies any nausea, vomiting, abdominal pain, melena or bright red blood per rectum  The risks and benefits as well as alternatives of endoscopic procedure(s) have been discussed and reviewed. All questions answered. The patient agrees to proceed.    Past Medical History:  Diagnosis Date   Cervical adenocarcinoma ([place])    History of abnormal cervical Pap smear    w/ cryoablation in 2000   Varicose veins     Past Surgical History:  Procedure Laterality Date   CERVICAL CONIZATION W/BX N/A 08/31/2015   Procedure: CONIZATION CERVIX WITH BIOPSY;  Surgeon: Everitt Amber, MD;  Location: [place];  Service: Gynecology;  Laterality: N/A;   CESAREAN SECTION  07-26-2003  &  12-28-2007   Bilateral Tubal Ligation with last one   ENDOVENOUS ABLATION SAPHENOUS VEIN W/ LASER Right 05-05-2014   EVLA RIGHT GREATER SAPHENOUS VEIN  BY TODD EARLY MD   ROBOTIC ASSISTED TOTAL HYSTERECTOMY WITH BILATERAL SALPINGO OOPHERECTOMY Bilateral 10/17/2015   Procedure: XI ROBOTIC ASSISTED TYPE III RADICAL TOTAL HYSTERECTOMY WITH BILATERAL SALPINGECTOMY OOPHORECTOMY WITH SENTINEL LYMPH NODE BIOPSY;  Surgeon: Everitt Amber, MD;  Location: [facility];  Service: Gynecology;  Laterality: Bilateral;    Prior to Admission medications   Medication Sig Start Date End Date Taking? Authorizing Provider  meloxicam (MOBIC) 7.5 MG tablet Take 1-2 tablets (7.5-15 mg total) by mouth daily as needed for pain. Patient not taking: Reported on 11/25/2022 10/14/22   [place], Betty G, MD  Multiple Vitamins-Minerals (MULTIVITAMIN WITH MINERALS) tablet Take 1 tablet by mouth daily.    [provider]     Current Outpatient Medications  Medication Sig Dispense Refill   meloxicam (MOBIC) 7.5 MG tablet Take 1-2 tablets (7.5-15 mg total) by mouth daily as needed for pain. (Patient not taking: Reported on 11/25/2022) 30 tablet 1   Multiple Vitamins-Minerals (MULTIVITAMIN WITH MINERALS) tablet Take 1 tablet by mouth daily.     Current Facility-Administered Medications  Medication Dose Route Frequency Provider Last Rate Last Admin   0.9 %  sodium chloride infusion  500 mL Intravenous Once Mauri Pole, MD        Allergies as of 11/25/2022   (No Active Allergies)    Family History  Problem Relation Age of Onset   Hyperlipidemia Mother    Diabetes Mother    Varicose Veins Sister    Lupus Sister    Colon cancer Neg Hx    Colon polyps Neg Hx    Esophageal cancer Neg Hx    Rectal cancer Neg Hx    Stomach cancer Neg Hx     Social History   Socioeconomic History   Marital status: Married    Spouse name: Not on file   Number of children: Not on file   Years of education: Not on file   Highest education level: Not on file  Occupational History   Not on file  Tobacco Use   Smoking status: Never   Smokeless tobacco: Never  Vaping Use   Vaping Use: Never used  Substance and Sexual Activity   Alcohol use: No   Drug use: No   Sexual activity: Yes    Partners: Male  Birth control/protection: Surgical    Comment: 1st intercourse- 49, partner- 1, hysterectomy  Other Topics Concern   Not on file  Social History Narrative   Not on file   Social Determinants of Health   Financial Resource Strain: Not on file  Food Insecurity: Not on file  Transportation Needs: Not on file  Physical Activity: Not on file  Stress: Not on file  Social Connections: Not on file  Intimate Partner Violence: Not on file    Review of Systems:  All other review of systems negative except as mentioned in the HPI.  Physical Exam: Vital signs in last 24 hours: Blood Pressure 122/69    Pulse 66   Temperature 98.1 F (36.7 C)   Respiration 15   Height 4\' 10"  ([id number] m)   Weight 147 lb (66.7 kg)   Last Menstrual Period 09/25/2015   Oxygen Saturation 100%   Body Mass Index 30.72 kg/m  General:   Alert, NAD Lungs:  Clear .   Heart:  Regular rate and rhythm Abdomen:  Soft, nontender and nondistended. Neuro/Psych:  Alert and cooperative. Normal mood and affect. A and O x 3  Reviewed labs, radiology imaging, old records and pertinent past GI work up  Patient is appropriate for planned procedure(s) and anesthesia in an ambulatory setting   K. Denzil Magnuson , MD [phone]

## 2022-11-25 NOTE — Op Note (Signed)
[name] Procedure Date: 11/25/2022 8:01 AM MRN: [id number] Endoscopist: Mauri Pole , MD, [id number] Age: 52 Referring MD:  Date of Birth: [birth year] Gender: Female Account #: [id number] Procedure:                Colonoscopy Indications:              Screening for colorectal malignant neoplasm Medicines:                Monitored Anesthesia Care Procedure:                Pre-Anesthesia Assessment:                           - Prior to the procedure, a History and Physical                            was performed, and patient medications and                            allergies were reviewed. The patient's tolerance of                            previous anesthesia was also reviewed. The risks                            and benefits of the procedure and the sedation                            options and risks were discussed with the patient.                            All questions were answered, and informed consent                            was obtained. Prior Anticoagulants: The patient has                            taken no anticoagulant or antiplatelet agents. ASA                            Grade Assessment: II - A patient with mild systemic                            disease. After reviewing the risks and benefits,                            the patient was deemed in satisfactory condition to                            undergo the procedure.                           After obtaining informed consent, the colonoscope  was passed under direct vision. Throughout the                            procedure, the patient's blood pressure, pulse, and                            oxygen saturations were monitored continuously. The                            [id number]-HQ190L Colonoscope [id number] was introduced                            through the anus and advanced to the the cecum,                             identified by appendiceal orifice and ileocecal                            valve. The colonoscopy was performed without                            difficulty. The patient tolerated the procedure                            well. The quality of the bowel preparation was                            good. The ileocecal valve, appendiceal orifice, and                            rectum were photographed. Scope In: 8:11:25 AM Scope Out: 8:25:02 AM Scope Withdrawal Time: 0 hours 9 minutes 15 seconds  Total Procedure Duration: 0 hours 13 minutes 37 seconds  Findings:                 The perianal and digital rectal examinations were                            normal.                           Non-bleeding external and internal hemorrhoids were                            found during retroflexion. The hemorrhoids were                            medium-sized.                           The exam was otherwise without abnormality. Complications:            No immediate complications. Estimated Blood Loss:     Estimated blood loss was minimal. Impression:               - Non-bleeding external and internal hemorrhoids.                           -  The examination was otherwise normal.                           - No specimens collected.                           - The GI Genius (intelligent endoscopy module),                            computer-aided polyp detection system powered by AI                            was utilized to detect colorectal polyps through                            enhanced visualization during colonoscopy. Recommendation:           - Resume previous diet.                           - Continue present medications.                           - Repeat colonoscopy in 10 years for surveillance. Mauri Pole, MD 11/25/2022 8:31:49 AM This report has been signed electronically.

## 2022-11-25 NOTE — Progress Notes (Unsigned)
Interpreter used today at the [facility] for this pt.  Interpreter's name is- Roquel

## 2022-11-26 ENCOUNTER — Telehealth: Payer: Self-pay

## 2022-11-26 NOTE — Telephone Encounter (Signed)
  Follow up Call-     11/25/2022    7:07 AM  Call back number  Post procedure Call Back phone  # [phone]  Permission to leave phone message Yes     Patient questions:  Do you have a fever, pain , or abdominal swelling? No. Pain Score  0 *  Have you tolerated food without any problems? Yes.    Have you been able to return to your normal activities? Yes.    Do you have any questions about your discharge instructions: Diet   No. Medications  No. Follow up visit  No.  Do you have questions or concerns about your Care? No.  Actions: * If pain score is 4 or above: No action needed, pain <4.

## 2023-02-11 NOTE — Progress Notes (Signed)
HPI: [name] is a 52 y.o. female, who is here today for chronic disease management.  Last seen on 10/14/22 Last visit meloxicam 7.5 mg 1 to 2 tablets daily as needed was recommended for lower back pain and arthralgias. Joint pain has improved, still has left shoulder pain with certain movement, she completed PT and occasionally does exercises at home.  Meloxicam helped with pain, no side effects reported.  Today she is complaining of left ankle pain, which started a couple weeks ago. No history of trauma. She has not noted edema or erythema. Occasionally she twisted her ankle while walking. MTP pain, specially great toe.  She is concerned about possible lupus, her sister has been treated for the disease. She also reports fatigue and hair loss, both problems has been going on for a while.  She also reports having colonoscopy in 11/2022, she would like to go through results.  Review of Systems  Constitutional:  Positive for fatigue. Negative for chills and fever.  HENT:  Negative for mouth sores and sore throat.   Respiratory:  Negative for cough, shortness of breath and wheezing.   Cardiovascular:  Negative for chest pain and palpitations.  Gastrointestinal:  Negative for abdominal pain, nausea and vomiting.  Skin:  Negative for rash.  Neurological:  Negative for weakness and numbness.  See other pertinent positives and negatives in HPI.  Current Outpatient Medications on File Prior to Visit  Medication Sig Dispense Refill   Multiple Vitamins-Minerals (MULTIVITAMIN WITH MINERALS) tablet Take 1 tablet by mouth daily.     No current facility-administered medications on file prior to visit.   Past Medical History:  Diagnosis Date   Cervical adenocarcinoma (HCC)    History of abnormal cervical Pap smear    w/ cryoablation in 2000   Varicose veins    No Active Allergies  Social History   Socioeconomic History   Marital status: Married    Spouse name: Not  on file   Number of children: Not on file   Years of education: Not on file   Highest education level: Not on file  Occupational History   Not on file  Tobacco Use   Smoking status: Never   Smokeless tobacco: Never  Vaping Use   Vaping Use: Never used  Substance and Sexual Activity   Alcohol use: No   Drug use: No   Sexual activity: Yes    Partners: Male    Birth control/protection: Surgical    Comment: 1st intercourse- 79, partner- 1, hysterectomy  Other Topics Concern   Not on file  Social History Narrative   Not on file   Social Determinants of Health   Financial Resource Strain: Not on file  Food Insecurity: Not on file  Transportation Needs: Not on file  Physical Activity: Not on file  Stress: Not on file  Social Connections: Not on file    Vitals:   02/12/23 0701  BP: 128/80  Pulse: 76  Temp: 98.2 F (36.8 C)  SpO2: 96%   Body mass index is 30.41 kg/m.  Physical Exam Vitals and nursing note reviewed.  Constitutional:      General: She is not in acute distress.    Appearance: She is well-developed.  HENT:     Head: Normocephalic and atraumatic.     Mouth/Throat:     Mouth: Mucous membranes are moist.     Pharynx: Oropharynx is clear.  Eyes:     Conjunctiva/sclera: Conjunctivae normal.  Cardiovascular:  Rate and Rhythm: Normal rate and regular rhythm.     Pulses:          Dorsalis pedis pulses are 2+ on the right side and 2+ on the left side.     Heart sounds: No murmur heard. Pulmonary:     Effort: Pulmonary effort is normal. No respiratory distress.     Breath sounds: Normal breath sounds.  Abdominal:     Palpations: Abdomen is soft. There is no hepatomegaly or mass.     Tenderness: There is no abdominal tenderness.  Musculoskeletal:     Right shoulder: Normal range of motion.     Left shoulder: Tenderness (with movement.) present. No bony tenderness. Normal range of motion.     Left ankle: No swelling or deformity. Tenderness present  over the lateral malleolus and medial malleolus. No base of 5th metatarsal or proximal fibula tenderness. Normal range of motion. Normal pulse.     Left Achilles Tendon: No tenderness.  Skin:    General: Skin is warm.     Findings: No erythema or rash.  Neurological:     General: No focal deficit present.     Mental Status: She is alert and oriented to person, place, and time.     Cranial Nerves: No cranial nerve deficit.     Gait: Gait normal.  Psychiatric:        Mood and Affect: Mood and affect normal.   ASSESSMENT AND PLAN:  [name] was seen today for medical management of chronic issues.  Diagnoses and all orders for this visit: Lab Results  Component Value Date   CRP <1.0 02/12/2023   Polyarthralgia Assessment & Plan: We discussed possible etiologies, most likely generalized OA. Because family history of rheumatologic disorder, sister with lupus, rheumatology workup ordered today.  Further recommendation will be given according to lab results. For now she will continue meloxicam 7.5 mg 1 to 2 tablets daily as needed.  We discussed some side effects of medication.  Orders: -     C-reactive protein; Future -     Rheumatoid factor; Future -     Cyclic citrul peptide antibody, IgG -     ANA; Future -     Meloxicam; Take 1-2 tablets (7.5-15 mg total) by mouth daily as needed for pain.  Dispense: 30 tablet; Refill: 2  Left ankle pain, unspecified chronicity ? Tenosynovitis, OA are some to consider. I do not think imaging is needed at this time. ROM exercises recommended,ABC a few times per week.  Encounter for HCV screening test for low risk patient -     Hepatitis C antibody; Future  Need for shingles vaccine -     Varicella-zoster vaccine IM  We reviewed colonoscopy report, 10-year follow-up was recommended.  Return in about 6 months (around 08/22/2023) for CPE.  Krista Sanville G. [place], MD  [facility]. [place] office.

## 2023-02-12 ENCOUNTER — Encounter: Payer: Self-pay | Admitting: Family Medicine

## 2023-02-12 ENCOUNTER — Ambulatory Visit (INDEPENDENT_AMBULATORY_CARE_PROVIDER_SITE_OTHER): Payer: Commercial Managed Care - HMO | Admitting: Family Medicine

## 2023-02-12 VITALS — BP 128/80 | HR 76 | Temp 98.2°F | Ht <= 58 in | Wt 145.5 lb

## 2023-02-12 DIAGNOSIS — R768 Other specified abnormal immunological findings in serum: Secondary | ICD-10-CM | POA: Diagnosis not present

## 2023-02-12 DIAGNOSIS — M25572 Pain in left ankle and joints of left foot: Secondary | ICD-10-CM | POA: Diagnosis not present

## 2023-02-12 DIAGNOSIS — M2550 Pain in unspecified joint: Secondary | ICD-10-CM

## 2023-02-12 DIAGNOSIS — R7689 Other specified abnormal immunological findings in serum: Secondary | ICD-10-CM

## 2023-02-12 DIAGNOSIS — Z1159 Encounter for screening for other viral diseases: Secondary | ICD-10-CM | POA: Diagnosis not present

## 2023-02-12 DIAGNOSIS — Z23 Encounter for immunization: Secondary | ICD-10-CM | POA: Diagnosis not present

## 2023-02-12 LAB — C-REACTIVE PROTEIN: CRP: <1 mg/dL (ref 0.5–20.0)

## 2023-02-12 MED ORDER — MELOXICAM 7.5 MG PO TABS: 7.5000 mg | ORAL_TABLET | Freq: Every day | ORAL | 2 refills | Status: DC | PRN

## 2023-02-12 NOTE — Patient Instructions (Addendum)
A few things to remember from today's visit:  Encounter for HCV screening test for low risk patient - Plan: Hepatitis C antibody  Polyarthralgia - Plan: C-reactive protein, Rheumatoid factor, Cyclic citrul peptide antibody, IgG, ANA  Left ankle pain, unspecified chronicity Continue Meloxicam diario si lo necesita. Ejercicios del tobillo izq, ABC.  If you need refills for medications you take chronically, please call your pharmacy. Do not use My Chart to request refills or for acute issues that need immediate attention. If you send a my chart message, it may take a few days to be addressed, specially if I am not in the office.  Please be sure medication list is accurate. If a new problem present, please set up appointment sooner than planned today.

## 2023-02-12 NOTE — Assessment & Plan Note (Signed)
We discussed possible etiologies, most likely generalized OA. Because family history of rheumatologic disorder, sister with lupus, rheumatology workup ordered today.  Further recommendation will be given according to lab results. For now she will continue meloxicam 7.5 mg 1 to 2 tablets daily as needed.  We discussed some side effects of medication.

## 2023-02-14 LAB — HEPATITIS C ANTIBODY: Hepatitis C Ab: NONREACTIVE

## 2023-02-14 LAB — ANTI-NUCLEAR AB-TITER (ANA TITER): ANA Titer 1: 1:80 {titer} — ABNORMAL HIGH

## 2023-02-14 LAB — RHEUMATOID FACTOR: Rheumatoid fact SerPl-aCnc: <10 IU/mL (ref <14)

## 2023-02-14 LAB — CYCLIC CITRUL PEPTIDE ANTIBODY, IGG: Cyclic Citrullin Peptide Ab: <16 UNITS

## 2023-02-14 LAB — ANA: Anti Nuclear Antibody (ANA): POSITIVE — AB

## 2023-07-23 ENCOUNTER — Ambulatory Visit: Payer: Managed Care, Other (non HMO) | Admitting: Family Medicine

## 2023-07-23 ENCOUNTER — Encounter: Payer: Self-pay | Admitting: Family Medicine

## 2023-07-23 VITALS — BP 120/70 | HR 85 | Temp 98.2°F | Resp 12 | Ht <= 58 in | Wt 146.0 lb

## 2023-07-23 DIAGNOSIS — R1030 Lower abdominal pain, unspecified: Secondary | ICD-10-CM | POA: Diagnosis not present

## 2023-07-23 DIAGNOSIS — N390 Urinary tract infection, site not specified: Secondary | ICD-10-CM | POA: Diagnosis not present

## 2023-07-23 DIAGNOSIS — R300 Dysuria: Secondary | ICD-10-CM | POA: Diagnosis not present

## 2023-07-23 DIAGNOSIS — M546 Pain in thoracic spine: Secondary | ICD-10-CM

## 2023-07-23 LAB — POC URINALSYSI DIPSTICK (AUTOMATED)
Bilirubin, UA: NEGATIVE
Blood, UA: POSITIVE
Glucose, UA: NEGATIVE
Ketones, UA: NEGATIVE
Nitrite, UA: NEGATIVE
Protein, UA: NEGATIVE
Spec Grav, UA: 1.02 (ref 1.010–1.025)
Urobilinogen, UA: 0.2 U/dL — AB
pH, UA: 6 (ref 5.0–8.0)

## 2023-07-23 MED ORDER — SULFAMETHOXAZOLE-TRIMETHOPRIM 800-160 MG PO TABS: 1.0000 | ORAL_TABLET | Freq: Two times a day (BID) | ORAL | 0 refills | Status: AC

## 2023-07-23 NOTE — Patient Instructions (Addendum)
A few things to remember from today's visit:  Dysuria - Plan: POCT Urinalysis Dipstick (Automated), Culture, Urine  Urinary tract infection without hematuria, site unspecified - Plan: sulfamethoxazole-trimethoprim (BACTRIM DS) 800-160 MG tablet  Lower abdominal pain  Right-sided thoracic back pain, unspecified chronicity El dolor de estomago ouede ser relacionado con la orine pero puede ser otra cose. Si tiene sintomas nuevos me have saber. El dolor de espalda parece muscular. Apliquese icy ho o asper cream.  Do not use My Chart to request refills or for acute issues that need immediate attention. If you send a my chart message, it may take a few days to be addressed, specially if I am not in the office.  Please be sure medication list is accurate. If a new problem present, please set up appointment sooner than planned today.

## 2023-07-23 NOTE — Progress Notes (Signed)
ACUTE VISIT Chief Complaint  Patient presents with   Urinary Tract Infection    Painful urination started Thursday    HPI: [name] is a 52 y.o. female with a PMHx significant for varicose veins of bilateral lower extremities, right tonsil hypertrophy, cervical cancer, polyarthralgia, and vitamin D deficiency, who is here today complaining of suprapubic pain and dysuria.   Dysuria  This is a new problem. The current episode started in the past 7 days. The problem has been unchanged. The quality of the pain is described as burning. The pain is moderate. There has been no fever. She is Sexually active. There is No history of pyelonephritis. Associated symptoms include frequency. Pertinent negatives include no chills, discharge, flank pain, hematuria, hesitancy, nausea, possible pregnancy, sweats, urgency or vomiting. The treatment provided no relief. There is no history of recurrent UTIs.   She complains of burning sensation  while urinating and suprapubic/LLQ pain for six days. Back pain is not radiated, no associated saddle anesthesia or bowel/bladder dysfunction.  She endorses associated urinary frequency but small amount of urine at the time. Right-sided back pain that is slightly different than her usual.  S/P hysterectomy.  No changes in bowel habits, blood in stool,or melena. Abdominal pain is intermittent, not radiated. She has not identified exacerbating or alleviating factors.  She took some OTC Azo for her symptoms.  Pertinent negatives include gross hematuria, urine incontinence, vaginal bleeding or discharge.  Polyarthralgia with positive ANA 02/2023, she has an appointment with rheumatology on 11/25.   Review of Systems  Constitutional:  Negative for chills.  HENT:  Negative for mouth sores and sore throat.   Gastrointestinal:  Negative for nausea and vomiting.  Genitourinary:  Positive for dysuria and frequency. Negative for flank pain, hematuria,  hesitancy and urgency.  Musculoskeletal:  Negative for gait problem.  Skin:  Negative for rash.  Neurological:  Negative for syncope and weakness.  See other pertinent positives and negatives in HPI.  Current Outpatient Medications on File Prior to Visit  Medication Sig Dispense Refill   meloxicam (MOBIC) 7.5 MG tablet Take 1-2 tablets (7.5-15 mg total) by mouth daily as needed for pain. 30 tablet 2   Multiple Vitamins-Minerals (MULTIVITAMIN WITH MINERALS) tablet Take 1 tablet by mouth daily.     No current facility-administered medications on file prior to visit.   Past Medical History:  Diagnosis Date   Cervical adenocarcinoma (HCC)    History of abnormal cervical Pap smear    w/ cryoablation in 2000   Varicose veins    No Active Allergies  Social History   Socioeconomic History   Marital status: Married    Spouse name: Not on file   Number of children: Not on file   Years of education: Not on file   Highest education level: Not on file  Occupational History   Not on file  Tobacco Use   Smoking status: Never   Smokeless tobacco: Never  Vaping Use   Vaping status: Never Used  Substance and Sexual Activity   Alcohol use: No   Drug use: No   Sexual activity: Yes    Partners: Male    Birth control/protection: Surgical    Comment: 1st intercourse- 12, partner- 1, hysterectomy  Other Topics Concern   Not on file  Social History Narrative   Not on file   Social Determinants of Health   Financial Resource Strain: Not on file  Food Insecurity: Not on file  Transportation Needs: Not on file  Physical Activity: Not on file  Stress: Not on file  Social Connections: Not on file   Vitals:   07/23/23 0858  BP: 120/70  Pulse: 85  Resp: 12  Temp: 98.2 F (36.8 C)  SpO2: 96%   Body mass index is 30.51 kg/m.  Physical Exam Vitals and nursing note reviewed.  Constitutional:      General: She is not in acute distress.    Appearance: She is well-developed.   HENT:     Head: Normocephalic and atraumatic.  Eyes:     Conjunctiva/sclera: Conjunctivae normal.  Cardiovascular:     Rate and Rhythm: Normal rate and regular rhythm.  Pulmonary:     Effort: Pulmonary effort is normal. No respiratory distress.     Breath sounds: Normal breath sounds.  Abdominal:     Palpations: Abdomen is soft. There is no mass.     Tenderness: There is abdominal tenderness in the suprapubic area and left lower quadrant. There is no CVA tenderness, right CVA tenderness, left CVA tenderness, guarding or rebound.  Musculoskeletal:        General: No edema.     Thoracic back: Tenderness present.     Lumbar back: Tenderness present. Negative right straight leg raise test and negative left straight leg raise test.       Back:  Skin:    General: Skin is warm.     Findings: No erythema.  Neurological:     General: No focal deficit present.     Mental Status: She is alert and oriented to person, place, and time.  Psychiatric:        Mood and Affect: Mood and affect, mood and affect normal.    ASSESSMENT AND PLAN:  Ms. Schaben was seen today for abdominal pain and dysuria.   Lower abdominal pain Suprapubic and LLQ. We discussed possible etiologies, suprapubic abdominal pain could be related to cystitis.  Colonoscopy 10/2020 normal except for internal and external hemorrhoids. I do not think imaging or blood work are necessary today. Instructed about warning signs.  Dysuria Urine dipstick + Blood and moderate amount of leukocytes. Will follow Ucx.  -     POCT Urinalysis Dipstick (Automated) -     Urine Culture; Future  Urinary tract infection without hematuria, site unspecified Empiric treatment with Bactrim DS bid for 5 days. Treatment will be tailor, id needed and according to Ucx  and sensitivity result. Increase fluid intake. Instructed about warning signs.  -     Sulfamethoxazole-Trimethoprim; Take 1 tablet by mouth 2 (two) times daily for 5 days.   Dispense: 10 tablet; Refill: 0  Right-sided thoracic back pain, unspecified chronicity Most likely musculoskeletal. Recommend OTC IcyHot or Aspercreme. Monitor for new symptoms.  Return if symptoms worsen or fail to improve, for keep next appointment.  Krista Cross G. [place], MD  [facility]. [place] office.

## 2023-07-25 LAB — URINE CULTURE
MICRO NUMBER:: 15757014
SPECIMEN QUALITY:: ADEQUATE

## 2023-07-28 ENCOUNTER — Ambulatory Visit: Payer: Managed Care, Other (non HMO) | Attending: Internal Medicine | Admitting: Internal Medicine

## 2023-07-28 ENCOUNTER — Encounter: Payer: Self-pay | Admitting: Internal Medicine

## 2023-07-28 VITALS — BP 98/66 | HR 73 | Resp 14 | Ht 59.5 in | Wt 142.0 lb

## 2023-07-28 DIAGNOSIS — R768 Other specified abnormal immunological findings in serum: Secondary | ICD-10-CM | POA: Insufficient documentation

## 2023-07-28 DIAGNOSIS — M2550 Pain in unspecified joint: Secondary | ICD-10-CM | POA: Diagnosis not present

## 2023-07-28 NOTE — Patient Instructions (Signed)

## 2023-07-28 NOTE — Progress Notes (Signed)
Office Visit Note  Patient: Krista Cross             Date of Birth: 1970/12/13           MRN: [id number]             PCP: [place], Betty G, MD Referring: [place], Betty G, MD Visit Date: 07/28/2023  Subjective:  New Patient (Initial Visit) (Patient states she has pain in her left arm and left ankle. )   Discussed the use of AI scribe software for clinical note transcription with the patient, who gave verbal consent to proceed.  History of Present Illness   Krista Cross is a 52 y.o. female here for evaluation of positive ANA associated with joint pain in multiple areas. She has increased joint pain on her left side for the past 6 months. They report experiencing pain in the left arm, likening it to the sensation of a blow or hit. The pain intensifies to a sharp, pulling sensation. The patient also experiences discomfort when bending the left elbow. 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 The patient also reports pain in the left ankle for the same time. The pain is described as sharp at night and achy during the day. Taking tylenol relieves some of the pain or sometimes just waiting. She does not take any prescription medicine for this.  She also reports persistent pain in the right hip, which has been present for approximately three years. Despite previous CT scans, no abnormalities have been identified in the hip.  The patient denies any major joint injuries, visible swelling in the elbow or foot, or any ulcers or sores. They have not taken any medication for the pain nor have they undergone any procedures such as injections or physical therapy. The patient has a family history of autoimmune problems, with a sister diagnosed with lupus who has joint pain all over.  Labs reviewed 02/2023 ANA 1:80 homogenous RF neg CCP neg CRP <1  Imaging  reviewed 10/30/2022 Xray Left wrist Negative  Activities of Daily Living:  Patient reports morning stiffness for less than 1  minute.   Patient Reports nocturnal pain.  Difficulty dressing/grooming: Reports Difficulty climbing stairs: Reports Difficulty getting out of chair: Reports Difficulty using hands for taps, buttons, cutlery, and/or writing: Reports  Review of Systems  Constitutional:  Negative for fatigue.  HENT:  Negative for mouth sores and mouth dryness.   Eyes:  Negative for dryness.  Respiratory:  Negative for shortness of breath.   Cardiovascular:  Negative for chest pain and palpitations.  Gastrointestinal:  Negative for blood in stool, constipation and diarrhea.  Endocrine: Negative for increased urination.  Genitourinary:  Negative for involuntary urination.  Musculoskeletal:  Positive for joint pain, joint pain, myalgias, muscle weakness, morning stiffness and myalgias. Negative for gait problem, joint swelling and muscle tenderness.  Skin:  Positive for hair loss. Negative for color change, rash and sensitivity to sunlight.  Allergic/Immunologic: Negative for susceptible to infections.  Neurological:  Positive for dizziness and headaches.  Hematological:  Negative for swollen glands.  Psychiatric/Behavioral:  Negative for depressed mood and sleep disturbance. The patient is not nervous/anxious.     PMFS History:  Patient Active Problem List   Diagnosis Date Noted   Positive ANA (antinuclear antibody) 07/28/2023   Polyarthralgia 02/12/2023   Allergic contact dermatitis 10/30/2022   Chronic right-sided low back pain without  sciatica 10/14/2022   Cervical cancer, FIGO stage IB1 (HCC) 02/09/2018   Hypertrophy of tonsil-Right 06/06/2017   Surgical menopause 05/22/2016   Cervical cancer (HCC) 10/17/2015   Cervical cancer, FIGO stage IA2 (HCC) 08/18/2015   AGUS favor benign 07/24/2015   Vitamin D deficiency 07/06/2015   Atypical glandular cells of undetermined  significance (AGUS) on cervical Pap smear 07/06/2015   Cervical cancer screening 02/27/2015   Varicose veins of bilateral lower extremities with other complications 01/07/2014   Routine general medical examination at a health care facility 12/07/2013   Dermatophytosis of body 10/27/2009   SPRAIN&STRAIN UNSPEC SITE SHOULDER&UPPER ARM 10/27/2009   MICROSCOPIC HEMATURIA 09/19/2009    Past Medical History:  Diagnosis Date   Cervical adenocarcinoma (HCC)    History of abnormal cervical Pap smear    w/ cryoablation in 2000   Varicose veins     Family History  Problem Relation Age of Onset   Hyperlipidemia Mother    Diabetes Mother    Varicose Veins Sister    Lupus Sister    Colon cancer Neg Hx    Colon polyps Neg Hx    Esophageal cancer Neg Hx    Rectal cancer Neg Hx    Stomach cancer Neg Hx    Past Surgical History:  Procedure Laterality Date   CERVICAL CONIZATION W/BX N/A 08/31/2015   Procedure: CONIZATION CERVIX WITH BIOPSY;  Surgeon: Adolphus Birchwood, MD;  Location: [facility];  Service: Gynecology;  Laterality: N/A;   CESAREAN SECTION  07-26-2003  &  12-28-2007   Bilateral Tubal Ligation with last one   ENDOVENOUS ABLATION SAPHENOUS VEIN W/ LASER Right 05-05-2014   EVLA RIGHT GREATER SAPHENOUS VEIN  BY TODD EARLY MD   ROBOTIC ASSISTED TOTAL HYSTERECTOMY WITH BILATERAL SALPINGO OOPHERECTOMY Bilateral 10/17/2015   Procedure: XI ROBOTIC ASSISTED TYPE III RADICAL TOTAL HYSTERECTOMY WITH BILATERAL SALPINGECTOMY OOPHORECTOMY WITH SENTINEL LYMPH NODE BIOPSY;  Surgeon: Adolphus Birchwood, MD;  Location: [facility];  Service: Gynecology;  Laterality: Bilateral;   Social History   Social History Narrative   Not on file   Immunization History  Administered Date(s) Administered   Influenza Whole 08/15/2008, 09/19/2009   Influenza,inj,Quad PF,6+ Mos 07/06/2015, 11/01/2016, 06/06/2017, 08/03/2021, 05/13/2022   Moderna Sars-Covid-2 Vaccination 01/12/2020   PFIZER(Purple Top)SARS-COV-2  Vaccination 12/21/2019, 01/11/2020   Td 09/02/2002   Tdap 12/07/2013   Zoster Recombinant(Shingrix) 05/13/2022, 02/12/2023     Objective: Vital Signs: BP 98/66 (BP Location: Right Arm, Patient Position: Sitting, Cuff Size: Normal)   Pulse 73   Resp 14   Ht 4' 11.5" (1.511 m)   Wt 142 lb (64.4 kg)   LMP 09/25/2015   BMI 28.20 kg/m    Physical Exam Eyes:     Conjunctiva/sclera: Conjunctivae normal.  Cardiovascular:     Rate and Rhythm: Normal rate and regular rhythm.  Pulmonary:     Effort: Pulmonary effort is normal.     Breath sounds: Normal breath sounds.  Lymphadenopathy:     Cervical: No cervical adenopathy.  Skin:    General: Skin is warm and dry.  Neurological:     Mental Status: She is alert.  Psychiatric:        Mood and Affect: Mood normal.     Musculoskeletal Exam:  Shoulders full ROM, no swelling Tenderness to pressure at the left side traps, about halfway along humerus, no focla elbow tenderness but pain increased with resisted supination Wrists full ROM no tenderness or swelling Fingers full ROM no tenderness or swelling Knees  full ROM no tenderness or swelling, mild patellofemoral crepitus Ankles full ROM, left ankle pain with forceful dorsiflexion or eversion   Investigation: No additional findings.  Imaging: No results found.  Recent Labs: Lab Results  Component Value Date   WBC 5.2 08/08/2022   HGB 13.5 08/08/2022   PLT 201 08/08/2022   NA 142 08/08/2022   K 4.2 08/08/2022   CL 105 08/08/2022   CO2 29 08/08/2022   GLUCOSE 93 08/08/2022   BUN 14 08/08/2022   CREATININE 0.69 08/08/2022   BILITOT 0.4 08/08/2022   ALKPHOS 115 11/05/2016   AST 18 08/08/2022   ALT 16 08/08/2022   PROT 7.7 08/08/2022   ALBUMIN 4.0 11/05/2016   CALCIUM 9.2 08/08/2022   GFRAA >60 10/18/2015    Speciality Comments: No specialty comments available.  Procedures:  No procedures performed Allergies: Patient has no active allergies.   Assessment / Plan:      Visit Diagnoses: Positive ANA (antinuclear antibody) - Plan: RNP Antibody, Anti-Smith antibody, Sjogrens syndrome-B extractable nuclear antibody, Sjogrens syndrome-A extractable nuclear antibody, Anti-DNA antibody, double-stranded, C3 and C4  Joint Pain Predominantly left-sided joint pain for approximately six months, with the most discomfort in the left elbow and right ankle. Pain described as sharp at night and achy with use during the day. Mild osteoarthritis suspected due to popping and clicking in joints. Family history of lupus, but no clinical signs suggestive of lupus observed. -Order ENA panel as detailed above to further investigate inflammation and rule out autoimmune conditions. If positive needs f/u to discuss specific medications. -If results are all negative, can refer to a sports medicine orthopedic doctor for further evaluation and management. -Provide information on over-the-counter medications and supplements for joint pain relief.  Right Hip Pain Chronic pain for several years, with previous CT scans showing no abnormalities. Pain does not appear to be related to intraarticular pathology with good ROM preserved.   Orders: Orders Placed This Encounter  Procedures   RNP Antibody   Anti-Smith antibody   Sjogrens syndrome-B extractable nuclear antibody   Sjogrens syndrome-A extractable nuclear antibody   Anti-DNA antibody, double-stranded   C3 and C4   No orders of the defined types were placed in this encounter.   Follow-Up Instructions: No follow-ups on file.   Fuller Plan, MD  Note - This record has been created using [employer].  Chart creation errors have been sought, but may not always  have been located. Such creation errors do not reflect on  the standard of medical care.

## 2023-07-29 LAB — RNP ANTIBODY: Ribonucleic Protein(ENA) Antibody, IgG: <1 AI

## 2023-07-29 LAB — ANTI-DNA ANTIBODY, DOUBLE-STRANDED: ds DNA Ab: 1 [IU]/mL

## 2023-07-29 LAB — C3 AND C4
C3 Complement: 150 mg/dL (ref 83–193)
C4 Complement: 21 mg/dL (ref 15–57)

## 2023-07-29 LAB — SJOGRENS SYNDROME-A EXTRACTABLE NUCLEAR ANTIBODY: SSA (Ro) (ENA) Antibody, IgG: <1 AI

## 2023-07-29 LAB — SJOGRENS SYNDROME-B EXTRACTABLE NUCLEAR ANTIBODY: SSB (La) (ENA) Antibody, IgG: <1 AI

## 2023-07-29 LAB — ANTI-SMITH ANTIBODY: ENA SM Ab Ser-aCnc: <1 AI

## 2023-08-12 ENCOUNTER — Ambulatory Visit: Payer: Commercial Managed Care - HMO | Admitting: Obstetrics and Gynecology

## 2023-08-13 ENCOUNTER — Ambulatory Visit (INDEPENDENT_AMBULATORY_CARE_PROVIDER_SITE_OTHER): Payer: Commercial Managed Care - HMO | Admitting: Obstetrics and Gynecology

## 2023-08-13 ENCOUNTER — Encounter: Payer: Self-pay | Admitting: Obstetrics and Gynecology

## 2023-08-13 ENCOUNTER — Other Ambulatory Visit (HOSPITAL_COMMUNITY)
Admission: RE | Admit: 2023-08-13 | Discharge: 2023-08-13 | Disposition: A | Discharge status: Home / Self Care | Payer: Commercial Managed Care - HMO | Source: Ambulatory Visit | Attending: Obstetrics and Gynecology | Admitting: Obstetrics and Gynecology

## 2023-08-13 VITALS — BP 112/80 | HR 84 | Ht 58.25 in | Wt 141.0 lb

## 2023-08-13 DIAGNOSIS — D229 Melanocytic nevi, unspecified: Secondary | ICD-10-CM | POA: Diagnosis not present

## 2023-08-13 DIAGNOSIS — Z01419 Encounter for gynecological examination (general) (routine) without abnormal findings: Secondary | ICD-10-CM

## 2023-08-13 DIAGNOSIS — Z23 Encounter for immunization: Secondary | ICD-10-CM | POA: Diagnosis not present

## 2023-08-13 DIAGNOSIS — E2839 Other primary ovarian failure: Secondary | ICD-10-CM

## 2023-08-13 NOTE — Progress Notes (Signed)
52 y.o. y.o. female here for annual exam. Patient's last menstrual period was 09/25/2015.    [id number] Married.  Has 3 grand-children.   RP:  Established patient presenting for annual gyn exam    HPI: AdenoCa of Cervix FIGO 1A2.  S/P Robotic type III Radical laparoscopic Hysterectomy with BSO and Bilateral Pelvic Lymphadenectomy on 10/17/2015. CT scan of Abdomen and Pelvis Neg 09/2018.  Discharged from Dr Andrey Farmer at 5 yrs.  All Paps Neg since surgery.  Last Pap Neg 08/2021.  Pap reflex today.  No pain with IC with coconut oil.  Urine/BMs wnl.  Breasts normal. Mammo at [facility] in 2023, will obtain fax.  BMI 29.13.  Walking regularly. Fasting health labs here today.  Will schedule first screening Colonoscopy 2024 hemorrhoids. Repeat in 10 years. There is no height or weight on file to calculate BMI.     02/12/2023    7:34 AM 10/14/2022    3:31 PM 05/13/2022    2:01 PM  Depression screen PHQ 2/9  Decreased Interest 0 0 0  Down, Depressed, Hopeless 0 0 0  PHQ - 2 Score 0 0 0  Altered sleeping   0  Tired, decreased energy   2  Change in appetite   0  Feeling bad or failure about yourself    0  Trouble concentrating   0  Moving slowly or fidgety/restless   0  Suicidal thoughts   0  PHQ-9 Score   2  Difficult doing work/chores   Not difficult at all    Last menstrual period 09/25/2015.     Component Value Date/Time   DIAGPAP  08/08/2022 5409    - Negative for intraepithelial lesion or malignancy (NILM)   DIAGPAP  08/03/2021 1000    - Negative for intraepithelial lesion or malignancy (NILM)   DIAGPAP  02/15/2019 0000    NEGATIVE FOR INTRAEPITHELIAL LESIONS OR MALIGNANCY.   HPVHIGH Negative 08/08/2022 0832   ADEQPAP Satisfactory for evaluation. 08/08/2022 0832   ADEQPAP Satisfactory for evaluation. 08/03/2021 1000   ADEQPAP Satisfactory for evaluation. 02/15/2019 0000    GYN HISTORY:    Component Value Date/Time   DIAGPAP  08/08/2022 8119    - Negative for intraepithelial lesion  or malignancy (NILM)   DIAGPAP  08/03/2021 1000    - Negative for intraepithelial lesion or malignancy (NILM)   DIAGPAP  02/15/2019 0000    NEGATIVE FOR INTRAEPITHELIAL LESIONS OR MALIGNANCY.   HPVHIGH Negative 08/08/2022 0832   ADEQPAP Satisfactory for evaluation. 08/08/2022 0832   ADEQPAP Satisfactory for evaluation. 08/03/2021 1000   ADEQPAP Satisfactory for evaluation. 02/15/2019 0000    OB History  Gravida Para Term Preterm AB Living  5 4 0   1 4  SAB IAB Ectopic Multiple Live Births  1            # Outcome Date GA Lbr Len/2nd Weight Sex Type Anes PTL Lv  5 SAB           4 Para      CS-LTranv     3 Para      CS-LTranv     2 Para      Vag-Spont     1 Para      Vag-Spont       Past Medical History:  Diagnosis Date   Cervical adenocarcinoma (HCC)    History of abnormal cervical Pap smear    w/ cryoablation in 2000   Varicose veins     Past Surgical History:  Procedure Laterality Date   CERVICAL CONIZATION W/BX N/A 08/31/2015   Procedure: CONIZATION CERVIX WITH BIOPSY;  Surgeon: Adolphus Birchwood, MD;  Location: [facility];  Service: Gynecology;  Laterality: N/A;   CESAREAN SECTION  07-26-2003  &  12-28-2007   Bilateral Tubal Ligation with last one   ENDOVENOUS ABLATION SAPHENOUS VEIN W/ LASER Right 05-05-2014   EVLA RIGHT GREATER SAPHENOUS VEIN  BY TODD EARLY MD   ROBOTIC ASSISTED TOTAL HYSTERECTOMY WITH BILATERAL SALPINGO OOPHERECTOMY Bilateral 10/17/2015   Procedure: XI ROBOTIC ASSISTED TYPE III RADICAL TOTAL HYSTERECTOMY WITH BILATERAL SALPINGECTOMY OOPHORECTOMY WITH SENTINEL LYMPH NODE BIOPSY;  Surgeon: Adolphus Birchwood, MD;  Location: [facility];  Service: Gynecology;  Laterality: Bilateral;    Current Outpatient Medications on File Prior to Visit  Medication Sig Dispense Refill   meloxicam (MOBIC) 7.5 MG tablet Take 1-2 tablets (7.5-15 mg total) by mouth daily as needed for pain. (Patient not taking: Reported on 07/28/2023) 30 tablet 2   Multiple  Vitamins-Minerals (MULTIVITAMIN WITH MINERALS) tablet Take 1 tablet by mouth daily.     VITAMIN D PO Take by mouth.     No current facility-administered medications on file prior to visit.    Social History   Socioeconomic History   Marital status: Married    Spouse name: Not on file   Number of children: Not on file   Years of education: Not on file   Highest education level: Not on file  Occupational History   Not on file  Tobacco Use   Smoking status: Never    Passive exposure: Never   Smokeless tobacco: Never  Vaping Use   Vaping status: Never Used  Substance and Sexual Activity   Alcohol use: No   Drug use: No   Sexual activity: Yes    Partners: Male    Birth control/protection: Surgical    Comment: 1st intercourse- 52, partner- 1, hysterectomy  Other Topics Concern   Not on file  Social History Narrative   Not on file   Social Determinants of Health   Financial Resource Strain: Not on file  Food Insecurity: Not on file  Transportation Needs: Not on file  Physical Activity: Not on file  Stress: Not on file  Social Connections: Not on file  Intimate Partner Violence: Not on file    Family History  Problem Relation Age of Onset   Hyperlipidemia Mother    Diabetes Mother    Varicose Veins Sister    Lupus Sister    Colon cancer Neg Hx    Colon polyps Neg Hx    Esophageal cancer Neg Hx    Rectal cancer Neg Hx    Stomach cancer Neg Hx      No Active Allergies    Patient's last menstrual period was Patient's last menstrual period was 09/25/2015.Marland Kitchen             Review of Systems Alls systems reviewed and are negative.  complains of arm and joint pain. Seen with provider   Physical Exam Constitutional:      Appearance: Normal appearance.  Genitourinary:     Vulva normal.     No lesions in the vagina.     Right Labia: No rash, lesions or skin changes.    Left Labia: No lesions, skin changes or rash.    Vaginal cuff intact.    No vaginal  discharge or tenderness.     No vaginal prolapse present.    Moderate vaginal atrophy present.  Right Adnexa: absent.    Left Adnexa: absent.    Cervix is absent.     Uterus is absent.  Breasts:    Right: Normal.     Left: Normal.  HENT:     Head: Normocephalic.  Neck:     Thyroid: No thyroid mass, thyromegaly or thyroid tenderness.  Cardiovascular:     Rate and Rhythm: Normal rate and regular rhythm.     Heart sounds: Normal heart sounds, S1 normal and S2 normal.  Pulmonary:     Effort: Pulmonary effort is normal.     Breath sounds: Normal breath sounds and air entry.  Abdominal:     General: Bowel sounds are normal. There is no distension.     Palpations: Abdomen is soft. There is no mass.     Tenderness: There is no abdominal tenderness. There is no guarding or rebound.  Musculoskeletal:     Cervical back: Full passive range of motion without pain, normal range of motion and neck supple. No tenderness.     Right lower leg: No edema.     Left lower leg: No edema.  Neurological:     Mental Status: She is alert.  Skin:    General: Skin is warm.  Psychiatric:        Mood and Affect: Mood normal.        Behavior: Behavior normal.        Thought Content: Thought content normal.  Vitals and nursing note reviewed. Exam conducted with a chaperone present.       A:         Well Woman GYN exam                             P:        Pap smear collected today Encouraged annual mammogram screening Colon cancer screening up-to-date DXA ordered today Labs and immunizations ordered today Discussed breast self exams Encouraged healthy lifestyle practices Encouraged Vit D and Calcium   No follow-ups on file.  Earley Favor

## 2023-08-14 LAB — CBC
HCT: 40.2 % (ref 35.0–45.0)
Hemoglobin: 13.1 g/dL (ref 11.7–15.5)
MCH: 28.5 pg (ref 27.0–33.0)
MCHC: 32.6 g/dL (ref 32.0–36.0)
MCV: 87.4 fL (ref 80.0–100.0)
MPV: 10.6 fL (ref 7.5–12.5)
Platelets: 216 10*3/uL (ref 140–400)
RBC: 4.6 10*6/uL (ref 3.80–5.10)
RDW: 12.5 % (ref 11.0–15.0)
WBC: 6 10*3/uL (ref 3.8–10.8)

## 2023-08-14 LAB — COMPREHENSIVE METABOLIC PANEL
AG Ratio: 1.3 (calc) (ref 1.0–2.5)
ALT: 15 U/L (ref 6–29)
AST: 20 U/L (ref 10–35)
Albumin: 4.3 g/dL (ref 3.6–5.1)
Alkaline phosphatase (APISO): 113 U/L (ref 37–153)
BUN: 18 mg/dL (ref 7–25)
CO2: 29 mmol/L (ref 20–32)
Calcium: 9.4 mg/dL (ref 8.6–10.4)
Chloride: 105 mmol/L (ref 98–110)
Creat: 0.74 mg/dL (ref 0.50–1.03)
Globulin: 3.4 g/dL (ref 1.9–3.7)
Glucose, Bld: 89 mg/dL (ref 65–99)
Potassium: 4.4 mmol/L (ref 3.5–5.3)
Sodium: 142 mmol/L (ref 135–146)
Total Bilirubin: 0.6 mg/dL (ref 0.2–1.2)
Total Protein: 7.7 g/dL (ref 6.1–8.1)

## 2023-08-14 LAB — VITAMIN D 25 HYDROXY (VIT D DEFICIENCY, FRACTURES): Vit D, 25-Hydroxy: 51 ng/mL (ref 30–100)

## 2023-08-14 LAB — LIPID PANEL
Cholesterol: 182 mg/dL (ref <200)
HDL: 61 mg/dL (ref >=50)
LDL Cholesterol (Calc): 104 mg/dL — ABNORMAL HIGH
Non-HDL Cholesterol (Calc): 121 mg/dL (ref <130)
Total CHOL/HDL Ratio: 3 (calc) (ref <5.0)
Triglycerides: 84 mg/dL (ref <150)

## 2023-08-14 LAB — CYTOLOGY - PAP
Comment: NEGATIVE
Diagnosis: NEGATIVE
High risk HPV: NEGATIVE

## 2023-08-14 LAB — ESTRADIOL: Estradiol: <15 pg/mL

## 2023-08-14 LAB — HEMOGLOBIN A1C
Hgb A1c MFr Bld: 5.8 %{Hb} — ABNORMAL HIGH (ref <5.7)
Mean Plasma Glucose: 120 mg/dL
eAG (mmol/L): 6.6 mmol/L

## 2023-08-14 LAB — TSH: TSH: 1.26 m[IU]/L

## 2023-08-14 LAB — FOLLICLE STIMULATING HORMONE: FSH: 44.5 m[IU]/mL

## 2023-08-19 ENCOUNTER — Other Ambulatory Visit: Payer: Self-pay

## 2023-08-19 DIAGNOSIS — R7303 Prediabetes: Secondary | ICD-10-CM

## 2023-09-02 ENCOUNTER — Encounter: Payer: Self-pay | Admitting: Radiology

## 2023-09-02 ENCOUNTER — Ambulatory Visit (INDEPENDENT_AMBULATORY_CARE_PROVIDER_SITE_OTHER): Payer: Commercial Managed Care - HMO | Admitting: Radiology

## 2023-09-02 VITALS — BP 98/62 | HR 91 | Wt 144.0 lb

## 2023-09-02 DIAGNOSIS — N951 Menopausal and female climacteric states: Secondary | ICD-10-CM | POA: Diagnosis not present

## 2023-09-02 MED ORDER — ESTRADIOL 0.05 MG/24HR TD PTTW: 1.0000 | MEDICATED_PATCH | TRANSDERMAL | 2 refills | Status: DC

## 2023-09-02 NOTE — Progress Notes (Signed)
   Krista Cross 06/13/1971 [id number]   History: Postmenopausal 52 y.o. presents for consult regarding starting HRT. Experiences hot flashes and night sweats, joint pain and fatigue. Total hysterectomy/BSO.    Gynecologic History Postmenopausal Last Pap: 12/24. Results were: normal Last mammogram: 2/24. Results were: normal   Obstetric History OB History  Gravida Para Term Preterm AB Living  5 4 0  1 4  SAB IAB Ectopic Multiple Live Births  1        # Outcome Date GA Lbr Len/2nd Weight Sex Type Anes PTL Lv  5 SAB           4 Para      CS-LTranv     3 Para      CS-LTranv     2 Para      Vag-Spont     1 Para      Vag-Spont        The following portions of the patient's history were reviewed and updated as appropriate: allergies, current medications, past family history, past medical history, past social history, past surgical history, and problem list.  Review of Systems Pertinent items noted in HPI and remainder of comprehensive ROS otherwise negative.  Past medical history, past surgical history, family history and social history were all reviewed and documented in the EPIC chart.  Exam:  Vitals:   09/02/23 1045  BP: 98/62  Pulse: 91  SpO2: 96%  Weight: 144 lb (65.3 kg)   Body mass index is 29.84 kg/m.  Physical Exam Vitals and nursing note reviewed.  Constitutional:      Appearance: Normal appearance. She is normal weight.  Cardiovascular:     Heart sounds: Normal heart sounds.  Pulmonary:     Effort: Pulmonary effort is normal.  Abdominal:     General: Abdomen is flat. Bowel sounds are normal.     Palpations: Abdomen is soft.  Neurological:     Mental Status: She is alert.  Psychiatric:        Mood and Affect: Mood normal.        Thought Content: Thought content normal.        Judgment: Judgment normal.      Assessment/Plan:   1. Menopausal syndrome (Primary) Risks and benefits reviewed  - estradiol  (VIVELLE -DOT) 0.05 MG/24HR patch; Place  1 patch (0.05 mg total) onto the skin 2 (two) times a week.  Dispense: 8 patch; Refill: 2  Follow up in 3 months  Tyhir Schwan B WHNP-BC, 10:59 AM 09/02/2023

## 2023-12-02 ENCOUNTER — Ambulatory Visit: Payer: Commercial Managed Care - HMO | Admitting: Radiology

## 2023-12-02 ENCOUNTER — Encounter: Payer: Self-pay | Admitting: Radiology

## 2023-12-02 DIAGNOSIS — N951 Menopausal and female climacteric states: Secondary | ICD-10-CM | POA: Diagnosis not present

## 2023-12-02 MED ORDER — ESTRADIOL 0.05 MG/24HR TD PTTW: 1.0000 | MEDICATED_PATCH | TRANSDERMAL | 3 refills | Status: DC

## 2023-12-02 NOTE — Progress Notes (Signed)
   Krista Cross March 08, 1971 [id number]   History: Postmenopausal 53 y.o. presents for follow up after starting ERT for hot flashes and night sweats. Accompanied by Spanish interpreter. Doing much better, also sleeping better and has more energy. Would like to continue current dosing.   Gynecologic History Postmenopausal Last Pap: 12/204 Results were: normal Last mammogram: 10/2022. Results were: normal   Obstetric History OB History  Gravida Para Term Preterm AB Living  5 4 0  1 4  SAB IAB Ectopic Multiple Live Births  1        # Outcome Date GA Lbr Len/2nd Weight Sex Type Anes PTL Lv  5 SAB           4 Para      CS-LTranv     3 Para      CS-LTranv     2 Para      Vag-Spont     1 Para      Vag-Spont          08/13/2023   11:06 AM 02/12/2023    7:34 AM 10/14/2022    3:31 PM  Depression screen PHQ 2/9  Decreased Interest 0 0 0  Down, Depressed, Hopeless 0 0 0  PHQ - 2 Score 0 0 0     The following portions of the patient's history were reviewed and updated as appropriate: allergies, current medications, past family history, past medical history, past social history, past surgical history, and problem list.  Review of Systems Pertinent items noted in HPI and remainder of comprehensive ROS otherwise negative.  Past medical history, past surgical history, family history and social history were all reviewed and documented in the EPIC chart.  Exam:  Vitals:   12/02/23 1100  BP: 102/64  Pulse: 80  SpO2: 96%  Weight: 140 lb 9.6 oz (63.8 kg)   Body mass index is 29.13 kg/m.  Physical Exam Vitals and nursing note reviewed.  Constitutional:      Appearance: Normal appearance.  Pulmonary:     Effort: Pulmonary effort is normal.  Neurological:     Mental Status: She is alert.  Psychiatric:        Mood and Affect: Mood normal.        Thought Content: Thought content normal.        Judgment: Judgment normal.      Assessment/Plan:   1. Menopausal  syndrome Risks and benefits reviewed Follow up for AEX 08/2024 - estradiol (VIVELLE-DOT) 0.05 MG/24HR patch; Place 1 patch (0.05 mg total) onto the skin 2 (two) times a week.  Dispense: 24 patch; Refill: 3    Kristjan Derner B WHNP-BC, 11:09 AM 12/02/2023

## 2023-12-30 ENCOUNTER — Ambulatory Visit: Payer: Self-pay | Admitting: *Deleted

## 2023-12-30 VITALS — BP 90/56 | Wt 140.0 lb

## 2023-12-30 DIAGNOSIS — Z1239 Encounter for other screening for malignant neoplasm of breast: Secondary | ICD-10-CM

## 2023-12-30 NOTE — Progress Notes (Signed)
 Ms. Krista Cross is a 53 y.o. female who presents to [facility] clinic today with no complaints.    Pap Smear: Pap smear not completed today. Last Pap smear was 08/13/2023 at [facility] clinic and was normal with negative HPV. 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 Per patient all other Pap smears before and after have been normal. Last Pap smear result is available in Epic.   Physical exam: Breasts Breasts symmetrical. No skin abnormalities bilateral breasts. No nipple retraction right breast. Left nipple slightly inverted that per patient is normal for her. No nipple discharge bilateral breasts. No lymphadenopathy. No lumps palpated bilateral breasts. No complaints of pain or tenderness on exam.      Pelvic/Bimanual Pap is not indicated today per BCCCP guidelines.   Smoking History: Patient has never smoked.   Patient Navigation: Patient education provided. Access to services provided for patient through [facility] program. Spanish interpreter Herma Longest from [facility] provided.   Colorectal Cancer Screening: Per patient had a colonoscopy completed 1 years ago at [facility]. No complaints today.    Breast and Cervical Cancer Risk Assessment: Patient does not have family history of breast cancer, known genetic mutations, or radiation treatment to the chest before age 15. Patient has history of cervical dysplasia. Patient has no history of being immunocompromised or DES exposure in-utero.  Risk Scores as of Encounter on 12/30/2023     Gregary Lean           5-year 0.74%   Lifetime 6.26%            Last calculated by Silas, Ansyi K, CMA on 12/30/2023 at 10:26 AM        A: BCCCP exam without pap smear No complaints.  P: Referred patient to [facility] for a screening mammogram. Appointment scheduled Tuesday, December 30, 2023 at 1130.  Stefan Edge, RN 12/30/2023 10:38 AM

## 2023-12-30 NOTE — Patient Instructions (Addendum)
 Explained breast self awareness with Krista Cross. Patient did not need a Pap smear today due to last Pap smear and HPV typing was 08/13/2023. Let her know based on her history of cervical cancer that her next Pap smear is due in December 2025. Referred patient to [facility] for a screening mammogram. Appointment scheduled Tuesday, December 30, 2023 at 1130. Patient aware of appointment and will be there. Krista Cross verbalized understanding.  Krista Cross, Krista Favor, RN 10:38 AM

## 2024-01-14 ENCOUNTER — Ambulatory Visit: Admitting: Internal Medicine

## 2024-01-14 ENCOUNTER — Encounter: Payer: Self-pay | Admitting: Internal Medicine

## 2024-01-14 VITALS — BP 110/60 | HR 101 | Temp 99.1°F | Wt 138.7 lb

## 2024-01-14 DIAGNOSIS — J069 Acute upper respiratory infection, unspecified: Secondary | ICD-10-CM | POA: Diagnosis not present

## 2024-01-14 DIAGNOSIS — H9202 Otalgia, left ear: Secondary | ICD-10-CM

## 2024-01-14 DIAGNOSIS — R059 Cough, unspecified: Secondary | ICD-10-CM

## 2024-01-14 DIAGNOSIS — J029 Acute pharyngitis, unspecified: Secondary | ICD-10-CM

## 2024-01-14 LAB — POCT INFLUENZA A/B
Influenza A, POC: NEGATIVE
Influenza B, POC: NEGATIVE

## 2024-01-14 LAB — POC COVID19 BINAXNOW: SARS Coronavirus 2 Ag: NEGATIVE

## 2024-01-14 LAB — POCT RAPID STREP A (OFFICE): Rapid Strep A Screen: NEGATIVE

## 2024-01-14 NOTE — Progress Notes (Signed)
 Established Patient Office Visit     CC/Reason for Visit: URI symptoms  HPI: Krista Cross is a 53 y.o. female who is coming in today for the above mentioned reasons. Past Medical History is significant for: For the past 3 days has been experiencing sore throat, postnasal drip, dry cough as well as nasal, sinus, ear congestion.   Past Medical/Surgical History: Past Medical History:  Diagnosis Date   Cervical adenocarcinoma (HCC)    History of abnormal cervical Pap smear    w/ cryoablation in 2000   Varicose veins     Past Surgical History:  Procedure Laterality Date   CERVICAL CONIZATION W/BX N/A 08/31/2015   Procedure: CONIZATION CERVIX WITH BIOPSY;  Surgeon: Alphonso Aschoff, MD;  Location: [facility];  Service: Gynecology;  Laterality: N/A;   CESAREAN SECTION  07-26-2003  &  12-28-2007   Bilateral Tubal Ligation with last one   ENDOVENOUS ABLATION SAPHENOUS VEIN W/ LASER Right 05-05-2014   EVLA RIGHT GREATER SAPHENOUS VEIN  BY TODD EARLY MD   ROBOTIC ASSISTED TOTAL HYSTERECTOMY WITH BILATERAL SALPINGO OOPHERECTOMY Bilateral 10/17/2015   Procedure: XI ROBOTIC ASSISTED TYPE III RADICAL TOTAL HYSTERECTOMY WITH BILATERAL SALPINGECTOMY OOPHORECTOMY WITH SENTINEL LYMPH NODE BIOPSY;  Surgeon: Alphonso Aschoff, MD;  Location: [facility];  Service: Gynecology;  Laterality: Bilateral;    Social History:  reports that she has never smoked. She has never been exposed to tobacco smoke. She has never used smokeless tobacco. She reports that she does not drink alcohol and does not use drugs.  Allergies: No Known Allergies  Family History:  Family History  Problem Relation Age of Onset   Hyperlipidemia Mother    Diabetes Mother    Varicose Veins Sister    Lupus Sister    Colon cancer Neg Hx    Colon polyps Neg Hx    Esophageal cancer Neg Hx    Rectal cancer Neg Hx    Stomach cancer Neg Hx      Current Outpatient Medications:    estradiol  (VIVELLE -DOT) 0.05  MG/24HR patch, Place 1 patch (0.05 mg total) onto the skin 2 (two) times a week., Disp: 24 patch, Rfl: 3   Multiple Vitamins-Minerals (MULTIVITAMIN WITH MINERALS) tablet, Take 1 tablet by mouth daily., Disp: , Rfl:    VITAMIN D  PO, Take by mouth., Disp: , Rfl:   Review of Systems:  Negative unless indicated in HPI.   Physical Exam: Vitals:   01/14/24 1329  BP: 110/60  Pulse: (!) 101  Temp: 99.1 F (37.3 C)  TempSrc: Oral  SpO2: 96%  Weight: 138 lb 11.2 oz (62.9 kg)    Body mass index is 28.74 kg/m.   Physical Exam   Impression and Plan:  Sore throat -     POC COVID-19 BinaxNow -     POCT Influenza A/B -     POCT rapid strep A  Ear pain, left -     POC COVID-19 BinaxNow -     POCT Influenza A/B -     POCT rapid strep A  Cough, unspecified type -     POC COVID-19 BinaxNow -     POCT Influenza A/B -     POCT rapid strep A  Viral URI   - In office flu, COVID, strep tests are negative. - Given exam findings, PNA, pharyngitis, ear infection are not likely, hence abx have not been prescribed. -Have advised rest, fluids, OTC antihistamines, cough suppressants and mucinex. -RTC if no improvement in  10-14 days.    Time spent:22 minutes reviewing chart, interviewing and examining patient and formulating plan of care.     Marguerita Shih, MD  Primary Care at [facility]

## 2024-02-24 ENCOUNTER — Ambulatory Visit: Payer: Self-pay | Admitting: Dermatology

## 2024-04-29 ENCOUNTER — Ambulatory Visit: Payer: Self-pay | Admitting: Dermatology

## 2024-04-29 ENCOUNTER — Encounter: Payer: Self-pay | Admitting: Dermatology

## 2024-04-29 VITALS — BP 97/67 | HR 76

## 2024-04-29 DIAGNOSIS — L821 Other seborrheic keratosis: Secondary | ICD-10-CM

## 2024-04-29 NOTE — Patient Instructions (Signed)

## 2024-04-29 NOTE — Progress Notes (Signed)
   New Patient Visit   Subjective  Krista Cross is a 53 y.o. female who presents for the following: Spots of concern.  Patient states she has a few moles she is worried about.  One has changed on the upper thigh but the other have not. No hx or family hx of skin cancer. Spanish Interpreter present.   The following portions of the chart were reviewed this encounter and updated as appropriate: medications, allergies, medical history  Review of Systems:  No other skin or systemic complaints except as noted in HPI or Assessment and Plan.  Objective  Well appearing patient in no apparent distress; mood and affect are within normal limits.  A focused examination was performed of the following areas: Right leg and left arm  Relevant exam findings are noted in the Assessment and Plan.    Assessment & Plan   SEBORRHEIC KERATOSIS - Stuck-on, waxy, tan-brown papules and/or plaques  - Benign-appearing - Discussed benign etiology and prognosis. - Observe - Call for any changes  Return if symptoms worsen or fail to improve.  I, Berwyn Lesches, Surg Tech III, am acting as scribe for RUFUS CHRISTELLA HOLY, MD.   Documentation: I have reviewed the above documentation for accuracy and completeness, and I agree with the above.  RUFUS CHRISTELLA HOLY, MD

## 2024-06-23 ENCOUNTER — Ambulatory Visit: Admitting: Family Medicine

## 2024-06-23 ENCOUNTER — Ambulatory Visit: Payer: Self-pay

## 2024-06-23 ENCOUNTER — Encounter: Payer: Self-pay | Admitting: Family Medicine

## 2024-06-23 VITALS — BP 110/60 | HR 83 | Temp 97.9°F | Resp 16 | Ht <= 58 in | Wt 139.8 lb

## 2024-06-23 DIAGNOSIS — R519 Headache, unspecified: Secondary | ICD-10-CM

## 2024-06-23 DIAGNOSIS — R300 Dysuria: Secondary | ICD-10-CM | POA: Diagnosis not present

## 2024-06-23 DIAGNOSIS — M542 Cervicalgia: Secondary | ICD-10-CM

## 2024-06-23 DIAGNOSIS — N390 Urinary tract infection, site not specified: Secondary | ICD-10-CM | POA: Diagnosis not present

## 2024-06-23 LAB — POCT URINALYSIS DIPSTICK
Bilirubin, UA: NEGATIVE
Clarity, UA: NEGATIVE
Glucose, UA: NEGATIVE
Ketones, UA: NEGATIVE
Nitrite, UA: NEGATIVE
Protein, UA: NEGATIVE
Spec Grav, UA: 1.015 (ref 1.010–1.025)
Urobilinogen, UA: 0.2 U/dL
pH, UA: 6 (ref 5.0–8.0)

## 2024-06-23 MED ORDER — CYCLOBENZAPRINE HCL 10 MG PO TABS: 5.0000 mg | ORAL_TABLET | Freq: Every day | ORAL | 0 refills | Status: DC

## 2024-06-23 MED ORDER — NITROFURANTOIN MONOHYD MACRO 100 MG PO CAPS: 100.0000 mg | ORAL_CAPSULE | Freq: Two times a day (BID) | ORAL | 0 refills | Status: AC

## 2024-06-23 NOTE — Progress Notes (Signed)
 ACUTE VISIT Chief Complaint  Patient presents with   Dysuria   HPI: Krista Cross is a 53 y.o. female with past medical history significant for polyarthralgia and vitamin D  deficiency, who is here today complaining of 3 days of dysuria, urinary frequency, and urgency. She also has some urine incontinence, which is not new and seems to be stable.  Dysuria  This is a new problem. The current episode started in the past 7 days. The problem has been unchanged. The quality of the pain is described as burning. The pain is mild. There has been no fever. She is Sexually active. There is No history of pyelonephritis. Associated symptoms include frequency, hesitancy and urgency. Pertinent negatives include no chills, discharge, flank pain, hematuria, nausea, possible pregnancy, sweats or vomiting.   Took OTC Azo x 1. She has had some chills. Negative for associated abdominal pain, nausea, vomiting.     Component Ref Range & Units (hover) 11 mo ago  MICRO NUMBER: [id number]  SPECIMEN QUALITY: Adequate  Sample Source URINE  STATUS: FINAL  ISOLATE 1: Escherichia coli Abnormal   Comment: Greater than 100,000 CFU/mL of Escherichia coli        Susceptibility   Escherichia coli    URINE CULTURE, REFLEX    AMOX/CLAVULANIC >=32 Resistant    AMPICILLIN >=32 Resistant    AMPICILLIN/SULBACTAM >=32 Resistant    CEFAZOLIN  8 Resistant 1    CEFEPIME <=1 Sensitive    CEFTAZIDIME <=1 Sensitive    CEFTRIAXONE <=1 Sensitive    CIPROFLOXACIN <=0.25 Sensitive    GENTAMICIN <=1 Sensitive    IMIPENEM <=0.25 Sensitive    LEVOFLOXACIN <=0.12 Sensitive    NITROFURANTOIN <=16 Sensitive    PIP/TAZO <=4 Sensitive    TOBRAMYCIN <=1 Sensitive    TRIMETH /SULFA  <=20 Sensitive 2             -Also complaining of 3 days of right temporal headache that seems to be radiating from neck. Associated photophobia, no associated nausea or vomiting.  She reports intermittent episodes of blurry vision for a few  minutes.  She has not had an eye exam in over a year. Pressure-like headache 9/10 at times and can interfere with sleep. Tylenol  helps temporarily. She reports having other headaches in the past but usually if she takes Tylenol  the headache does not come back. She has headaches about once per month.  Sudden left ear pressure 2 days ago that lasted a few seconds. No recent URI or travel. No changes in hearing and no history of trauma.  Review of Systems  Constitutional:  Positive for fatigue. Negative for activity change and chills.  HENT:  Negative for mouth sores and sore throat.   Respiratory:  Negative for cough and shortness of breath.   Gastrointestinal:  Negative for nausea and vomiting.  Genitourinary:  Positive for dysuria, frequency, hesitancy and urgency. Negative for flank pain and hematuria.  Neurological:  Negative for syncope, facial asymmetry, weakness and numbness.  Psychiatric/Behavioral:  Negative for confusion and hallucinations.   See other pertinent positives and negatives in HPI.  Current Outpatient Medications on File Prior to Visit  Medication Sig Dispense Refill   estradiol  (VIVELLE -DOT) 0.05 MG/24HR patch Place 1 patch (0.05 mg total) onto the skin 2 (two) times a week. 24 patch 3   Multiple Vitamins-Minerals (MULTIVITAMIN WITH MINERALS) tablet Take 1 tablet by mouth daily.     VITAMIN D  PO Take by mouth.     No current facility-administered medications on file prior to visit.  Past Medical History:  Diagnosis Date   Cervical adenocarcinoma (HCC)    History of abnormal cervical Pap smear    w/ cryoablation in 2000   Varicose veins    No Known Allergies  Social History   Socioeconomic History   Marital status: Married    Spouse name: Not on file   Number of children: 4   Years of education: Not on file   Highest education level: Not on file  Occupational History   Not on file  Tobacco Use   Smoking status: Never    Passive exposure: Never    Smokeless tobacco: Never  Vaping Use   Vaping status: Never Used  Substance and Sexual Activity   Alcohol use: No   Drug use: No   Sexual activity: Yes    Partners: Male    Birth control/protection: Surgical    Comment: 1st intercourse- 18, partner- 1, hysterectomy  Other Topics Concern   Not on file  Social History Narrative   Not on file   Social Drivers of Health   Financial Resource Strain: Not on file  Food Insecurity: No Food Insecurity (12/30/2023)   Hunger Vital Sign    Worried About Running Out of Food in the Last Year: Never true    Ran Out of Food in the Last Year: Never true  Transportation Needs: No Transportation Needs (12/30/2023)   PRAPARE - Administrator, Civil Service (Medical): No    Lack of Transportation (Non-Medical): No  Physical Activity: Not on file  Stress: Not on file  Social Connections: Not on file    Vitals:   06/23/24 1324  BP: 110/60  Pulse: 83  Resp: 16  Temp: 97.9 F (36.6 C)  SpO2: 97%   Body mass index is 29.22 kg/m.  Physical Exam Vitals and nursing note reviewed.  Constitutional:      General: She is not in acute distress.    Appearance: She is well-developed.  HENT:     Head: Normocephalic and atraumatic.     Right Ear: Tympanic membrane, ear canal and external ear normal.     Left Ear: External ear normal. Tympanic membrane is not erythematous.     Ears:     Comments: Excess cerumen in left ear canal, TM seen partially.    Mouth/Throat:     Mouth: Mucous membranes are moist.     Pharynx: Oropharynx is clear.     Comments: Right hypertrophic tonsil. Eyes:     Conjunctiva/sclera: Conjunctivae normal.     Funduscopic exam:    Right eye: No hemorrhage, exudate or papilledema.        Left eye: No hemorrhage, exudate or papilledema.  Neck:   Cardiovascular:     Rate and Rhythm: Normal rate and regular rhythm.  Pulmonary:     Effort: Pulmonary effort is normal. No respiratory distress.     Breath  sounds: Normal breath sounds.  Abdominal:     Palpations: Abdomen is soft. There is no mass.     Tenderness: There is abdominal tenderness (mild) in the suprapubic area. There is no right CVA tenderness, left CVA tenderness, guarding or rebound.  Musculoskeletal:     Cervical back: Spasms and tenderness present. Muscular tenderness present. No pain with movement.     Lumbar back: Tenderness present. No bony tenderness.       Back:  Lymphadenopathy:     Cervical: No cervical adenopathy.  Skin:    General: Skin is warm.  Findings: No erythema.  Neurological:     General: No focal deficit present.     Mental Status: She is alert and oriented to person, place, and time.     Motor: No tremor or pronator drift.     Gait: Gait normal.     Deep Tendon Reflexes:     Reflex Scores:      Patellar reflexes are 2+ on the right side and 2+ on the left side. Psychiatric:        Mood and Affect: Mood and affect normal.    ASSESSMENT AND PLAN:  Krista Cross was seen today for urinary symptoms and headache.  Dysuria We discussed possible etiologies. Urine dipstick today with leukocytes 2+ and blood 2+. We will follow urine culture.  -     POCT urinalysis dipstick -     Urine Culture  Urinary tract infection without hematuria, site unspecified Empiric treatment with Macrobid 100 mg twice daily for 5 days started today. Monitor for new symptoms. Continue adequate hydration. Will tailor treatment according to urine culture and sensitivity result. Instructed about warning signs.  -     Nitrofurantoin Monohyd Macro; Take 1 capsule (100 mg total) by mouth 2 (two) times daily for 5 days.  Dispense: 10 capsule; Refill: 0  Headache, unspecified headache type We discussed possible etiologies,?  Tension headache. Today Flexeril started to treat neck pain, which could be related. I do not think imaging is needed at this time. Instructed about warning signs. Follow-up as needed.  Neck pain  on right side Recommend course of Flexeril 5 to 10 mg at bedtime for 2 weeks then daily as needed. Local massage and range of motion exercises may also help. We could consider PT if the problem is persistent. We discussed side effects of muscle relaxant.  -     Cyclobenzaprine HCl; Take 0.5-1 tablets (5-10 mg total) by mouth at bedtime.  Dispense: 30 tablet; Refill: 0  Return if symptoms worsen or fail to improve, for keep next appointment.  Domingue Coltrain G. [place], MD  [facility]. [place] office.

## 2024-06-23 NOTE — Patient Instructions (Addendum)
 A few things to remember from today's visit:  Dysuria - Plan: POCT Urinalysis Dipstick, Urine Culture  Headache, unspecified headache type  Neck pain on right side - Plan: cyclobenzaprine (FLEXERIL) 10 MG tablet  Urinary tract infection without hematuria, site unspecified - Plan: nitrofurantoin, macrocrystal-monohydrate, (MACROBID) 100 MG capsule  Tome flexeril a la hora de la came por 14 dias. Masage y movimientos de la nuca tambien pueden ayudar. Empiece el antibiotico para la orina, si le da fiebre o dolor de stomago fuerte se va para ED.  If you need refills for medications you take chronically, please call your pharmacy. Do not use My Chart to request refills or for acute issues that need immediate attention. If you send a my chart message, it may take a few days to be addressed, specially if I am not in the office.  Please be sure medication list is accurate. If a new problem present, please set up appointment sooner than planned today.

## 2024-06-23 NOTE — Telephone Encounter (Signed)
 FYI Only or Action Required?: Action required by provider: request for appointment.  Patient was last seen in primary care on 01/14/2024 by Theophilus Andrews, Tully GRADE, MD.  Called Nurse Triage reporting urinary symptoms.  Symptoms began several days ago.  Interventions attempted: Nothing.  Symptoms are: gradually worsening.  Triage Disposition: See Physician Within 24 Hours  Patient/caregiver understands and will follow disposition?: Yes    Copied from CRM #[id number]. Topic: Clinical - Red Word Triage >> Jun 23, 2024  8:11 AM Larissa RAMAN wrote: [facility] that prompted transfer to Nurse Triage: difficulty and burning with urination and headaches Answer Assessment - Initial Assessment Questions 1. SYMPTOM: What's the main symptom you're concerned about? (e.g., frequency, incontinence)     burning 2. ONSET: When did the    start?     3 days 3. PAIN: Is there any pain? If Yes, ask: How bad is it? (Scale: 1-10; mild, moderate, severe)     8 4. CAUSE: What do you think is causing the symptoms?     UTI 5. OTHER SYMPTOMS: Do you have any other symptoms? (e.g., blood in urine, fever, flank pain, pain with urination)     Frequency 6. PREGNANCY: Is there any chance you are pregnant? When was your last menstrual period?     NO  Protocols used: Urinary Symptoms-A-AH  Reason for Disposition  Urinating more frequently than usual (i.e., frequency) OR new-onset of the feeling of an urgent need to urinate (i.e., urgency)  Answer Assessment - Initial Assessment Questions 1. SYMPTOM: What's the main symptom you're concerned about? (e.g., frequency, incontinence)     burning 2. ONSET: When did the    start?     3 days 3. PAIN: Is there any pain? If Yes, ask: How bad is it? (Scale: 1-10; mild, moderate, severe)     8 4. CAUSE: What do you think is causing the symptoms?     UTI 5. OTHER SYMPTOMS: Do you have any other symptoms? (e.g., blood in urine, fever, flank pain,  pain with urination)     Frequency 6. PREGNANCY: Is there any chance you are pregnant? When was your last menstrual period?     NO  Protocols used: Urinary Symptoms-A-AH

## 2024-06-24 LAB — URINE CULTURE
MICRO NUMBER:: 17133486
SPECIMEN QUALITY:: ADEQUATE

## 2024-06-25 ENCOUNTER — Ambulatory Visit: Payer: Self-pay | Admitting: Family Medicine

## 2024-08-05 ENCOUNTER — Other Ambulatory Visit: Payer: Self-pay | Admitting: Family Medicine

## 2024-08-05 DIAGNOSIS — M542 Cervicalgia: Secondary | ICD-10-CM

## 2024-10-05 ENCOUNTER — Ambulatory Visit: Admitting: Obstetrics and Gynecology

## 2024-10-05 ENCOUNTER — Encounter: Payer: Self-pay | Admitting: Obstetrics and Gynecology

## 2024-10-05 ENCOUNTER — Other Ambulatory Visit (HOSPITAL_COMMUNITY)
Admission: RE | Admit: 2024-10-05 | Discharge: 2024-10-05 | Disposition: A | Source: Ambulatory Visit | Attending: Obstetrics and Gynecology | Admitting: Obstetrics and Gynecology

## 2024-10-05 ENCOUNTER — Other Ambulatory Visit: Payer: Self-pay

## 2024-10-05 VITALS — BP 112/68 | HR 72 | Ht <= 58 in | Wt 138.0 lb

## 2024-10-05 DIAGNOSIS — N951 Menopausal and female climacteric states: Secondary | ICD-10-CM

## 2024-10-05 DIAGNOSIS — E2839 Other primary ovarian failure: Secondary | ICD-10-CM | POA: Diagnosis not present

## 2024-10-05 DIAGNOSIS — Z8541 Personal history of malignant neoplasm of cervix uteri: Secondary | ICD-10-CM

## 2024-10-05 DIAGNOSIS — M2550 Pain in unspecified joint: Secondary | ICD-10-CM | POA: Diagnosis not present

## 2024-10-05 DIAGNOSIS — Z01419 Encounter for gynecological examination (general) (routine) without abnormal findings: Secondary | ICD-10-CM | POA: Diagnosis not present

## 2024-10-05 MED ORDER — ESTRADIOL 0.05 MG/24HR TD PTTW: 1.0000 | MEDICATED_PATCH | TRANSDERMAL | 3 refills | Status: AC

## 2024-10-05 MED ORDER — ESTRADIOL 0.05 MG/24HR TD PTTW: 1.0000 | MEDICATED_PATCH | TRANSDERMAL | 12 refills | Status: DC

## 2024-10-05 NOTE — Progress Notes (Signed)
 No questions or concerns at this time

## 2024-10-06 ENCOUNTER — Ambulatory Visit: Payer: Self-pay | Admitting: Obstetrics and Gynecology

## 2024-10-06 ENCOUNTER — Other Ambulatory Visit: Payer: Self-pay | Admitting: Obstetrics and Gynecology

## 2024-10-06 DIAGNOSIS — M79674 Pain in right toe(s): Secondary | ICD-10-CM

## 2024-10-06 DIAGNOSIS — R760 Raised antibody titer: Secondary | ICD-10-CM

## 2024-10-06 LAB — COMPREHENSIVE METABOLIC PANEL WITH GFR
AG Ratio: 1.5 (calc) (ref 1.0–2.5)
ALT: 13 U/L (ref 6–29)
AST: 17 U/L (ref 10–35)
Albumin: 4.6 g/dL (ref 3.6–5.1)
Alkaline phosphatase (APISO): 83 U/L (ref 37–153)
BUN: 15 mg/dL (ref 7–25)
CO2: 27 mmol/L (ref 20–32)
Calcium: 9.3 mg/dL (ref 8.6–10.4)
Chloride: 104 mmol/L (ref 98–110)
Creat: 0.68 mg/dL (ref 0.50–1.03)
Globulin: 3.1 g/dL (ref 1.9–3.7)
Glucose, Bld: 81 mg/dL (ref 65–99)
Potassium: 4.2 mmol/L (ref 3.5–5.3)
Sodium: 139 mmol/L (ref 135–146)
Total Bilirubin: 0.6 mg/dL (ref 0.2–1.2)
Total Protein: 7.7 g/dL (ref 6.1–8.1)
eGFR: 103 mL/min/{1.73_m2}

## 2024-10-06 LAB — LIPID PANEL
Cholesterol: 198 mg/dL
HDL: 70 mg/dL
LDL Cholesterol (Calc): 112 mg/dL — ABNORMAL HIGH
Non-HDL Cholesterol (Calc): 128 mg/dL
Total CHOL/HDL Ratio: 2.8 (calc)
Triglycerides: 69 mg/dL

## 2024-10-06 LAB — CBC
HCT: 41.7 % (ref 35.9–46.0)
Hemoglobin: 13.9 g/dL (ref 11.7–15.5)
MCH: 29.1 pg (ref 27.0–33.0)
MCHC: 33.3 g/dL (ref 31.6–35.4)
MCV: 87.4 fL (ref 81.4–101.7)
MPV: 11.2 fL (ref 7.5–12.5)
Platelets: 187 10*3/uL (ref 140–400)
RBC: 4.77 Million/uL (ref 3.80–5.10)
RDW: 11.8 % (ref 11.0–15.0)
WBC: 6.2 10*3/uL (ref 3.8–10.8)

## 2024-10-06 LAB — HEMOGLOBIN A1C
Hgb A1c MFr Bld: 5.8 % — ABNORMAL HIGH
Mean Plasma Glucose: 120 mg/dL
eAG (mmol/L): 6.6 mmol/L

## 2024-10-06 LAB — C-REACTIVE PROTEIN: CRP: 3.6 mg/L

## 2024-10-06 LAB — VITAMIN D 25 HYDROXY (VIT D DEFICIENCY, FRACTURES): Vit D, 25-Hydroxy: 49 ng/mL (ref 30–100)

## 2024-10-06 LAB — TSH: TSH: 0.84 m[IU]/L

## 2024-10-06 LAB — RHEUMATOID FACTOR: Rheumatoid fact SerPl-aCnc: <10 [IU]/mL

## 2024-10-06 MED ORDER — ESTRADIOL 0.05 MG/24HR TD PTTW: 1.0000 | MEDICATED_PATCH | TRANSDERMAL | 12 refills | Status: AC

## 2024-10-07 LAB — ANA, IFA COMPREHENSIVE PANEL
Anti Nuclear Antibody (ANA): POSITIVE — AB
ENA SM Ab Ser-aCnc: <1 AI
SM/RNP: <1 AI
SSA (Ro) (ENA) Antibody, IgG: <1 AI
SSB (La) (ENA) Antibody, IgG: <1 AI
Scleroderma (Scl-70) (ENA) Antibody, IgG: <1 AI
ds DNA Ab: 1 [IU]/mL

## 2024-10-07 LAB — ANTI-NUCLEAR AB-TITER (ANA TITER): ANA Titer 1: 1:80 {titer} — ABNORMAL HIGH

## 2024-10-07 LAB — CYTOLOGY - PAP
Comment: NEGATIVE
Diagnosis: NEGATIVE
High risk HPV: NEGATIVE

## 2024-10-08 LAB — APOLIPOPROTEIN EVALUATION
APOLIPOPROTEIN B/A1 RATIO: 0.55
Apolipoprotein A-1: 162 mg/dL
Apolipoprotein B: 89 mg/dL

## 2024-10-08 NOTE — Telephone Encounter (Signed)
 Called pt with [place] interpreter rep [phone].   Provided pt with results and Dr. Mcarthur recommendation of a referral to Rheumatology. Pt agreed to the referral, referral placed.   Pt aware the office will contact her for scheduling.

## 2024-10-13 ENCOUNTER — Ambulatory Visit: Admitting: Podiatry

## 2024-12-08 ENCOUNTER — Ambulatory Visit

## 2025-10-06 ENCOUNTER — Ambulatory Visit: Admitting: Obstetrics and Gynecology
